# Patient Record
Sex: Female | Born: 1975 | Race: White | Hispanic: No | Marital: Single | Smoking: Former smoker
Health system: Southern US, Community
[De-identification: ages and names within clinical notes are randomized; demographics above are authoritative.]

## PROBLEM LIST (undated history)

## (undated) DIAGNOSIS — J45909 Unspecified asthma, uncomplicated: Secondary | ICD-10-CM

## (undated) DIAGNOSIS — M069 Rheumatoid arthritis, unspecified: Secondary | ICD-10-CM

## (undated) DIAGNOSIS — E785 Hyperlipidemia, unspecified: Secondary | ICD-10-CM

## (undated) DIAGNOSIS — I82409 Acute embolism and thrombosis of unspecified deep veins of unspecified lower extremity: Secondary | ICD-10-CM

## (undated) DIAGNOSIS — I509 Heart failure, unspecified: Secondary | ICD-10-CM

## (undated) DIAGNOSIS — E282 Polycystic ovarian syndrome: Secondary | ICD-10-CM

## (undated) DIAGNOSIS — E119 Type 2 diabetes mellitus without complications: Secondary | ICD-10-CM

## (undated) DIAGNOSIS — M1990 Unspecified osteoarthritis, unspecified site: Secondary | ICD-10-CM

## (undated) DIAGNOSIS — E0500 Thyrotoxicosis with diffuse goiter without thyrotoxic crisis or storm: Secondary | ICD-10-CM

## (undated) DIAGNOSIS — I10 Essential (primary) hypertension: Secondary | ICD-10-CM

## (undated) HISTORY — DX: Rheumatoid arthritis, unspecified: M06.9

## (undated) HISTORY — PX: LUMBAR FUSION: SHX111

## (undated) HISTORY — DX: Hyperlipidemia, unspecified: E78.5

## (undated) HISTORY — DX: Unspecified asthma, uncomplicated: J45.909

## (undated) HISTORY — DX: Essential (primary) hypertension: I10

## (undated) HISTORY — PX: TUBAL LIGATION: SHX77

---

## 1997-03-26 ENCOUNTER — Ambulatory Visit (HOSPITAL_COMMUNITY): Admission: RE | Admit: 1997-03-26 | Discharge: 1997-03-26 | Payer: Self-pay | Admitting: Obstetrics and Gynecology

## 1997-07-13 ENCOUNTER — Inpatient Hospital Stay (HOSPITAL_COMMUNITY): Admission: AD | Admit: 1997-07-13 | Discharge: 1997-07-13 | Payer: Self-pay | Admitting: Obstetrics and Gynecology

## 1997-07-20 ENCOUNTER — Other Ambulatory Visit: Admission: RE | Admit: 1997-07-20 | Discharge: 1997-07-20 | Payer: Self-pay | Admitting: Obstetrics and Gynecology

## 1997-12-06 ENCOUNTER — Other Ambulatory Visit: Admission: RE | Admit: 1997-12-06 | Discharge: 1997-12-06 | Payer: Self-pay | Admitting: Obstetrics and Gynecology

## 1998-05-07 ENCOUNTER — Inpatient Hospital Stay (HOSPITAL_COMMUNITY): Admission: AD | Admit: 1998-05-07 | Discharge: 1998-05-07 | Payer: Self-pay | Admitting: *Deleted

## 1998-06-29 ENCOUNTER — Other Ambulatory Visit: Admission: RE | Admit: 1998-06-29 | Discharge: 1998-06-29 | Payer: Self-pay | Admitting: Obstetrics and Gynecology

## 1998-09-20 ENCOUNTER — Other Ambulatory Visit: Admission: RE | Admit: 1998-09-20 | Discharge: 1998-09-20 | Payer: Self-pay | Admitting: Obstetrics and Gynecology

## 1998-12-03 ENCOUNTER — Emergency Department (HOSPITAL_COMMUNITY): Admission: EM | Admit: 1998-12-03 | Discharge: 1998-12-03 | Payer: Self-pay | Admitting: Emergency Medicine

## 1998-12-03 ENCOUNTER — Encounter: Payer: Self-pay | Admitting: *Deleted

## 1999-03-08 ENCOUNTER — Other Ambulatory Visit: Admission: RE | Admit: 1999-03-08 | Discharge: 1999-03-08 | Payer: Self-pay | Admitting: Obstetrics and Gynecology

## 1999-03-09 ENCOUNTER — Other Ambulatory Visit: Admission: RE | Admit: 1999-03-09 | Discharge: 1999-03-09 | Payer: Self-pay | Admitting: Obstetrics and Gynecology

## 1999-03-09 ENCOUNTER — Encounter (INDEPENDENT_AMBULATORY_CARE_PROVIDER_SITE_OTHER): Payer: Self-pay

## 1999-05-04 ENCOUNTER — Emergency Department (HOSPITAL_COMMUNITY): Admission: EM | Admit: 1999-05-04 | Discharge: 1999-05-04 | Payer: Self-pay | Admitting: Emergency Medicine

## 1999-05-17 ENCOUNTER — Other Ambulatory Visit: Admission: RE | Admit: 1999-05-17 | Discharge: 1999-05-17 | Payer: Self-pay | Admitting: Obstetrics and Gynecology

## 1999-07-27 ENCOUNTER — Emergency Department (HOSPITAL_COMMUNITY): Admission: EM | Admit: 1999-07-27 | Discharge: 1999-07-27 | Payer: Self-pay | Admitting: *Deleted

## 2000-02-03 ENCOUNTER — Encounter: Payer: Self-pay | Admitting: Emergency Medicine

## 2000-02-03 ENCOUNTER — Emergency Department (HOSPITAL_COMMUNITY): Admission: EM | Admit: 2000-02-03 | Discharge: 2000-02-03 | Payer: Self-pay | Admitting: Emergency Medicine

## 2000-02-05 ENCOUNTER — Other Ambulatory Visit: Admission: RE | Admit: 2000-02-05 | Discharge: 2000-02-05 | Payer: Self-pay | Admitting: Obstetrics and Gynecology

## 2000-05-17 ENCOUNTER — Other Ambulatory Visit: Admission: RE | Admit: 2000-05-17 | Discharge: 2000-05-17 | Payer: Self-pay | Admitting: Obstetrics and Gynecology

## 2000-06-26 ENCOUNTER — Encounter: Payer: Self-pay | Admitting: Physical Medicine and Rehabilitation

## 2000-06-26 ENCOUNTER — Encounter
Admission: RE | Admit: 2000-06-26 | Discharge: 2000-06-26 | Payer: Self-pay | Admitting: Physical Medicine and Rehabilitation

## 2000-10-09 ENCOUNTER — Ambulatory Visit (HOSPITAL_BASED_OUTPATIENT_CLINIC_OR_DEPARTMENT_OTHER): Admission: RE | Admit: 2000-10-09 | Discharge: 2000-10-09 | Payer: Self-pay | Admitting: *Deleted

## 2000-10-09 ENCOUNTER — Encounter (INDEPENDENT_AMBULATORY_CARE_PROVIDER_SITE_OTHER): Payer: Self-pay | Admitting: *Deleted

## 2000-10-24 ENCOUNTER — Encounter: Payer: Self-pay | Admitting: Specialist

## 2000-10-28 ENCOUNTER — Inpatient Hospital Stay (HOSPITAL_COMMUNITY): Admission: RE | Admit: 2000-10-28 | Discharge: 2000-10-31 | Payer: Self-pay | Admitting: Specialist

## 2000-10-28 ENCOUNTER — Encounter: Payer: Self-pay | Admitting: Specialist

## 2000-10-28 ENCOUNTER — Encounter (INDEPENDENT_AMBULATORY_CARE_PROVIDER_SITE_OTHER): Payer: Self-pay | Admitting: Specialist

## 2001-05-23 ENCOUNTER — Other Ambulatory Visit: Admission: RE | Admit: 2001-05-23 | Discharge: 2001-05-23 | Payer: Self-pay | Admitting: Obstetrics and Gynecology

## 2002-07-23 ENCOUNTER — Other Ambulatory Visit: Admission: RE | Admit: 2002-07-23 | Discharge: 2002-07-23 | Payer: Self-pay | Admitting: Obstetrics and Gynecology

## 2002-12-08 ENCOUNTER — Ambulatory Visit (HOSPITAL_COMMUNITY): Admission: RE | Admit: 2002-12-08 | Discharge: 2002-12-08 | Payer: Self-pay | Admitting: Family Medicine

## 2003-04-21 ENCOUNTER — Emergency Department (HOSPITAL_COMMUNITY): Admission: EM | Admit: 2003-04-21 | Discharge: 2003-04-21 | Payer: Self-pay | Admitting: Emergency Medicine

## 2003-04-29 ENCOUNTER — Other Ambulatory Visit: Admission: RE | Admit: 2003-04-29 | Discharge: 2003-04-29 | Payer: Self-pay | Admitting: Gynecology

## 2003-07-07 ENCOUNTER — Ambulatory Visit (HOSPITAL_COMMUNITY): Admission: RE | Admit: 2003-07-07 | Discharge: 2003-07-07 | Payer: Self-pay | Admitting: Gynecology

## 2003-09-10 ENCOUNTER — Inpatient Hospital Stay (HOSPITAL_COMMUNITY): Admission: AD | Admit: 2003-09-10 | Discharge: 2003-09-11 | Payer: Self-pay | Admitting: Gynecology

## 2003-10-08 ENCOUNTER — Encounter: Admission: RE | Admit: 2003-10-08 | Discharge: 2003-10-08 | Payer: Self-pay | Admitting: Gynecology

## 2003-11-15 ENCOUNTER — Encounter (INDEPENDENT_AMBULATORY_CARE_PROVIDER_SITE_OTHER): Payer: Self-pay | Admitting: Specialist

## 2003-11-15 ENCOUNTER — Inpatient Hospital Stay (HOSPITAL_COMMUNITY): Admission: RE | Admit: 2003-11-15 | Discharge: 2003-11-18 | Payer: Self-pay | Admitting: Gynecology

## 2003-12-29 ENCOUNTER — Other Ambulatory Visit: Admission: RE | Admit: 2003-12-29 | Discharge: 2003-12-29 | Payer: Self-pay | Admitting: Gynecology

## 2005-06-26 ENCOUNTER — Other Ambulatory Visit: Admission: RE | Admit: 2005-06-26 | Discharge: 2005-06-26 | Payer: Self-pay | Admitting: Gynecology

## 2005-12-03 ENCOUNTER — Ambulatory Visit (HOSPITAL_COMMUNITY): Admission: RE | Admit: 2005-12-03 | Discharge: 2005-12-03 | Payer: Self-pay | Admitting: Obstetrics and Gynecology

## 2005-12-06 ENCOUNTER — Ambulatory Visit (HOSPITAL_COMMUNITY): Admission: RE | Admit: 2005-12-06 | Discharge: 2005-12-06 | Payer: Self-pay | Admitting: Obstetrics and Gynecology

## 2005-12-10 ENCOUNTER — Ambulatory Visit (HOSPITAL_COMMUNITY): Admission: RE | Admit: 2005-12-10 | Discharge: 2005-12-10 | Payer: Self-pay | Admitting: Obstetrics and Gynecology

## 2005-12-17 ENCOUNTER — Ambulatory Visit (HOSPITAL_COMMUNITY): Admission: RE | Admit: 2005-12-17 | Discharge: 2005-12-17 | Payer: Self-pay | Admitting: Obstetrics and Gynecology

## 2005-12-20 ENCOUNTER — Ambulatory Visit (HOSPITAL_COMMUNITY): Admission: RE | Admit: 2005-12-20 | Discharge: 2005-12-20 | Payer: Self-pay | Admitting: Obstetrics and Gynecology

## 2005-12-24 ENCOUNTER — Ambulatory Visit (HOSPITAL_COMMUNITY): Admission: RE | Admit: 2005-12-24 | Discharge: 2005-12-24 | Payer: Self-pay | Admitting: Obstetrics and Gynecology

## 2005-12-27 ENCOUNTER — Ambulatory Visit (HOSPITAL_COMMUNITY): Admission: RE | Admit: 2005-12-27 | Discharge: 2005-12-27 | Payer: Self-pay | Admitting: Obstetrics and Gynecology

## 2005-12-31 ENCOUNTER — Ambulatory Visit (HOSPITAL_COMMUNITY): Admission: RE | Admit: 2005-12-31 | Discharge: 2005-12-31 | Payer: Self-pay | Admitting: Obstetrics and Gynecology

## 2006-01-03 ENCOUNTER — Ambulatory Visit (HOSPITAL_COMMUNITY): Admission: RE | Admit: 2006-01-03 | Discharge: 2006-01-03 | Payer: Self-pay | Admitting: Obstetrics and Gynecology

## 2006-01-09 ENCOUNTER — Encounter (INDEPENDENT_AMBULATORY_CARE_PROVIDER_SITE_OTHER): Payer: Self-pay | Admitting: *Deleted

## 2006-01-09 ENCOUNTER — Inpatient Hospital Stay (HOSPITAL_COMMUNITY): Admission: RE | Admit: 2006-01-09 | Discharge: 2006-01-11 | Payer: Self-pay | Admitting: Obstetrics and Gynecology

## 2006-01-14 ENCOUNTER — Inpatient Hospital Stay (HOSPITAL_COMMUNITY): Admission: AD | Admit: 2006-01-14 | Discharge: 2006-01-14 | Payer: Self-pay | Admitting: Obstetrics and Gynecology

## 2007-11-10 ENCOUNTER — Emergency Department (HOSPITAL_COMMUNITY): Admission: EM | Admit: 2007-11-10 | Discharge: 2007-11-10 | Payer: Self-pay | Admitting: Emergency Medicine

## 2010-01-09 ENCOUNTER — Emergency Department (HOSPITAL_COMMUNITY)
Admission: EM | Admit: 2010-01-09 | Discharge: 2010-01-09 | Payer: Self-pay | Source: Home / Self Care | Admitting: Emergency Medicine

## 2010-02-12 ENCOUNTER — Encounter: Payer: Self-pay | Admitting: Obstetrics and Gynecology

## 2010-02-12 ENCOUNTER — Encounter: Payer: Self-pay | Admitting: Gynecology

## 2010-02-12 ENCOUNTER — Encounter: Payer: Self-pay | Admitting: Physical Medicine and Rehabilitation

## 2010-04-03 LAB — URINALYSIS, ROUTINE W REFLEX MICROSCOPIC
Hgb urine dipstick: NEGATIVE
Ketones, ur: NEGATIVE mg/dL
Nitrite: NEGATIVE
Protein, ur: NEGATIVE mg/dL
Specific Gravity, Urine: 1.02 (ref 1.005–1.030)
Urobilinogen, UA: 1 mg/dL (ref 0.0–1.0)

## 2010-04-03 LAB — BASIC METABOLIC PANEL
CO2: 25 mEq/L (ref 19–32)
Calcium: 9.1 mg/dL (ref 8.4–10.5)
Creatinine, Ser: 0.64 mg/dL (ref 0.4–1.2)
GFR calc Af Amer: >60 mL/min (ref >60)
GFR calc non Af Amer: >60 mL/min (ref >60)
Glucose, Bld: 75 mg/dL (ref 70–99)
Sodium: 140 mEq/L (ref 135–145)

## 2010-04-03 LAB — DIFFERENTIAL
Basophils Absolute: 0 10*3/uL (ref 0.0–0.1)
Basophils Relative: 0 % (ref 0–1)
Eosinophils Absolute: 0.2 10*3/uL (ref 0.0–0.7)
Eosinophils Relative: 3 % (ref 0–5)
Lymphocytes Relative: 31 % (ref 12–46)
Monocytes Relative: 9 % (ref 3–12)
Neutro Abs: 4.3 10*3/uL (ref 1.7–7.7)
Neutrophils Relative %: 57 % (ref 43–77)

## 2010-04-03 LAB — PREGNANCY, URINE: Preg Test, Ur: NEGATIVE

## 2010-04-03 LAB — CBC
Hemoglobin: 11.8 g/dL — ABNORMAL LOW (ref 12.0–15.0)
MCHC: 32 g/dL (ref 30.0–36.0)
MCV: 84.6 fL (ref 78.0–100.0)
Platelets: 314 10*3/uL (ref 150–400)
RDW: 14.3 % (ref 11.5–15.5)

## 2010-04-29 ENCOUNTER — Emergency Department (HOSPITAL_COMMUNITY): Payer: PRIVATE HEALTH INSURANCE

## 2010-04-29 ENCOUNTER — Emergency Department (HOSPITAL_COMMUNITY)
Admission: EM | Admit: 2010-04-29 | Discharge: 2010-04-30 | Disposition: A | Discharge status: Home / Self Care | Payer: PRIVATE HEALTH INSURANCE | Attending: Emergency Medicine | Admitting: Emergency Medicine

## 2010-04-29 DIAGNOSIS — R0602 Shortness of breath: Secondary | ICD-10-CM | POA: Insufficient documentation

## 2010-04-29 DIAGNOSIS — R059 Cough, unspecified: Secondary | ICD-10-CM | POA: Insufficient documentation

## 2010-04-29 DIAGNOSIS — Z86718 Personal history of other venous thrombosis and embolism: Secondary | ICD-10-CM | POA: Insufficient documentation

## 2010-04-29 DIAGNOSIS — R509 Fever, unspecified: Secondary | ICD-10-CM | POA: Insufficient documentation

## 2010-04-29 DIAGNOSIS — J3489 Other specified disorders of nose and nasal sinuses: Secondary | ICD-10-CM | POA: Insufficient documentation

## 2010-04-29 DIAGNOSIS — J40 Bronchitis, not specified as acute or chronic: Secondary | ICD-10-CM | POA: Insufficient documentation

## 2010-04-29 DIAGNOSIS — R062 Wheezing: Secondary | ICD-10-CM | POA: Insufficient documentation

## 2010-04-29 DIAGNOSIS — R0601 Orthopnea: Secondary | ICD-10-CM | POA: Insufficient documentation

## 2010-04-29 DIAGNOSIS — R05 Cough: Secondary | ICD-10-CM | POA: Insufficient documentation

## 2010-04-29 LAB — POCT I-STAT, CHEM 8
Calcium, Ion: 0.91 mmol/L — ABNORMAL LOW (ref 1.12–1.32)
HCT: 36 % (ref 36.0–46.0)
Hemoglobin: 12.2 g/dL (ref 12.0–15.0)
Potassium: 4.3 mEq/L (ref 3.5–5.1)
Sodium: 136 mEq/L (ref 135–145)
TCO2: 18 mmol/L (ref 0–100)

## 2010-06-09 NOTE — H&P (Signed)
Melissa Massey, Melissa Massey             ACCOUNT NO.:  [id number]   MEDICAL RECORD NO.:  [id number]          PATIENT TYPE:  OUT   LOCATION:  MFM                           [facility]  [facility]   PHYSICIAN:  Charles A. Delcambre, MDDATE OF BIRTH:  1975/09/12   DATE OF ADMISSION:  12/24/2005  DATE OF DISCHARGE:                              HISTORY & PHYSICAL   Former last name, Melissa Massey, for this medical record number [id number].   This patient is to be admitted on January 09, 2006, to undergo a repeat  cesarean section and bilateral tubal ligation.  She is a 35 year old  para 1-0-0-1.  EDC of January 16, 2006.  She will be at 39 weeks.  She  gives informed consent, accepts risk of infection, bleeding, bowel and  bladder damage, blood product risk including hepatitis and HIV exposure,  ureteral damage, failure rate approximately 1 in 400, for the tubal  ligation alternative forms of contraception have been discussed.   PAST MEDICAL HISTORY:  1. Positive for lupus anticoagulant.  2. Anemia.   SURGICAL HISTORY:  1. Laparoscopy.  2. Cryotherapy.  3. Low-transverse cesarean section.  4. Spinal fusion in the lumbar region L4-L6.   MEDICATIONS:  Iron once a day, baby aspirin once a day.   ALLERGIES:  NO KNOWN DRUG ALLERGIES.   SOCIAL HISTORY:  No tobacco, ethanol, or drug use.  The patient is  married and in a monogamous relationship with her husband.   FAMILY HISTORY:  Heart disease paternal grandmother, paternal  grandfathe, grandfather thyroid dysfunction.  Otherwise no major  illnesses.   REVIEW OF SYSTEMS:  Denies fever, chills, nausea, vomiting, rupture of  membranes, or bleeding.  She notes active fetal movement.  No  contractions at this time.   PHYSICAL EXAMINATION:  GENERAL:  Alert and oriented x3.  No distress.  VITAL SIGNS:  Blood pressure 128/70, respirations 18, pulse 90, weight  274 pounds.  HEENT:  Grossly within normal limits.  CORONARY:  Regular rate and rhythm.  A 2/6  systolic ejection murmur left  sternal border.  LUNGS:  Clear.  ABDOMEN:  Obese.  Fundal height 39-cm at 37 weeks.  Fetal heart rate  145.  PELVIC:  Deferred.  EXTREMITIES:  Nontender.   ASSESSMENT:  The patient to be admitted at 39 weeks to undergo a repeat  cesarean section and tubal ligation for sterilization request and with a  history of cesarean section.   PLAN:  1. NPO past midnight evening prior to surgery.  2. Preoperative CBC and type and screen.  3. Informed consent as noted above.  4. We will proceed as outlined.      Charles A. Sydnee Cabal, MD  Electronically Signed     CAD/MEDQ  D:  12/25/2005  T:  12/25/2005  Job:  [id number]

## 2010-06-09 NOTE — Op Note (Signed)
Melissa Massey, Melissa Massey             ACCOUNT NO.:  [id number]   MEDICAL RECORD NO.:  [id number]          PATIENT TYPE:  OUT   LOCATION:  MFM                           [facility]   PHYSICIAN:  Charles A. Delcambre, MDDATE OF BIRTH:  Jan 28, 1975   DATE OF PROCEDURE:  01/09/2006  DATE OF DISCHARGE:                               OPERATIVE REPORT   PREOPERATIVE DIAGNOSIS:  1. Previous stent cesarean section.  2. Desired repeat cesarean section.  3. Undesired fertility.  4. Desires sterilization.   PROCEDURE:  1. Repeat low transverse section.  2. Modified [place] bilateral tubal ligation.   SURGEON:  Charles A. Sydnee Cabal, M.D.   ASSISTANT:  Gerald Leitz, M.D.   COMPLICATIONS:  None.   BLOOD LOSS:  800 mL.   ANESTHESIA:  Spinal.   SPECIMEN:  1. Portion of right left fallopian tubes to pathology.  2. Placenta to pathology secondary to positive lupus anticoagulant      history.   FINDINGS:  Vigorous female, Apgars 9 and 9.  Normal-appearing uterus and  placenta, uterus and the tubes and ovaries and placenta grossly.  Instrument, sponge, needle count correct x2.   DESCRIPTION OF PROCEDURE:  The patient was taken to the operating room  and placed supine position. After spinal was induced and anesthesia was  adequate, sterile prep and drape was undertaken. A sterile incision was  made with not carried down to fascia.  Fascia was incised with a knife  and Mayo scissors. Peritoneum was entered at this time without damage to  surrounding structures and opening the fascia.  Fascia was taken  superiorly carefully without damage to surrounding structures. There was  noted to be omental adhesions to the anterior abdominal wall and these  were taken down sharply and then tied with 0 Vicryl ties without damage  to surrounding structures.  Hemostasis was good.  This allowed better  exposure down to the lower uterine segment and the fascia was taken  inferiorly as well. Bladder was separated  with the rectus muscles  diastased and there was no damage to the bladder noted.  Bladder blade  was placed.  The vesicouterine peritoneum was incised with the  Metzenbaum scissors.  Sharp dissection with Metzenbaum scissors was used  to take the bladder down to the bladder flap. Bladder blade was  replaced.  Lower uterine segment transverse incision was made to  amniotomy without damage to infant.  Traction was used to extend the  incision. Clear amniotic fluid was noted. Head was noted to be obliquely  over to the right and pressure was used to push head down.  Vacuum  extractor was used to assist delivery easily with fundal pressure by the  operator's assistant. The baby was delivered without further difficulty  and cord was clamped and infant was shown to parents and handed off to  neonatologist, Dr. Eric Form, who was in attendance.  Placenta was manually  expressed and sent off for cord blood to be drawn and to go to  pathology.  The lower uterine segment was closed with a single layer  with #1 chromic and there was very  thin lower uterine segment.  This was  closed further with transverse interrupted figure-of-eight sutures  without encroaching on the bladder to close this very thin area inferior  to the incision. Hemostasis was adequate and the defect was closed  adequately. No evidence of damage to the bladder was noted again and  sutures did not encroach upon the bladder.  Fallopian tubes were  isolated and clearly seen on the left and right mid segment. The isthmic  and ampullary portion was dilated doubly ligated with 0 plain gut and  tubal segment was excised.  Hemostasis was adequate on both sides and  these segments were sent separately to pathology.  The pedicles were  verified to be of good hemostasis.  Irrigation was carried out and the  uterine incision was with good hemostasis. 0 Vicryl was used to close  the rectus muscles to the midline from the diastases in two simple   stitches.  Subfascial hemostasis was good. Fascia was closed with #1  Vicryl running nonlocking suture.  Subcutaneous hemostasis was  excellent.  Irrigation was carried out. Sterile skin clips were used to  close the skin.  Sterile dressing was applied. The patient tolerated  procedure well and was taken to recovery with physician in attendance.      Charles A. Sydnee Cabal, MD  Electronically Signed     CAD/MEDQ  D:  01/09/2006  T:  01/09/2006  Job:  [id number]

## 2010-06-09 NOTE — Op Note (Signed)
NAMEKRYSTALLE, Melissa Massey          ACCOUNT NO.:  [id number]   MEDICAL RECORD NO.:  [id number]          PATIENT TYPE:  INP   LOCATION:  9125                          [facility]ILITY:  [facility]   PHYSICIAN:  Ivor Costa. Farrel Gobble, M.D. DATE OF BIRTH:  01/05/1976   DATE OF PROCEDURE:  11/15/2003  DATE OF DISCHARGE:                                 OPERATIVE REPORT   PREOPERATIVE DIAGNOSES:  39 week intrauterine pregnancy, positive lupus  anticoagulant, history of spinal fusion at the lumbar level.   POSTOPERATIVE DIAGNOSES:  39 week intrauterine pregnancy, positive lupus  anticoagulant, history of spinal fusion at the lumbar level.   PROCEDURE:  Elective primary cesarean section, low flap transverse.   SURGEON:  Ivor Costa. Farrel Gobble, M.D.   ASSISTANT:  Gaetano Hawthorne. Lily Peer, M.D.   ANESTHESIA:  Epidural, spinal.   IV FLUIDS:  3500 mL of lactated Ringer's solution.   ESTIMATED BLOOD LOSS:  450 mL.   FINDINGS:  Viable female in the vertex presentation, clear amniotic fluid.  Apgar's 5, 8. Birth weight 7 pounds 9 ounces, normal uterus, tubes and  ovaries.   COMPLICATIONS:  None.   PATHOLOGY:  Placenta.   DESCRIPTION OF PROCEDURE:  The patient was taken to the operating room,  epidural and then spinal anesthesia was placed and once adequate anesthesia  was ensured prepped and draped in the usual sterile fashion. A Pfannenstiel  skin incision was made with the scalpel and carried through the underlying  layering of fascia with electrocautery. The fascia was scored in the midline  and inferior aspect of the fascial incision was grasped with Kocher's and  underlying rectus muscles were dissected off by blunt and sharp dissection.  In a similar fashion, the superior aspect of the incision was grasped with  Kocher's and underlying rectus muscles were dissected off. The rectus  muscles were separated in the midline, the peritoneum was identified and  entered bluntly. The peritoneal incision was then extended  superiorly and  inferiorly with good visualization of the underlying bowel and bladder.  The  bladder blade was inserted, the vesicouterine perineum was identified,  tinted up and entered sharply with the Metzenbaum's. The incision was  extended laterally, the bladder flap was created digitally, the bladder  blade was then reinserted and the lower uterine segment was incised in a  transverse fashion with the scalpel. He was noted to be markedly thin. The  incision was extended bluntly and amniotomy was performed for copious  amounts of clear amniotic fluid. The baby was delivered with the aid of baby  Elliot's, cord was clamped and cut and handed off to the waiting  pediatrician, the loose nuchal cord was reduced during delivery.  Cord  bloods were obtained, the uterus was massaged, the placenta was allowed to  separate naturally. The uterus was then cleared of all clots and debris. The  uterine incision was repaired with a running locked layer of #0 chromic and  noted to be hemostatic afterwards.  The pelvis was irrigated with copious  amounts of warm saline. Reinspection of the incision, peritoneum, muscles  and fascia assured [place] of hemostasis. The fascia was  then closed with #0  Vicryl in a running fashion. The subcu was irrigated, reapproximated with 3-  0 plain, the skin was closed with 4-0 Vicryl on the [place] followed by Sinda Du-  Strips. The patient tolerated the procedure well. Sponge and sharp needle  counts were correct x2 and she was transferred to the PACU in stable  condition.     Trac   THL/MEDQ  D:  11/15/2003  T:  11/15/2003  Job:  [id number]

## 2010-06-09 NOTE — Discharge Summary (Signed)
[facility]  Patient:    Melissa Massey, Melissa Massey Visit Number: [id number] MRN: [id number]          Service Type: SUR Location: 4W 0462 01 Attending Physician:  Pierce Crane Dictated by:   Ralene Bathe, P.A.-C Admit Date:  10/28/2000 Discharge Date: 10/31/2000                             Discharge Summary  ADMISSION DIAGNOSES: 1. 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 2. Recent smoking cessation. 3. Polycystic ovary disease.  DISCHARGE DIAGNOSES: 1. 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 2. Recent smoking cessation. 3. Polycystic ovary disease. 4. Status post posterior lumbar interbody fusion with microdiskectomy at L4-5.  CONSULTATIONS:  None.  OPERATIONS:  Microdiskectomy at L4-5, posterior lumbar interbody fusion utilizing Brantagen cages, posterolateral lumbar fusion utilizing Depuy acromed pedicle screws and rods with local augmented allomatrix bone graft.  SURGEON:  Javier Docker, M.D.  ASSISTANT:  Sharolyn Douglas, M.D.  ANESTHESIA:  General.  HISTORY OF PRESENT ILLNESS:  This is a 35 year old with disk herniation at L4-5, has had a long history of severe mechanical back pain which has been disabling.  She has the above noted diagnoses per her MRI.  She has failed conservative measures, including epidural steroids, as well as therapy and NSAIDS.  At this time it is felt that fusion of the L4-5 disk space is appropriate due to her long-standing mechanical back pain refractory to conservative management.  Risks and benefits were discussed with the patient. She is in agreement and wishes to proceed.  HOSPITAL COURSE:  The patient is admitted, underwent the above named procedure, and tolerated this well.  All appropriate IV antibiotics and analgesics were utilized.  GI precautions were instituted postoperatively due to the lumbar fusion.  A tearback brace was ordered, and this  was applied when out of bed.  The patient was able to be weaned off the Foley and IV analgesics by postoperative day #2.  She tolerated being up with therapy ambulating. Ativan was instituted for smoking cessation.  All in all, the patient remained medically and orthopedically stable throughout her hospital course.  She reached all of her discharge therapy goals.  On date 10/31/00, she was afebrile, vital signs were stable.  She had a bowel movement.  She was neurovascularly intact bilateral lower extremities.  With relief of her leg pain.  Her back incision was dry.  No drainage.  At this time she was stable for discharge to home.  LABORATORY DATA:  A culture taken intraoperatively with no organisms.  CBC postoperatively was stable with hemoglobin of 11.7, white count 11.5, hemoglobin preoperatively was 15.3.  Urinalysis on admission showed trace ketones, otherwise normal.  Blood type was O+.  Surgical pathology shows disk fragment.  Radiology postoperatively shows a L4-5 fusion.  Chest x-ray preoperatively shows no active disease on 10/24/00.  Cardiology not done due to the patients age.  CONDITION ON DISCHARGE:  Stable and improved.  DISCHARGE PLANS: 1. The patient is being discharged to home.  She will wear the brace when up.    She may sit to ______ brace. 2. She may shower tomorrow in the brace.  DISCHARGE MEDICATIONS: 1. Percocet 5/325 mg #50 one or two q.4-6h. p.r.n. pain. 2. Robaxin 500 mg #30 one q.8h. p.r.n. spasm. 3. Ativan 0.5 mg #61 t.i.d. p.r.n. anxiety. 4. Resume home medications.  Avoid NSAIDS, avoid smoking.  Use suppository or stool softener at home p.r.n.  WOUND CARE:  Dry dressing change daily.  DIET:  Resume home diet. Dictated by:   Ralene Bathe, P.A.-C Attending Physician:  Pierce Crane DD:  10/31/00 TD:  10/31/00 Job: [id number]

## 2010-06-09 NOTE — Op Note (Signed)
[facility]  Patient:    Melissa Massey, Melissa Massey Visit Number: [id number] MRN: [id number]          Service Type: SUR Location: 4W 0462 01 Attending Physician:  Pierce Crane Dictated by:   Javier Docker, M.D. Proc. Date: 10/28/00 Admit Date:  10/28/2000                             Operative Report  PREOPERATIVE DIAGNOSIS:  Herniated nucleus pulposus of L4-5 and degenerative disk disease, L4-5.  POSTOPERATIVE DIAGNOSIS:  Herniated nucleus pulposus of L4-5 and degenerative disk disease, L4-5.  PROCEDURE PERFORMED:  Microdiskectomy of L4-5, posterior lumbar interbody fusion utilizing Brantigan cages, posterolateral lumbar fusion utilizing Depuy AcroMed pedicle screws and rods with local augmented with AlloMatrix ______ bone graft.  ANESTHESIA:  General.  SURGEON:  Javier Docker, M.D.  ASSISTANT:  _______.  BRIEF HISTORY AND INDICATIONS:  A 35 year old with disk herniation at 4-5. Had a long history of severe mechanical back pain, disabling.  Modic changes on her MRI.  Operative intervention was indicated for diskectomy and at the procedure, it was felt that fusion of the 4-5 disk would be appropriate due to longstanding mechanical back pain refractory to conservative treatment.  Risks and benefits were discussed including bleeding, infection, damage to vascular structures, CSF leakage, epidural fibrosis, need for adjacent segment pathology in the future, pseudoarthrosis, etc.  DESCRIPTION OF PROCEDURE:  With the patient in the supine position and after satisfactory induction of general anesthesia and 1 g of Kefzol, she was placed prone on the Wilson frame.  All bony prominences were well-padded.  The lumbar region was prepped and draped in the usual sterile fashion.  An incision was made at the spinous process of 3 to S1.  The subcutaneous tissue was dissected and electrocautery was utilized to achieve hemostasis.  The dorsal  lumbar fascia was then divided in line with the skin incision.  A Kocher was placed on the spinous process of 4 and 5, confirmed with x-ray.  Next, the paraspinous muscle was elevated from the spinous process in the lamina of 4, 5, 3, and S1.  A McCullough retractor was then placed.  The interspinous ligament was removed at 4-5.  The lamina and spinous process at 4 and 5 were skeletonized, and central decompression at 4-5 was performed, morcellizing the cancellous bone of the spinous process as well as that of the facets.  This was saved later for bone grafting.  Following the central decompression, the operating microscope was draped and brought into the surgical field.  The nerve root of 5 was gently mobilized medially and noted to be under significant tension.  Disk herniation was noted and had migrated cephalad, and there was a large fragment that had travelled cephalad beneath the 4 root up to the pedicle behind the body of 4.  This retrieved multiple fragments utilizing a combination of the nerve hook and the micropituitary.  Following retrieval of multiple fragments, the root of 4 and 5 were found to be free without evidence of compression.  Foraminotomies of 5 were performed bilaterally.  Next, with the neural elements well-retracted, an annulotomy was performed bilaterally in a rectangular fashion.  Thorough diskectomy was performed. With a spacer placed on one side at a 9.  The contralateral side was then spaced, scraped to a 9 mm utilizing the 10 scraper.  Pituitary utilized to remove the cartilage over the endplate.  A thorough  diskectomy was performed. This was done under x-ray as well.  Next, the endplates were found to be fully removed of cartilage.  The disk space was copiously irrigated.  This was also cultured due to the history of Modic changes.  Culture of Gram stain was negative.  A 9 x 9 x 21 Brantigan cage was then packed with cancellous bone as well as  augmented with ________ AlloMatrix bone graft.  This was packed into the center and the cages packed into the disk space under x-ray in satisfactory position.  The spacers were removed from the contralateral side and the cage was placed here in a similar fashion with excellent purchase.  The set again sized optimally to a 9. Bipolar electrocautery was utilized to achieve hemostasis.  Following this, the transverse processes of 4 and 5 were exposed.  The 4-5 facet was decapsulated and burred.  The pars bilaterally was burred, lamina of 4 and 5.  Next, an awl was utilized to find the pedicles of 4 and 5 at the junction of the midportion of the transverse processes and the superior facet.  This was done under x-ray.  The awl was entered, the pedicle finder utilized at appropriate conversions of 4 and 5, determined by radiographic appearance of the pedicles.  This was advanced.  A ball-tipped probe was then utilized.  The bone felt in each and all quadrants.  Measured at 35 mm of depth at 4, and 40 mm of depth at 5.  Pedicle screws were inserted after the appropriate tapping and five pedicle screws were inserted.  Excellent purchase obtained on AP and lateral.  Excellent conversions down through the pedicle as well as on the lateral.  Following this, the single rods were then placed, affixed with the set screw, and torqued to the appropriate torque.  Prior to torquing, the rods compressed on both sides.  Next, the wound was copiously irrigated.  The bone graft which was the bone putty mixed with cancellous bone was impacted into the gutters along the pars bilaterally.  Next, following this, the canal was reexamined.  The foramina of 4 and 5 was inspected.  No evidence of residual material.  There was no active bleeding. A hockey stick probe was placed, and 4 and 5 were found to be widely patent. The wound was copiously irrigated again.  Interbody cages were checked as well and they were  found to be in satisfactory position.  No evidence of active bleeding or CSF leakage.  Thrombin soaked Gelfoam was placed in the laminotomy defect.   Next, the retractors were removed, paraspinous muscles inspected, and no evidence of any active bleeding, and the area copiously irrigated.  The paraspinous musculature was reapproximated with 0 Vicryl simple sutures.  The Hemovac was placed and brought out through a distal stab wound in the skin. The dorsal lumbar fascia was reapproximated with multiple #1 Vicryl interrupted figure-of-eight stitches to a watertight closure.  The subcutaneous tissue was reapproximated with 0 and 2-0 Vicryl simple sutures. The skin was reapproximated with staples.  The wound was dressed sterilely.  The patient was placed supine on the hospital bed, extubated without difficulty, and transported to the recovery room in satisfactory condition. The patient tolerated the procedure well with no complications.  Estimated blood loss was 400 cc. Dictated by:   Javier Docker, M.D. Attending Physician:  Pierce Crane DD:  10/28/00 TD:  10/29/00 Job: [id number]

## 2010-06-09 NOTE — H&P (Signed)
NAMESHAYDA, KALKA          ACCOUNT NO.:  [id number]   MEDICAL RECORD NO.:  [id number]          PATIENT TYPE:  INP   LOCATION:  NA                            [facility]ACILITY[facility]  [facility]   PHYSICIAN:  Ivor Costa. Farrel Gobble, M.D. DATE OF BIRTH:  08/27/1975   DATE OF ADMISSION:  DATE OF DISCHARGE:                                HISTORY & PHYSICAL   CHIEF COMPLAINT:  A 39-week pregnancy for elective cesarean section.   HISTORY OF PRESENT ILLNESS:  The patient is a 35 year old G1 with a last  menstrual period of February 02, 2003, estimated date of confinement based on  a six-week ultrasound is November 22, 2003.  Estimated gestational age is 60  weeks who presents for an elective primary cesarean section secondary to a  history of a lumbar fusion from L4-S1.  After consultation with the  patient's orthopedic surgeon, he had concerns with regard to damaging the  fusion or further rupturing disks either above or below the fusion with  labor more importantly with pushing.  The patient therefore requested a  primary cesarean section.  Her pregnancy was also complicated by a false  positive RPR for which the patient was found to have a positive lupus  anticoagulant and therefore was treated with baby aspirin.  Antepartum  testing was also begun in the third trimester in the form of NST's and  AFI's, all of which have been assuring.  The patient reports good fetal  movement, no vaginal bleeding and no contractions.  She is O+, antibody  negative, RPR reactive as a false positive, Rubella immune, hepatitis B  surface antigen nonreactive, HIV nonreactive, GBS negative.  Refer to the  [facility].   PHYSICAL EXAMINATION:  GENERAL:  On physical examination, she is a well-  appearing, gravid, in no acute distress.  Her weight is 250, which is a 13  pound weight gain.  VITAL SIGNS:  Her blood pressure is 112/70.  HEART:  Her heart was a regular rate.  LUNGS:  Clear to auscultation.  ABDOMEN:  Gravid, soft,  nontender with heart tones auscultated.  VAGINA:  Vaginal exam done the week prior was long, closed and posterior.  EXTREMITIES:  Negative.  NST's were reactive.   The patient will present on the afternoon of November 15, 2003 for cesarean  section as mentioned above.     Trac   THL/MEDQ  D:  11/15/2003  T:  11/15/2003  Job:  [id number]

## 2010-06-09 NOTE — H&P (Signed)
[place]. [facility]  Patient:    Melissa Massey, Melissa Massey Visit Number: [id number] MRN: [id number]          Service Type: Attending:  Javier Docker, M.D. Dictated by:   Dorie Rank, P.A.-C. Adm. Date:  10/28/00   CC:         Stacie Acres. White, M.D. at [facility]   History and Physical  DATE OF BIRTH:  [birth year]  CHIEF COMPLAINT:  Low back pain.  HISTORY OF PRESENT ILLNESS:  Melissa Massey is a pleasant 35 year old female who has had an ongoing history of low back pain.  It has failed conservative treatment, including nonsteroidals, physical therapy, and epidural steroid injections.  She had an MRI on October 18, 2000 which revealed a right paracentral disk herniation at L4-L5 with a large extruded fragment migrated cephalad up behind the L4 vertebral body with significant mass effect on the thecal sac.  It was to the point to where it was interfering with her activities of daily living.  It was felt she would benefit from undergoing a microdiskectomy at L4-5 with posterolateral lumbar fusion.  The risks and benefits as well as the procedure were discussed with her in detail and she elected to proceed.  PAST MEDICAL HISTORY:  Significant for polycystic ovary disease.  She has had a recent lipoma removal to her right leg and upper back.  History of degenerative disk disease.  FAMILY MEDICAL DOCTOR:  Dr. Laurann Montana.  MEDICATIONS:  Percocet, Robaxin, birth control pills, Ativan p.r.n. anxiety while discontinuation of smoking.  ALLERGIES:  TROVAN.  FAMILY HISTORY:  Mother and father are both living and healthy.  She has a sister who was deceased at age 47 months with a history of a neuroblastoma. She has a brother who is autistic.  SURGICAL HISTORY:  Laparoscopy in March 2000.  September 2002 lipoma removal to the right leg and right upper back.  SOCIAL HISTORY:  She is divorced.  She is a smoker, approximately one pack per day;  however, she is currently trying to stop.  Social alcohol consumption. She plans to stay at home with her mother after her hospital stay.  She has not donated any autologous blood for this procedure.  She is employed at [employer].  REVIEW OF SYMPTOMS:  GENERAL:  No fevers, chills, night sweats, or bleeding tendencies.  PULMONARY:  No shortness of breath, productive cough, or hemoptysis.  ENDOCRINE:  No history of hyper or hypothyroidism.  No history of diabetes mellitus.  CARDIOVASCULAR:  No chest pain, angina, or orthopnea.  GU: No dysuria, hematuria, or discharge. GI:  Constipation from current pain medications.  No melena, hematemesis, diarrhea, or nausea or vomiting. NEUROLOGIC:  No seizures, headaches, or paralysis.  PHYSICAL EXAMINATION:  GENERAL:  Well-developed, well-nourished pleasant 35 year old white female, alert and oriented.  Accompanied by her mother today.  VITAL SIGNS:  Pulse 78, respirations 18, blood pressure 120/80.  HEENT:  Head is atraumatic, normocephalic.  Oropharynx is clear.  NECK:  Supple.  Negative for carotid bruits bilaterally.  No cervical lymphadenopathy appreciated on exam today.  CHEST:  She has some expiratory wheezes to the bilateral lower lobes that clears with coughing.  BREASTS:  Not pertinent to present illness.  HEART:  S1, S2.  Negative for murmur, rub, or gallop.  Heart has a regular rate and rhythm.  ABDOMEN:  Soft, nontender.  Positive bowel sounds.  GENITOURINARY:  Not pertinent to present illness.  EXTREMITIES:  She does  walk with an antalgic gait.  She has decreased range of motion with flexion-extension and lateral bending.  SKIN:  Acyanotic.  No rashes or lesions appreciated on exam.  LABORATORY DATA:  Preoperative labs and x-rays are pending at this point.  IMPRESSION:  Herniated nucleus pulposus L4-L5, degenerative disk disease.  PLAN:  The patient is scheduled for a microdiskectomy at L4-5  with posterolateral lumbar fusion with pedicle screws and interbody spacers and local versus ICBG. Dictated by:   Dorie Rank, P.A.-C. Attending:  Javier Docker, M.D. DD:  10/24/00 TD:  10/24/00 Job: [id number]

## 2010-06-09 NOTE — Op Note (Signed)
[place]. [facility]  Patient:    Melissa Massey, Melissa Massey Visit Number: [id number] MRN: [id number]          Service Type: DSU Location: [facility] Attending Physician:  Vikki Ports Dictated by:   Earna Coder, M.D. Proc. Date: 10/09/00 Admit Date:  10/09/2000                             Operative Report  PREOPERATIVE DIAGNOSIS:  Subcutaneous back mass x 3 and right thigh mass.  POSTOPERATIVE DIAGNOSIS:  Subcutaneous back mass x 3 and right thigh mass, lipomas.  OPERATION PERFORMED:  Excision of back lipoma x 3 and right thigh lipoma.  SURGEON:  Stephenie Acres, M.D.  ANESTHESIA:  MAC.  DESCRIPTION OF PROCEDURE:  The patient was taken to the operating room and placed in supine position.  After adequate MAC anesthesia was induced the right thigh was prepped and draped in normal sterile fashion.  Using 1% lidocaine with bicarb, the skin and subcutaneous tissues overlying the mass were anesthetized.  A small transverse incision was made over the mass, dissected down bluntly to what appeared to be a well-encapsulated lipoma which easily delivered itself through the wound.  It measured approximately 1 to 2 cm and was sent to pathology.  The wound was closed with subcuticular 4-0 Monocryl.  Steri-Strips were placed.  The patient was then placed in the prone position.  Back was prepped and draped in normal sterile fashion.  Again in the identical fashion, skin and subcutaneous tissue over three masses were anesthetized.  A transverse incisions were made dissected down.  All three lipomas were excised from separate incisions.  The wounds were closed with subcuticular 4-0 Monocryl. Steri-Strips were placed.  The patient tolerated the procedure well and went to PACU in good condition. Dictated by:   Earna Coder, M.D. Attending Physician:  Danna Hefty R. DD:  10/09/00 TD:  10/09/00 Job: [id number]

## 2010-06-09 NOTE — Discharge Summary (Signed)
NAMETAMINA, CYPHERS          ACCOUNT NO.:  [id number]   MEDICAL RECORD NO.:  [id number]          PATIENT TYPE:  INP   LOCATION:  9125                          FACILITY:  [facility]   PHYSICIAN:  Timothy P. Fontaine, M.D.DATE OF BIRTH:  1975/10/24   DATE OF ADMISSION:  11/15/2003  DATE OF DISCHARGE:  11/18/2003                                 DISCHARGE SUMMARY   DISCHARGE DIAGNOSES:  1.  Pregnancy at term.  2.  History of spinal fusion.   PROCEDURE:  Primary low transverse cervical cesarean section November 15, 2003.   HOSPITAL COURSE:  A 35 year old, G1, P60 female term gestation admitted for  primary cesarean section due to history of spinal fusion with orthopedic  recommendation to avoid labor.  The patient underwent an uncomplicated  primary low transverse cervical cesarean section November 15, 2003 producing  a normal female with Apgar's of 5 and 8, weight 7 pounds 9 ounces per Ivor Costa. Farrel Gobble, M.D.  The patient's postoperative course was uncomplicated and  she was discharged on postoperative day number three ambulating well  tolerating a regular diet with a hemoglobin of 11.8. The patient's blood  type is O positive, her rubella titer is positive.  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 The patient was given precaution instructions in followup to be  seen in the office in six weeks and was given a prescription for Tylox #15,  1-2 p.o. q.4-6 h.     Timo   TPF/MEDQ  D:  11/18/2003  T:  11/18/2003  Job:  [id number]

## 2010-06-09 NOTE — Discharge Summary (Signed)
NAMEWENDELL, FIEBIG             ACCOUNT NO.:  [id number]   MEDICAL RECORD NO.:  [id number]          PATIENT TYPE:  INP   LOCATION:  9146                          FACILITY:  [facility]   PHYSICIAN:  Charles A. Delcambre, MDDATE OF BIRTH:  [birth year]   DATE OF ADMISSION:  01/09/2006  DATE OF DISCHARGE:  01/11/2006                               DISCHARGE SUMMARY   DISCHARGE DIAGNOSES:  1. Intrauterine pregnancy, 39 weeks.  2. Previous cesarean section.  3. Undesired fertility.  4. Positive lupus anticoagulant.   PROCEDURE:  Repeat low transverse Cesarean section, modified Pomeroy  bilateral tubal ligation.   OPERATIVE FINDINGS:  Vigorous female, Apgars 9 and 9, 3370 g, 47 cm in  length. Normal tubes and ovaries. Placenta to pathology and tubal  segments to pathology; pathology pending.   LABORATORY DATA:  Postoperative hematocrit 27.6, hemoglobin 9.7.   HISTORY AND PHYSICAL:  As dictated on the chart.   HOSPITAL COURSE:  The patient was admitted and underwent surgery as  noted above. Postoperatively, she had routine postoperative course. She  voided without difficulty on postoperative day #1. Had some flatus  return and was given general diet. She tolerated general diet, on  postoperative day #1, she had good pain control with p.o. medications  and adamantly requested discharge home on postoperative day #2. She was  rubella equivalent, and rubella vaccine was ordered prior to discharge.  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 During the early pregnancy, she did have a TPPA  which was negative and had so in the past. TPPA was repeated on  admission and was negative. She was discharged home to follow up in the  office in three days to discontinue staples. She was prescription for  Motrin 600 mg 1 p.o. q.6h. p.r.n. #30 one refill and Hemocyte 1 p.o.  daily #30 one refill. She has a prescription for Vicodin at home that  she will  use as needed that she had before surgery for back pain with  chronic back issues and lumbar fusion. She is instructed to call for any  temperature greater than 100 degrees, increased pain or bleeding,  incisional redness or drainage. No driving for two weeks, shower okay  for two weeks, no lifting greater than 25 pounds for four weeks. All  questions were answered, and she will follow up as directed.      Charles A. Sydnee Cabal, MD  Electronically Signed     CAD/MEDQ  D:  01/11/2006  T:  01/11/2006  Job:  [id number]

## 2012-02-02 ENCOUNTER — Emergency Department (HOSPITAL_BASED_OUTPATIENT_CLINIC_OR_DEPARTMENT_OTHER)
Admission: EM | Admit: 2012-02-02 | Discharge: 2012-02-02 | Disposition: A | Discharge status: Home / Self Care | Payer: Self-pay | Attending: Emergency Medicine | Admitting: Emergency Medicine

## 2012-02-02 ENCOUNTER — Encounter (HOSPITAL_BASED_OUTPATIENT_CLINIC_OR_DEPARTMENT_OTHER): Payer: Self-pay

## 2012-02-02 ENCOUNTER — Emergency Department (HOSPITAL_BASED_OUTPATIENT_CLINIC_OR_DEPARTMENT_OTHER): Payer: Self-pay

## 2012-02-02 DIAGNOSIS — Z9851 Tubal ligation status: Secondary | ICD-10-CM | POA: Insufficient documentation

## 2012-02-02 DIAGNOSIS — Z9889 Other specified postprocedural states: Secondary | ICD-10-CM | POA: Insufficient documentation

## 2012-02-02 DIAGNOSIS — R109 Unspecified abdominal pain: Secondary | ICD-10-CM | POA: Insufficient documentation

## 2012-02-02 DIAGNOSIS — F17200 Nicotine dependence, unspecified, uncomplicated: Secondary | ICD-10-CM | POA: Insufficient documentation

## 2012-02-02 DIAGNOSIS — N390 Urinary tract infection, site not specified: Secondary | ICD-10-CM | POA: Insufficient documentation

## 2012-02-02 DIAGNOSIS — Z79899 Other long term (current) drug therapy: Secondary | ICD-10-CM | POA: Insufficient documentation

## 2012-02-02 DIAGNOSIS — Z86718 Personal history of other venous thrombosis and embolism: Secondary | ICD-10-CM | POA: Insufficient documentation

## 2012-02-02 DIAGNOSIS — Z3202 Encounter for pregnancy test, result negative: Secondary | ICD-10-CM | POA: Insufficient documentation

## 2012-02-02 HISTORY — DX: Acute embolism and thrombosis of unspecified deep veins of unspecified lower extremity: I82.409

## 2012-02-02 LAB — URINE MICROSCOPIC-ADD ON

## 2012-02-02 LAB — URINALYSIS, ROUTINE W REFLEX MICROSCOPIC
Bilirubin Urine: NEGATIVE
Glucose, UA: NEGATIVE mg/dL
Ketones, ur: NEGATIVE mg/dL
Nitrite: POSITIVE — AB
Protein, ur: 30 mg/dL — AB
Specific Gravity, Urine: 1.018 (ref 1.005–1.030)
Urobilinogen, UA: 0.2 mg/dL (ref 0.0–1.0)
pH: 5 (ref 5.0–8.0)

## 2012-02-02 LAB — PREGNANCY, URINE: Preg Test, Ur: NEGATIVE

## 2012-02-02 MED ORDER — CIPROFLOXACIN HCL 500 MG PO TABS
500.0000 mg | ORAL_TABLET | Freq: Once | ORAL | Status: AC
  Administered 2012-02-02: 500 mg via ORAL
  Filled 2012-02-02: qty 1

## 2012-02-02 MED ORDER — OXYCODONE-ACETAMINOPHEN 5-325 MG PO TABS: 1.0000 | ORAL_TABLET | Freq: Four times a day (QID) | ORAL | Status: DC | PRN

## 2012-02-02 MED ORDER — CIPROFLOXACIN HCL 500 MG PO TABS: 500.0000 mg | ORAL_TABLET | Freq: Two times a day (BID) | ORAL | Status: DC

## 2012-02-02 MED ORDER — OXYCODONE-ACETAMINOPHEN 5-325 MG PO TABS
2.0000 | ORAL_TABLET | Freq: Once | ORAL | Status: AC
  Administered 2012-02-02: 2 via ORAL
  Filled 2012-02-02 (×2): qty 2

## 2012-02-02 NOTE — Discharge Instructions (Signed)
Urinary Tract Infection Urinary tract infections (UTIs) can develop anywhere along your urinary tract. Your urinary tract is your body's drainage system for removing wastes and extra water. Your urinary tract includes two kidneys, two ureters, a bladder, and a urethra. Your kidneys are a pair of bean-shaped organs. Each kidney is about the size of your fist. They are located below your ribs, one on each side of your spine. CAUSES Infections are caused by microbes, which are microscopic organisms, including fungi, viruses, and bacteria. These organisms are so small that they can only be seen through a microscope. Bacteria are the microbes that most commonly cause UTIs. SYMPTOMS  Symptoms of UTIs may vary by age and gender of the patient and by the location of the infection. Symptoms in young women typically include a frequent and intense urge to urinate and a painful, burning feeling in the bladder or urethra during urination. Older women and men are more likely to be tired, shaky, and weak and have muscle aches and abdominal pain. A fever may mean the infection is in your kidneys. Other symptoms of a kidney infection include pain in your back or sides below the ribs, nausea, and vomiting. DIAGNOSIS To diagnose a UTI, your caregiver will ask you about your symptoms. Your caregiver also will ask to provide a urine sample. The urine sample will be tested for bacteria and white blood cells. White blood cells are made by your body to help fight infection. TREATMENT  Typically, UTIs can be treated with medication. Because most UTIs are caused by a bacterial infection, they usually can be treated with the use of antibiotics. The choice of antibiotic and length of treatment depend on your symptoms and the type of bacteria causing your infection. HOME CARE INSTRUCTIONS  If you were prescribed antibiotics, take them exactly as your caregiver instructs you. Finish the medication even if you feel better after you  have only taken some of the medication.  Drink enough water and fluids to keep your urine clear or pale yellow.  Avoid caffeine, tea, and carbonated beverages. They tend to irritate your bladder.  Empty your bladder often. Avoid holding urine for long periods of time.  Empty your bladder before and after sexual intercourse.  After a bowel movement, women should cleanse from front to back. Use each tissue only once. SEEK MEDICAL CARE IF:   You have back pain.  You develop a fever.  Your symptoms do not begin to resolve within 3 days. SEEK IMMEDIATE MEDICAL CARE IF:   You have severe back pain or lower abdominal pain.  You develop chills.  You have nausea or vomiting.  You have continued burning or discomfort with urination. MAKE SURE YOU:   Understand these instructions.  Will watch your condition.  Will get help right away if you are not doing well or get worse. Document Released: 10/18/2004 Document Revised: 07/10/2011 Document Reviewed: 02/16/2011 ExitCare Patient Information 2013 [employer].  

## 2012-02-02 NOTE — ED Provider Notes (Signed)
History     CSN: [id number]  Arrival date & time 02/02/12  4098   First MD Initiated Contact with Patient 02/02/12 [phone]      Chief Complaint  Patient presents with  . Dysuria  . Flank Pain    right side    (Consider location/radiation/quality/duration/timing/severity/associated sxs/prior treatment) HPI Pt is otherwise healthy female who reports 3 days of dysuria, burning with urination, similar to previous UTI but not improved with home remedies including AZO and cranberry juice. She reports she began to have modecate to severe aching pain in R flank this morning, but no vomiting or fever. No vaginal bleeding or discharge Past Medical History  Diagnosis Date  . DVT (deep venous thrombosis)     Past Surgical History  Procedure Date  . Lumbar fusion   . Cesarean section     x2  . Tubal ligation     History reviewed. No pertinent family history.  History  Substance Use Topics  . Smoking status: Current Every Day Smoker -- 1.0 packs/day    Types: Cigarettes  . Smokeless tobacco: Never Used  . Alcohol Use: No    OB History    Grav Para Term Preterm Abortions TAB SAB Ect Mult Living                  Review of Systems All other systems reviewed and are negative except as noted in HPI.   Allergies  Review of patient's allergies indicates no known allergies.  Home Medications   Current Outpatient Rx  Name  Route  Sig  Dispense  Refill  . IBUPROFEN 800 MG PO TABS   Oral   Take 800 mg by mouth every 8 (eight) hours as needed.         . AZO TABS PO   Oral   Take 2 tablets by mouth 3 (three) times daily.           BP 174/95  Pulse 96  Temp 98.6 F (37 C) (Oral)  Resp 20  SpO2 100%  LMP 01/19/2012  Physical Exam  Nursing note and vitals reviewed. Constitutional: She is oriented to person, place, and time. She appears well-developed and well-nourished.  HENT:  Head: Normocephalic and atraumatic.  Eyes: EOM are normal. Pupils are equal, round, and  reactive to light.  Neck: Normal range of motion. Neck supple.  Cardiovascular: Normal rate, normal heart sounds and intact distal pulses.   Pulmonary/Chest: Effort normal and breath sounds normal.  Abdominal: Bowel sounds are normal. She exhibits no distension. There is no tenderness.  Genitourinary:       Mild R CVA tenderness  Musculoskeletal: Normal range of motion. She exhibits no edema and no tenderness.  Neurological: She is alert and oriented to person, place, and time. She has normal strength. No cranial nerve deficit or sensory deficit.  Skin: Skin is warm and dry. No rash noted.  Psychiatric: She has a normal mood and affect.    ED Course  Procedures (including critical care time)  Labs Reviewed  URINALYSIS, ROUTINE W REFLEX MICROSCOPIC - Abnormal; Notable for the following:    APPearance CLOUDY (*)     Hgb urine dipstick LARGE (*)     Protein, ur 30 (*)     Nitrite POSITIVE (*)     Leukocytes, UA LARGE (*)     All other components within normal limits  URINE MICROSCOPIC-ADD ON - Abnormal; Notable for the following:    Squamous Epithelial / LPF FEW (*)  Bacteria, UA MANY (*)     All other components within normal limits  PREGNANCY, URINE  URINE CULTURE   Ct Abdomen Pelvis Wo Contrast  02/02/2012  *RADIOLOGY REPORT*  Clinical Data: Right-sided flank pain.  Evaluate for kidney stone.  CT ABDOMEN AND PELVIS WITHOUT CONTRAST  Technique:  Multidetector CT imaging of the abdomen and pelvis was performed following the standard protocol without intravenous contrast.  Comparison: CT of the abdomen and pelvis 01/09/2010.  Findings:  Lung Bases: Unremarkable.  Abdomen/Pelvis:  There are no abnormal calcifications within the collecting system of either kidney, along the course of either ureter, or within the lumen of the urinary bladder.  No hydroureteronephrosis or perinephric stranding to suggest urinary tract obstruction at this time.  The unenhanced appearance of the visualized  liver, gallbladder, pancreas and bilateral adrenal glands is unremarkable.  Spleen is enlarged measuring 15.1 cm AP.  There are a few scattered colonic diverticula, predominately of the descending colon and sigmoid colon, with lesser involvement of the transverse colon, without surrounding inflammatory changes to suggest an acute diverticulitis at this time.  Normal appendix.  No ascites or pneumoperitoneum and no pathologic distension of small bowel.  No definite pathologic lymphadenopathy identified within the abdomen or pelvis.  The uterus and ovaries are unremarkable in appearance on this noncontrast CT examination.  Urinary bladder is nearly completely decompressed.  Musculoskeletal: There are no aggressive appearing lytic or blastic lesions noted in the visualized portions of the skeleton.  Status post PLIF that is L4-L5.  IMPRESSION: 1.  No urinary tract calculi or findings to suggest urinary tract obstruction. 2.  No acute findings in the abdomen or pelvis to account for patient's symptoms. 3.  Normal appendix. 4.  Colonic diverticulosis without findings to suggest acute diverticulitis, as above. 5.  Splenomegaly. 6.  Additional incidental findings, as above.   Original Report Authenticated By: Trudie Reed, M.D.      No diagnosis found.    MDM  Pt with TNTC WBC and RBC, will send for CT to rule out renal stone complicated by UTI. Pain medications ordered.   10:04 AM CT neg for stone, pt states she has had Cipro in the past with good results for UTI. Urine sent for culture. Advised to return for worsening.     Charles B. Bernette Mayers, MD 02/02/12 1004

## 2012-02-02 NOTE — ED Notes (Signed)
Pt states she has had dysuria, burning (constantly), for the past 3 days, and that she had onset of R flank pain this morning.  Pt states that she has been treating this at home with AZO and no relief was obtained.

## 2012-02-04 LAB — URINE CULTURE: Colony Count: >=100000

## 2012-02-05 ENCOUNTER — Telehealth (HOSPITAL_COMMUNITY): Payer: Self-pay | Admitting: Emergency Medicine

## 2012-02-05 NOTE — ED Notes (Signed)
Results received from [facility]. (+) URNC -> >/= 100,000 colonies, E Coli. Rx given in ED for Cipro  -> sensitive to the same.  Chart appended per protocol.  

## 2012-12-22 ENCOUNTER — Other Ambulatory Visit: Payer: Self-pay

## 2012-12-22 ENCOUNTER — Emergency Department (HOSPITAL_COMMUNITY): Payer: PRIVATE HEALTH INSURANCE

## 2012-12-22 ENCOUNTER — Encounter (HOSPITAL_COMMUNITY): Payer: Self-pay | Admitting: Emergency Medicine

## 2012-12-22 DIAGNOSIS — Z79899 Other long term (current) drug therapy: Secondary | ICD-10-CM | POA: Insufficient documentation

## 2012-12-22 DIAGNOSIS — H9209 Otalgia, unspecified ear: Secondary | ICD-10-CM | POA: Insufficient documentation

## 2012-12-22 DIAGNOSIS — J029 Acute pharyngitis, unspecified: Secondary | ICD-10-CM | POA: Insufficient documentation

## 2012-12-22 DIAGNOSIS — E0500 Thyrotoxicosis with diffuse goiter without thyrotoxic crisis or storm: Secondary | ICD-10-CM | POA: Insufficient documentation

## 2012-12-22 DIAGNOSIS — J209 Acute bronchitis, unspecified: Secondary | ICD-10-CM | POA: Insufficient documentation

## 2012-12-22 DIAGNOSIS — Z86718 Personal history of other venous thrombosis and embolism: Secondary | ICD-10-CM | POA: Insufficient documentation

## 2012-12-22 DIAGNOSIS — R059 Cough, unspecified: Secondary | ICD-10-CM | POA: Insufficient documentation

## 2012-12-22 DIAGNOSIS — Z87891 Personal history of nicotine dependence: Secondary | ICD-10-CM | POA: Insufficient documentation

## 2012-12-22 DIAGNOSIS — E282 Polycystic ovarian syndrome: Secondary | ICD-10-CM | POA: Insufficient documentation

## 2012-12-22 DIAGNOSIS — R609 Edema, unspecified: Secondary | ICD-10-CM | POA: Insufficient documentation

## 2012-12-22 DIAGNOSIS — I509 Heart failure, unspecified: Secondary | ICD-10-CM | POA: Insufficient documentation

## 2012-12-22 DIAGNOSIS — R05 Cough: Secondary | ICD-10-CM | POA: Insufficient documentation

## 2012-12-22 LAB — COMPREHENSIVE METABOLIC PANEL
ALT: 16 U/L (ref 0–35)
AST: 14 U/L (ref 0–37)
Albumin: 3.4 g/dL — ABNORMAL LOW (ref 3.5–5.2)
Alkaline Phosphatase: 92 U/L (ref 39–117)
CO2: 25 mEq/L (ref 19–32)
Calcium: 9.1 mg/dL (ref 8.4–10.5)
Creatinine, Ser: 0.8 mg/dL (ref 0.50–1.10)
GFR calc non Af Amer: >90 mL/min (ref >90)
Sodium: 139 mEq/L (ref 135–145)
Total Bilirubin: 0.1 mg/dL — ABNORMAL LOW (ref 0.3–1.2)
Total Protein: 6.6 g/dL (ref 6.0–8.3)

## 2012-12-22 LAB — CBC WITH DIFFERENTIAL/PLATELET
Basophils Absolute: 0 10*3/uL (ref 0.0–0.1)
Eosinophils Relative: 3 % (ref 0–5)
HCT: 34.4 % — ABNORMAL LOW (ref 36.0–46.0)
Hemoglobin: 11.7 g/dL — ABNORMAL LOW (ref 12.0–15.0)
Lymphocytes Relative: 36 % (ref 12–46)
MCH: 28.8 pg (ref 26.0–34.0)
MCV: 84.7 fL (ref 78.0–100.0)
Monocytes Absolute: 0.6 10*3/uL (ref 0.1–1.0)
Monocytes Relative: 8 % (ref 3–12)
Neutro Abs: 3.7 10*3/uL (ref 1.7–7.7)
Platelets: 260 10*3/uL (ref 150–400)
RBC: 4.06 MIL/uL (ref 3.87–5.11)
RDW: 13.7 % (ref 11.5–15.5)
WBC: 7 10*3/uL (ref 4.0–10.5)

## 2012-12-22 MED ORDER — ALBUTEROL SULFATE (5 MG/ML) 0.5% IN NEBU
5.0000 mg | INHALATION_SOLUTION | Freq: Once | RESPIRATORY_TRACT | Status: AC
  Administered 2012-12-22: 5 mg via RESPIRATORY_TRACT
  Filled 2012-12-22: qty 1

## 2012-12-22 NOTE — ED Notes (Signed)
Pt. reports SOB with dry cough for several days , denies fever or chills. No chest pain .

## 2012-12-23 ENCOUNTER — Emergency Department (HOSPITAL_COMMUNITY)
Admission: EM | Admit: 2012-12-23 | Discharge: 2012-12-23 | Disposition: A | Discharge status: Home / Self Care | Payer: PRIVATE HEALTH INSURANCE | Attending: Emergency Medicine | Admitting: Emergency Medicine

## 2012-12-23 ENCOUNTER — Encounter (HOSPITAL_COMMUNITY): Payer: Self-pay | Admitting: Emergency Medicine

## 2012-12-23 DIAGNOSIS — J209 Acute bronchitis, unspecified: Secondary | ICD-10-CM

## 2012-12-23 HISTORY — DX: Heart failure, unspecified: I50.9

## 2012-12-23 HISTORY — DX: Thyrotoxicosis with diffuse goiter without thyrotoxic crisis or storm: E05.00

## 2012-12-23 HISTORY — DX: Polycystic ovarian syndrome: E28.2

## 2012-12-23 MED ORDER — PREDNISONE 50 MG PO TABS: 50.0000 mg | ORAL_TABLET | Freq: Every day | ORAL | Status: DC

## 2012-12-23 MED ORDER — METHYLPREDNISOLONE SODIUM SUCC 125 MG IJ SOLR
125.0000 mg | Freq: Once | INTRAMUSCULAR | Status: AC
  Administered 2012-12-23: 125 mg via INTRAVENOUS
  Filled 2012-12-23: qty 2

## 2012-12-23 MED ORDER — ALBUTEROL (5 MG/ML) CONTINUOUS INHALATION SOLN
10.0000 mg/h | INHALATION_SOLUTION | Freq: Once | RESPIRATORY_TRACT | Status: AC
  Administered 2012-12-23: 10 mg/h via RESPIRATORY_TRACT
  Filled 2012-12-23: qty 20

## 2012-12-23 NOTE — Discharge Instructions (Signed)
Bronchitis °Bronchitis is the body's way of reacting to injury and/or infection (inflammation) of the bronchi. Bronchi are the air tubes that extend from the windpipe into the lungs. If the inflammation becomes severe, it may cause shortness of breath. °CAUSES  °Inflammation may be caused by: °· A virus. °· Germs (bacteria). °· Dust. °· Allergens. °· Pollutants and many other irritants. °The cells lining the bronchial tree are covered with tiny hairs (cilia). These constantly beat upward, away from the lungs, toward the mouth. This keeps the lungs free of pollutants. When these cells become too irritated and are unable to do their job, mucus begins to develop. This causes the characteristic cough of bronchitis. The cough clears the lungs when the cilia are unable to do their job. Without either of these protective mechanisms, the mucus would settle in the lungs. Then you would develop pneumonia. °Smoking is a common cause of bronchitis and can contribute to pneumonia. Stopping this habit is the single most important thing you can do to help yourself. °TREATMENT  °· Your caregiver may prescribe an antibiotic if the cough is caused by bacteria. Also, medicines that open up your airways make it easier to breathe. Your caregiver may also recommend or prescribe an expectorant. It will loosen the mucus to be coughed up. Only take over-the-counter or prescription medicines for pain, discomfort, or fever as directed by your caregiver. °· Removing whatever causes the problem (smoking, for example) is critical to preventing the problem from getting worse. °· Cough suppressants may be prescribed for relief of cough symptoms. °· Inhaled medicines may be prescribed to help with symptoms now and to help prevent problems from returning. °· For those with recurrent (chronic) bronchitis, there may be a need for steroid medicines. °SEEK IMMEDIATE MEDICAL CARE IF:  °· During treatment, you develop more pus-like mucus (purulent  sputum). °· You have a fever. °· You become progressively more ill. °· You have increased difficulty breathing, wheezing, or shortness of breath. °It is necessary to seek immediate medical care if you are elderly or sick from any other disease. °MAKE SURE YOU:  °· Understand these instructions. °· Will watch your condition. °· Will get help right away if you are not doing well or get worse. °Document Released: 01/08/2005 Document Revised: 09/10/2012 Document Reviewed: 09/02/2012 °Exit[employer]® Patient Information ©2014 [employer]. ° °

## 2012-12-23 NOTE — ED Provider Notes (Signed)
CSN: [id number]     Arrival date & time 12/22/12  2115 History   First MD Initiated Contact with Patient 12/23/12 0105     Chief Complaint  Patient presents with  . Shortness of Breath   (Consider location/radiation/quality/duration/timing/severity/associated sxs/prior Treatment) HPI Right ear discomfort, sore throat and nonproductive cough. She's had gradually worsening shortness of breath especially with exertion. She's had mild lower sugary swelling without calf pain or tenderness. Denies fevers or chills. She denies chest pain. No recent immobilization. Past Medical History  Diagnosis Date  . DVT (deep venous thrombosis)   . Graves disease   . PCOS (polycystic ovarian syndrome)   . CHF (congestive heart failure)    Past Surgical History  Procedure Laterality Date  . Lumbar fusion    . Cesarean section      x2  . Tubal ligation     History reviewed. No pertinent family history. History  Substance Use Topics  . Smoking status: Former Smoker -- 1.00 packs/day    Types: Cigarettes  . Smokeless tobacco: Never Used  . Alcohol Use: No   OB History   Grav Para Term Preterm Abortions TAB SAB Ect Mult Living                 Review of Systems  Constitutional: Negative for fever and chills.  HENT: Positive for ear pain and sore throat. Negative for sinus pressure.   Respiratory: Positive for cough and shortness of breath. Negative for chest tightness and wheezing.   Cardiovascular: Positive for leg swelling. Negative for chest pain and palpitations.  Gastrointestinal: Negative for nausea, vomiting, abdominal pain, diarrhea and constipation.  Genitourinary: Negative for dysuria and flank pain.  Musculoskeletal: Negative for back pain, myalgias, neck pain and neck stiffness.  Skin: Negative for rash and wound.  Neurological: Negative for dizziness, weakness, light-headedness, numbness and headaches.  All other systems reviewed and are negative.    Allergies  Review of  patient's allergies indicates no known allergies.  Home Medications   Current Outpatient Rx  Name  Route  Sig  Dispense  Refill  . albuterol (PROVENTIL HFA;VENTOLIN HFA) 108 (90 BASE) MCG/ACT inhaler   Inhalation   Inhale into the lungs every 6 (six) hours as needed for wheezing or shortness of breath.         . Esomeprazole Magnesium (NEXIUM PO)   Oral   Take 44.6 mg by mouth daily.         Marland Kitchen ibuprofen (ADVIL,MOTRIN) 200 MG tablet   Oral   Take 800 mg by mouth 2 (two) times daily as needed (for pain).         . metFORMIN (GLUCOPHAGE) 1000 MG tablet   Oral   Take 1,000 mg by mouth 2 (two) times daily with a meal.         . Multiple Vitamin (MULTIVITAMIN WITH MINERALS) TABS tablet   Oral   Take 1 tablet by mouth daily.         Marland Kitchen olmesartan-hydrochlorothiazide (BENICAR HCT) 40-12.5 MG per tablet   Oral   Take 1 tablet by mouth daily.          BP 139/76  Pulse 86  Temp(Src) 97.2 F (36.2 C) (Oral)  Resp 17  SpO2 97%  LMP 12/07/2012 Physical Exam  Nursing note and vitals reviewed. Constitutional: She is oriented to person, place, and time. She appears well-developed and well-nourished. No distress.  HENT:  Head: Normocephalic and atraumatic.  Mouth/Throat: Oropharynx is clear and moist.  No oropharyngeal exudate.  Right TM is bulging compared to left. Mildly erythematous posterior oropharynx without tonsillar exudate  Eyes: EOM are normal. Pupils are equal, round, and reactive to light.  Neck: Normal range of motion. Neck supple.  No meningismus  Cardiovascular: Normal rate and regular rhythm.   Pulmonary/Chest: Effort normal. No respiratory distress. She has no wheezes. She has no rales.  Mildly prolonged expiratory phase. Diminished breath sounds at bases.  Abdominal: Soft. Bowel sounds are normal. She exhibits no distension and no mass. There is no tenderness. There is no rebound and no guarding.  Musculoskeletal: Normal range of motion. She exhibits no  edema and no tenderness.  No calf swelling or tenderness. No appreciated pitting edema  Lymphadenopathy:    She has no cervical adenopathy.  Neurological: She is alert and oriented to person, place, and time.  Skin: Skin is warm and dry. No rash noted. No erythema.  Psychiatric: She has a normal mood and affect. Her behavior is normal.    ED Course  Procedures (including critical care time) Labs Review Labs Reviewed  CBC WITH DIFFERENTIAL - Abnormal; Notable for the following:    Hemoglobin 11.7 (*)    HCT 34.4 (*)    All other components within normal limits  COMPREHENSIVE METABOLIC PANEL - Abnormal; Notable for the following:    Glucose, Bld 105 (*)    Albumin 3.4 (*)    Total Bilirubin 0.1 (*)    All other components within normal limits   Imaging Review Dg Chest 2 View  12/22/2012   CLINICAL DATA:  Shortness of breath  EXAM: CHEST  2 VIEW  COMPARISON:  April 29, 2010  FINDINGS: The heart size and vascular pattern are normal. There is mild diffuse interstitial change. No consolidation or effusion. When compared to the prior study, the interstitial process is not significantly altered.  IMPRESSION: Chronic interstitial lung disease.  No acute findings.   Electronically Signed   By: Esperanza Heir M.D.   On: 12/22/2012 22:52    EKG Interpretation   None       MDM  Patient states she is feeling much better after the nebulized treatments. Her d-dimer is negative and her BNP is only very mildly elevated. I think her symptoms are likely related to bronchitis with bronchospasm. Return precautions have been given and patient voices understanding.   Loren Racer, MD 12/23/12 [phone]

## 2012-12-23 NOTE — ED Notes (Signed)
Gave pt. Husband Coke

## 2013-04-11 ENCOUNTER — Encounter (HOSPITAL_BASED_OUTPATIENT_CLINIC_OR_DEPARTMENT_OTHER): Payer: Self-pay | Admitting: Emergency Medicine

## 2013-04-11 ENCOUNTER — Emergency Department (HOSPITAL_BASED_OUTPATIENT_CLINIC_OR_DEPARTMENT_OTHER)
Admission: EM | Admit: 2013-04-11 | Discharge: 2013-04-12 | Disposition: A | Discharge status: Home / Self Care | Payer: PRIVATE HEALTH INSURANCE | Attending: Emergency Medicine | Admitting: Emergency Medicine

## 2013-04-11 DIAGNOSIS — Z87891 Personal history of nicotine dependence: Secondary | ICD-10-CM | POA: Insufficient documentation

## 2013-04-11 DIAGNOSIS — M791 Myalgia, unspecified site: Secondary | ICD-10-CM

## 2013-04-11 DIAGNOSIS — IMO0001 Reserved for inherently not codable concepts without codable children: Secondary | ICD-10-CM | POA: Insufficient documentation

## 2013-04-11 DIAGNOSIS — Z86718 Personal history of other venous thrombosis and embolism: Secondary | ICD-10-CM | POA: Insufficient documentation

## 2013-04-11 DIAGNOSIS — Z79899 Other long term (current) drug therapy: Secondary | ICD-10-CM | POA: Insufficient documentation

## 2013-04-11 DIAGNOSIS — I509 Heart failure, unspecified: Secondary | ICD-10-CM | POA: Insufficient documentation

## 2013-04-11 DIAGNOSIS — Z8639 Personal history of other endocrine, nutritional and metabolic disease: Secondary | ICD-10-CM | POA: Insufficient documentation

## 2013-04-11 DIAGNOSIS — Z862 Personal history of diseases of the blood and blood-forming organs and certain disorders involving the immune mechanism: Secondary | ICD-10-CM | POA: Insufficient documentation

## 2013-04-11 DIAGNOSIS — IMO0002 Reserved for concepts with insufficient information to code with codable children: Secondary | ICD-10-CM | POA: Insufficient documentation

## 2013-04-11 DIAGNOSIS — E282 Polycystic ovarian syndrome: Secondary | ICD-10-CM | POA: Insufficient documentation

## 2013-04-11 LAB — COMPREHENSIVE METABOLIC PANEL
ALK PHOS: 88 U/L (ref 39–117)
ALT: 28 U/L (ref 0–35)
AST: 35 U/L (ref 0–37)
Albumin: 4 g/dL (ref 3.5–5.2)
BUN: 21 mg/dL (ref 6–23)
CO2: 25 mEq/L (ref 19–32)
Calcium: 9.2 mg/dL (ref 8.4–10.5)
Chloride: 101 mEq/L (ref 96–112)
Creatinine, Ser: 1.1 mg/dL (ref 0.50–1.10)
GFR, EST AFRICAN AMERICAN: 72 mL/min — AB (ref >90)
GFR, EST NON AFRICAN AMERICAN: 62 mL/min — AB (ref >90)
Glucose, Bld: 98 mg/dL (ref 70–99)
Potassium: 3.8 mEq/L (ref 3.7–5.3)
SODIUM: 142 meq/L (ref 137–147)
Total Protein: 7.3 g/dL (ref 6.0–8.3)

## 2013-04-11 LAB — CBC WITH DIFFERENTIAL/PLATELET
BASOS PCT: 0 % (ref 0–1)
Basophils Absolute: 0 10*3/uL (ref 0.0–0.1)
Eosinophils Absolute: 0.1 10*3/uL (ref 0.0–0.7)
Eosinophils Relative: 1 % (ref 0–5)
HCT: 38.6 % (ref 36.0–46.0)
Hemoglobin: 12.9 g/dL (ref 12.0–15.0)
Lymphocytes Relative: 39 % (ref 12–46)
Lymphs Abs: 3.8 10*3/uL (ref 0.7–4.0)
MCH: 30.2 pg (ref 26.0–34.0)
MCHC: 33.4 g/dL (ref 30.0–36.0)
MCV: 90.4 fL (ref 78.0–100.0)
Monocytes Absolute: 0.7 10*3/uL (ref 0.1–1.0)
Monocytes Relative: 7 % (ref 3–12)
NEUTROS PCT: 53 % (ref 43–77)
Neutro Abs: 5.1 10*3/uL (ref 1.7–7.7)
Platelets: 365 10*3/uL (ref 150–400)
RBC: 4.27 MIL/uL (ref 3.87–5.11)
RDW: 14.6 % (ref 11.5–15.5)
WBC: 9.7 10*3/uL (ref 4.0–10.5)

## 2013-04-11 NOTE — Discharge Instructions (Signed)
Please recheck with your doctor early this week to have thyroid rechecked.

## 2013-04-11 NOTE — ED Notes (Signed)
Pt reports numbness bilat arms Hx thyroid radiation pt sent to ED by Dr Truddie Crumble endocrinologist Marcy Panning to be examined

## 2013-04-11 NOTE — ED Provider Notes (Signed)
CSN: [id number]     Arrival date & time 04/11/13  2107 History  This chart was scribed for Hilario Quarry, MD by Blanchard Kelch, ED Scribe. The patient was seen in room MH04/MH04. Patient's care was started at 10:45 PM.     Chief Complaint  Patient presents with  . Numbness     Patient is a 38 y.o. female presenting with general illness. The history is provided by the patient. No language interpreter was used.  Illness Location:  Arms bilaterally, feet Quality:  Numbness Onset quality:  Gradual Duration:  4 days Timing:  Constant Progression:  Unchanged Chronicity:  New Context:  Started on Synthroid two weeks ago   HPI Comments: AALEYAH WITHEROW is a 38 y.o. female with a history of Graves disease who presents to the Emergency Department complaining of constant bilateral numbness in her bilateral arms and feet that began four days ago. He reports associated cramping in her extremities, hands and abdomen that began after the numbness started as well as hand weakness. She states that it feels as if she is losing her grip strength and has been dropping objects. 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 She denies relief with it. She was started on Synthroid two weeks ago by her Endocrinologist, Dr. Althea Grimmer, due to [facility] level of 28. She states that she was taking a tablet every other day but just switched to a tablet everyday two days ago. She states that she is taking Benicar and HCTZ and has been taking them compliantly. She denies any recent drastic weight change. She is right hand dominant.    Past Medical History  Diagnosis Date  . DVT (deep venous thrombosis)   . Graves disease   . PCOS (polycystic ovarian syndrome)   . CHF (congestive heart failure)    Past Surgical History  Procedure Laterality Date  . Lumbar fusion    . Cesarean section      x2  . Tubal ligation      History reviewed. No pertinent family history. History  Substance Use Topics  . Smoking status: Former Smoker -- 1.00 packs/day    Types: Cigarettes  . Smokeless tobacco: Never Used  . Alcohol Use: No   OB History   Grav Para Term Preterm Abortions TAB SAB Ect Mult Living                 Review of Systems  Musculoskeletal:       Positive for cramping, generalized  Neurological: Positive for weakness and numbness.  All other systems reviewed and are negative.      Allergies  Review of patient's allergies indicates no known allergies.  Home Medications   Current Outpatient Rx  Name  Route  Sig  Dispense  Refill  . levothyroxine (SYNTHROID, LEVOTHROID) 75 MCG tablet   Oral   Take 88 mcg by mouth daily before breakfast.         . albuterol (PROVENTIL HFA;VENTOLIN HFA) 108 (90 BASE) MCG/ACT inhaler   Inhalation   Inhale into the lungs every 6 (six) hours as needed for wheezing or shortness of breath.         . Esomeprazole Magnesium (NEXIUM PO)   Oral   Take 44.6 mg by mouth daily.         Marland Kitchen ibuprofen (ADVIL,MOTRIN) 200 MG tablet   Oral   Take  800 mg by mouth 2 (two) times daily as needed (for pain).         . metFORMIN (GLUCOPHAGE) 1000 MG tablet   Oral   Take 1,000 mg by mouth 2 (two) times daily with a meal.         . Multiple Vitamin (MULTIVITAMIN WITH MINERALS) TABS tablet   Oral   Take 1 tablet by mouth daily.         Marland Kitchen olmesartan-hydrochlorothiazide (BENICAR HCT) 40-12.5 MG per tablet   Oral   Take 1 tablet by mouth daily.         . predniSONE (DELTASONE) 50 MG tablet   Oral   Take 1 tablet (50 mg total) by mouth daily.   5 tablet   0    Triage Vitals: BP 135/92  Pulse 66  Temp(Src) 97.9 F (36.6 C) (Oral)  Resp 18  SpO2 99%  Physical Exam  Nursing note and vitals reviewed. Constitutional: She is oriented to person, place, and time. She appears well-developed and well-nourished.  HENT:  Head: Normocephalic and atraumatic.   Eyes: Conjunctivae and EOM are normal. Pupils are equal, round, and reactive to light.  Neck: Normal range of motion. Neck supple.  Cardiovascular: Normal rate, regular rhythm and normal heart sounds.   Pulmonary/Chest: Effort normal and breath sounds normal. No respiratory distress. She has no wheezes. She has no rales. She exhibits no tenderness.  Abdominal: Soft. Bowel sounds are normal. There is no tenderness. There is no rebound and no guarding.  Musculoskeletal: Normal range of motion. She exhibits no edema.  Lymphadenopathy:    She has no cervical adenopathy.  Neurological: She is alert and oriented to person, place, and time.  Skin: Skin is warm and dry. No rash noted.  Psychiatric: She has a normal mood and affect.    ED Course  Procedures (including critical care time)  DIAGNOSTIC STUDIES: Oxygen Saturation is 99% on room air, normal by my interpretation.    COORDINATION OF CARE: 10:45 PM -Will order CMP and CBC. Patient verbalizes understanding and agrees with treatment plan.    Labs Review Labs Reviewed  COMPREHENSIVE METABOLIC PANEL - Abnormal; Notable for the following:    Total Bilirubin <0.2 (*)    GFR calc non Af Amer 62 (*)    GFR calc Af Amer 72 (*)    All other components within normal limits  CBC WITH DIFFERENTIAL   Imaging Review No results found.   EKG Interpretation None      MDM   Final diagnoses:  Myalgia    I personally performed the services described in this documentation, which was scribed in my presence. The recorded information has been reviewed and considered.   Hilario Quarry, MD 04/12/13 [phone]

## 2013-04-11 NOTE — ED Notes (Signed)
Attempted to draw patient's blood from her right arm and Left AC without success.  Patient verbalizes being a difficulty lab draw.  Established IV on first attempt to right hand, labs obtained.

## 2013-07-31 ENCOUNTER — Emergency Department (HOSPITAL_COMMUNITY): Payer: PRIVATE HEALTH INSURANCE

## 2013-07-31 ENCOUNTER — Encounter (HOSPITAL_COMMUNITY): Payer: Self-pay | Admitting: Emergency Medicine

## 2013-07-31 ENCOUNTER — Emergency Department (HOSPITAL_COMMUNITY)
Admission: EM | Admit: 2013-07-31 | Discharge: 2013-07-31 | Disposition: A | Discharge status: Home / Self Care | Payer: Self-pay | Attending: Emergency Medicine | Admitting: Emergency Medicine

## 2013-07-31 ENCOUNTER — Emergency Department (HOSPITAL_COMMUNITY): Payer: Self-pay

## 2013-07-31 DIAGNOSIS — Z9889 Other specified postprocedural states: Secondary | ICD-10-CM | POA: Insufficient documentation

## 2013-07-31 DIAGNOSIS — Z86718 Personal history of other venous thrombosis and embolism: Secondary | ICD-10-CM | POA: Insufficient documentation

## 2013-07-31 DIAGNOSIS — Z3202 Encounter for pregnancy test, result negative: Secondary | ICD-10-CM | POA: Insufficient documentation

## 2013-07-31 DIAGNOSIS — E0500 Thyrotoxicosis with diffuse goiter without thyrotoxic crisis or storm: Secondary | ICD-10-CM | POA: Insufficient documentation

## 2013-07-31 DIAGNOSIS — K219 Gastro-esophageal reflux disease without esophagitis: Secondary | ICD-10-CM | POA: Insufficient documentation

## 2013-07-31 DIAGNOSIS — Z79899 Other long term (current) drug therapy: Secondary | ICD-10-CM | POA: Insufficient documentation

## 2013-07-31 DIAGNOSIS — Z9851 Tubal ligation status: Secondary | ICD-10-CM | POA: Insufficient documentation

## 2013-07-31 DIAGNOSIS — R112 Nausea with vomiting, unspecified: Secondary | ICD-10-CM | POA: Insufficient documentation

## 2013-07-31 DIAGNOSIS — R1011 Right upper quadrant pain: Secondary | ICD-10-CM | POA: Insufficient documentation

## 2013-07-31 DIAGNOSIS — R1013 Epigastric pain: Secondary | ICD-10-CM | POA: Insufficient documentation

## 2013-07-31 DIAGNOSIS — I509 Heart failure, unspecified: Secondary | ICD-10-CM | POA: Insufficient documentation

## 2013-07-31 DIAGNOSIS — Z87891 Personal history of nicotine dependence: Secondary | ICD-10-CM | POA: Insufficient documentation

## 2013-07-31 LAB — URINALYSIS, ROUTINE W REFLEX MICROSCOPIC
Bilirubin Urine: NEGATIVE
Glucose, UA: NEGATIVE mg/dL
Ketones, ur: NEGATIVE mg/dL
Leukocytes, UA: NEGATIVE
Nitrite: NEGATIVE
Protein, ur: NEGATIVE mg/dL
SPECIFIC GRAVITY, URINE: 1.015 (ref 1.005–1.030)
UROBILINOGEN UA: 0.2 mg/dL (ref 0.0–1.0)
pH: 5 (ref 5.0–8.0)

## 2013-07-31 LAB — CBC WITH DIFFERENTIAL/PLATELET
Basophils Absolute: 0 10*3/uL (ref 0.0–0.1)
Basophils Relative: 0 % (ref 0–1)
EOS ABS: 0.2 10*3/uL (ref 0.0–0.7)
Eosinophils Relative: 3 % (ref 0–5)
HCT: 37.9 % (ref 36.0–46.0)
Hemoglobin: 12.8 g/dL (ref 12.0–15.0)
Lymphocytes Relative: 36 % (ref 12–46)
Lymphs Abs: 2.5 10*3/uL (ref 0.7–4.0)
MCH: 30.2 pg (ref 26.0–34.0)
MCHC: 33.8 g/dL (ref 30.0–36.0)
MCV: 89.4 fL (ref 78.0–100.0)
Monocytes Absolute: 0.5 10*3/uL (ref 0.1–1.0)
Monocytes Relative: 7 % (ref 3–12)
Neutro Abs: 3.8 10*3/uL (ref 1.7–7.7)
Neutrophils Relative %: 54 % (ref 43–77)
Platelets: 352 10*3/uL (ref 150–400)
RBC: 4.24 MIL/uL (ref 3.87–5.11)
RDW: 13.7 % (ref 11.5–15.5)
WBC: 7 10*3/uL (ref 4.0–10.5)

## 2013-07-31 LAB — COMPREHENSIVE METABOLIC PANEL
ALBUMIN: 4.2 g/dL (ref 3.5–5.2)
ALT: 24 U/L (ref 0–35)
AST: 19 U/L (ref 0–37)
Alkaline Phosphatase: 82 U/L (ref 39–117)
Anion gap: 19 — ABNORMAL HIGH (ref 5–15)
BUN: 11 mg/dL (ref 6–23)
CO2: 23 mEq/L (ref 19–32)
Calcium: 9.3 mg/dL (ref 8.4–10.5)
Chloride: 99 mEq/L (ref 96–112)
Creatinine, Ser: 0.94 mg/dL (ref 0.50–1.10)
GFR calc Af Amer: 87 mL/min — ABNORMAL LOW (ref >90)
GFR calc non Af Amer: 75 mL/min — ABNORMAL LOW (ref >90)
Glucose, Bld: 83 mg/dL (ref 70–99)
Potassium: 3.9 mEq/L (ref 3.7–5.3)
Sodium: 141 mEq/L (ref 137–147)
Total Bilirubin: <0.2 mg/dL — ABNORMAL LOW (ref 0.3–1.2)
Total Protein: 7.6 g/dL (ref 6.0–8.3)

## 2013-07-31 LAB — TROPONIN I: Troponin I: <0.3 ng/mL (ref <0.30)

## 2013-07-31 LAB — BASIC METABOLIC PANEL
Anion gap: 17 — ABNORMAL HIGH (ref 5–15)
BUN: 11 mg/dL (ref 6–23)
CO2: 25 meq/L (ref 19–32)
Calcium: 9.3 mg/dL (ref 8.4–10.5)
Chloride: 96 mEq/L (ref 96–112)
Creatinine, Ser: 0.95 mg/dL (ref 0.50–1.10)
GFR calc Af Amer: 86 mL/min — ABNORMAL LOW (ref >90)
GFR calc non Af Amer: 74 mL/min — ABNORMAL LOW (ref >90)
Glucose, Bld: 80 mg/dL (ref 70–99)
Potassium: 3.6 mEq/L — ABNORMAL LOW (ref 3.7–5.3)
Sodium: 138 mEq/L (ref 137–147)

## 2013-07-31 LAB — LIPASE, BLOOD: Lipase: 34 U/L (ref 11–59)

## 2013-07-31 LAB — URINE MICROSCOPIC-ADD ON

## 2013-07-31 LAB — PREGNANCY, URINE: Preg Test, Ur: NEGATIVE

## 2013-07-31 MED ORDER — SODIUM CHLORIDE 0.9 % IV BOLUS (SEPSIS)
1000.0000 mL | Freq: Once | INTRAVENOUS | Status: AC
  Administered 2013-07-31: 1000 mL via INTRAVENOUS

## 2013-07-31 MED ORDER — ONDANSETRON HCL 4 MG PO TABS: 4.0000 mg | ORAL_TABLET | Freq: Four times a day (QID) | ORAL | Status: DC | PRN

## 2013-07-31 MED ORDER — SUCRALFATE 1 GM/10ML PO SUSP: 1.0000 g | Freq: Three times a day (TID) | ORAL | Status: DC

## 2013-07-31 MED ORDER — FAMOTIDINE IN NACL 20-0.9 MG/50ML-% IV SOLN
20.0000 mg | Freq: Once | INTRAVENOUS | Status: AC
  Administered 2013-07-31: 20 mg via INTRAVENOUS
  Filled 2013-07-31: qty 50

## 2013-07-31 MED ORDER — SUCRALFATE 1 G PO TABS
1.0000 g | ORAL_TABLET | Freq: Once | ORAL | Status: AC
  Administered 2013-07-31: 1 g via ORAL
  Filled 2013-07-31: qty 1

## 2013-07-31 MED ORDER — ONDANSETRON HCL 4 MG/2ML IJ SOLN
4.0000 mg | Freq: Once | INTRAMUSCULAR | Status: AC
  Administered 2013-07-31: 4 mg via INTRAVENOUS
  Filled 2013-07-31: qty 2

## 2013-07-31 NOTE — ED Notes (Signed)
Pt returned from US

## 2013-07-31 NOTE — ED Provider Notes (Signed)
CSN: [id number]     Arrival date & time 07/31/13  1311 History   First MD Initiated Contact with Patient 07/31/13 1649     Chief Complaint  Patient presents with  . Abdominal Pain     (Consider location/radiation/quality/duration/timing/severity/associated sxs/prior Treatment) HPI  Melissa Massey is a 38 y.o. female  with history of GERD presenting with 1 month history of intermittent abdominal pain. Pain is in RUQ and epigastrium. Pain is post-prandial and now occurs after every meal for past 8 days. Patient reports episodes of pain radiating into the axilla and middle of back between scapulas. Pain is described as severe and sharp. No alleviating factors other than refraining from meals. Reports constant nausea for the past week that patient has self-treated with Zofran and Phenergan with good relief. Patient reports vomiting twice daily for past 8 days. She has been taking pepto-bismol daily with minimal relief. Pt reports "tan-colored, greasy" stools that alternate between solid and loose for past month. Subjective fever reported last night that resolved with motrin. No fever currently. Patient reports that her GERD is more severe recently with "burning in the chest" and excessive saliva production. She is taking Nexium for GERD. No headaches, chills, constipation or urinary issues.  Past Medical History  Diagnosis Date  . DVT (deep venous thrombosis)   . Graves disease   . PCOS (polycystic ovarian syndrome)   . CHF (congestive heart failure)    Past Surgical History  Procedure Laterality Date  . Lumbar fusion    . Cesarean section      x2  . Tubal ligation     History reviewed. No pertinent family history. History  Substance Use Topics  . Smoking status: Former Smoker -- 1.00 packs/day    Types: Cigarettes  . Smokeless tobacco: Never Used  . Alcohol Use: No   OB History   Grav Para Term Preterm Abortions TAB SAB Ect Mult Living                 Review of Systems  10  systems reviewed and found to be negative, except as noted in the HPI.   Allergies  Robitussin (alcohol free)  Home Medications   Prior to Admission medications   Medication Sig Start Date End Date Taking? Authorizing Provider  Esomeprazole Magnesium (NEXIUM PO) Take 44.6 mg by mouth daily.   Yes Historical Provider, MD  ibuprofen (ADVIL,MOTRIN) 200 MG tablet Take 800 mg by mouth 2 (two) times daily as needed (for pain).   Yes Historical Provider, MD  levothyroxine (SYNTHROID, LEVOTHROID) 88 MCG tablet Take 176-264 mcg by mouth daily before breakfast. Pat Kocher, Sat, Sun take 176 Mon, Wed, Fri, 264mg    Yes Historical Provider, MD  metFORMIN (GLUCOPHAGE) 1000 MG tablet Take 1,000 mg by mouth 2 (two) times daily with a meal.   Yes Historical Provider, MD  olmesartan-hydrochlorothiazide (BENICAR HCT) 40-25 MG per tablet Take 1 tablet by mouth daily.   Yes Historical Provider, MD  topiramate (TOPAMAX) 100 MG tablet Take 100 mg by mouth 2 (two) times daily.   Yes Historical Provider, MD  albuterol (PROVENTIL HFA;VENTOLIN HFA) 108 (90 BASE) MCG/ACT inhaler Inhale 2 puffs into the lungs every 6 (six) hours as needed for wheezing or shortness of breath.     Historical Provider, MD  ondansetron (ZOFRAN) 4 MG tablet Take 1 tablet (4 mg total) by mouth every 6 (six) hours as needed for nausea or vomiting. 07/31/13   10/01/13 Cheyan Frees, PA-C  sucralfate (CARAFATE) 1 GM/10ML  suspension Take 10 mLs (1 g total) by mouth 4 (four) times daily -  with meals and at bedtime. 07/31/13   Needham Biggins, PA-C   BP 119/68  Pulse 67  Temp(Src) 97 F (36.1 C) (Oral)  Resp 14  Ht 5\' 6"  (1.676 m)  Wt 280 lb (127.007 kg)  BMI 45.21 kg/m2  SpO2 98%  LMP 07/22/2013 Physical Exam  Nursing note and vitals reviewed. Constitutional: She is oriented to person, place, and time. She appears well-developed and well-nourished. No distress.  Obese  HENT:  Head: Normocephalic and atraumatic.  Mouth/Throat: Oropharynx is  clear and moist.  Eyes: Conjunctivae and EOM are normal. Pupils are equal, round, and reactive to light.  Cardiovascular: Normal rate, regular rhythm and intact distal pulses.   Pulmonary/Chest: Effort normal and breath sounds normal. No stridor. No respiratory distress. She has no wheezes. She has no rales. She exhibits no tenderness.  Abdominal: Soft. Bowel sounds are normal. She exhibits no distension and no mass. There is tenderness. There is no rebound and no guarding.  Tender palpation the right upper quadrant with no guarding or rebound.  Musculoskeletal: Normal range of motion. She exhibits no edema.  Neurological: She is alert and oriented to person, place, and time.  Psychiatric: She has a normal mood and affect.    ED Course  Procedures (including critical care time) Labs Review Labs Reviewed  COMPREHENSIVE METABOLIC PANEL - Abnormal; Notable for the following:    Total Bilirubin <0.2 (*)    GFR calc non Af Amer 75 (*)    GFR calc Af Amer 87 (*)    Anion gap 19 (*)    All other components within normal limits  URINALYSIS, ROUTINE W REFLEX MICROSCOPIC - Abnormal; Notable for the following:    Hgb urine dipstick TRACE (*)    All other components within normal limits  URINE MICROSCOPIC-ADD ON - Abnormal; Notable for the following:    Squamous Epithelial / LPF FEW (*)    All other components within normal limits  BASIC METABOLIC PANEL - Abnormal; Notable for the following:    Potassium 3.6 (*)    GFR calc non Af Amer 74 (*)    GFR calc Af Amer 86 (*)    Anion gap 17 (*)    All other components within normal limits  CBC WITH DIFFERENTIAL  LIPASE, BLOOD  PREGNANCY, URINE  TROPONIN I    Imaging Review Dg Chest 2 View  07/31/2013   CLINICAL DATA:  Abdominal pain.  Congestive heart failure.  EXAM: CHEST  2 VIEW  COMPARISON:  PA and lateral chest 12/22/2012.  FINDINGS: Lungs are clear. Heart size is normal. No pneumothorax or pleural effusion.  IMPRESSION: No acute disease.    Electronically Signed   By: 14/01/2012 M.D.   On: 07/31/2013 20:13   10/01/2013 Abdomen Complete  07/31/2013   CLINICAL DATA:  Abdominal pain for 1 month.  EXAM: ULTRASOUND ABDOMEN COMPLETE  COMPARISON:  CT abdomen and pelvis 02/02/2012.  FINDINGS: Gallbladder:  No gallstones or wall thickening visualized. No sonographic Murphy sign noted.  Common bile duct:  Diameter: 0.6 cm.  Liver:  No focal lesion or intrahepatic biliary ductal dilatation. The liver measures approximately 18.4 cm craniocaudal and demonstrates increased echogenicity.  IVC:  No abnormality visualized.  Pancreas:  Visualized portion unremarkable.  Spleen:  Size and appearance within normal limits.  Right Kidney:  Length: 13.9 cm. Echogenicity within normal limits. No mass or hydronephrosis visualized.  Left Kidney:  Length: 13.6 cm. Echogenicity within normal limits. No mass or hydronephrosis visualized.  Abdominal aorta:  No aneurysm visualized.  Other findings:  None.  IMPRESSION: Negative for gallstones.  No acute finding.  Hepatomegaly and fatty infiltration of the liver.   Electronically Signed   By: Drusilla Kanner M.D.   On: 07/31/2013 17:57     EKG Interpretation   Date/Time:  Friday July 31 2013 18:15:55 EDT Ventricular Rate:  69 PR Interval:  155 QRS Duration: 117 QT Interval:  438 QTC Calculation: 469 R Axis:   42 Text Interpretation:  Sinus rhythm Nonspecific intraventricular conduction  delay Low voltage, precordial leads Baseline wander in lead(s) V2 No  significant change was found Confirmed by Manus Gunning  MD, STEPHEN [phone]) on  07/31/2013 6:20:52 PM      MDM   Final diagnoses:  Postprandial abdominal pain in right upper quadrant  Nausea and vomiting in adult    Filed Vitals:   07/31/13 1645 07/31/13 1700 07/31/13 1816 07/31/13 1930  BP:  108/73 105/66 119/68  Pulse: 75 82  67  Temp:      TempSrc:      Resp: 15 17 14 14   Height:      Weight:      SpO2: 100% 100% 96% 98%    Medications  sodium  chloride 0.9 % bolus 1,000 mL (0 mLs Intravenous Stopped 07/31/13 2125)  ondansetron (ZOFRAN) injection 4 mg (4 mg Intravenous Given 07/31/13 1922)  famotidine (PEPCID) IVPB 20 mg (0 mg Intravenous Stopped 07/31/13 2125)  sucralfate (CARAFATE) tablet 1 g (1 g Oral Given 07/31/13 2123)    Melissa Massey is a 38 y.o. female presenting with postprandial abdominal pain worsening over the course of 2 months. Patient afebrile, abdominal exam is nonsurgical. No abnormality on labs. Ultrasound shows no gallstones. Patient is nontoxic, nonseptic appearing, in no apparent distress.  Patient's pain and other symptoms adequately managed in emergency department.  Fluid bolus given.  Labs, imaging and vitals reviewed.  Patient does not meet the SIRS or Sepsis criteria.  On repeat exam patient does not have a surgical abdomin and there are nor peritoneal signs.  No indication of appendicitis, bowel obstruction, bowel perforation, cholecystitis, diverticulitis.  Patient discharged home with symptomatic treatment and given strict instructions for follow-up with their primary care physician.  I have also discussed reasons to return immediately to the ER.  Patient expresses understanding and agrees with plan.   Patient has passed fluid challenge. Discussed case with the attending will recheck been asked to see if an  Improves after hydration.  Anion gap improve from 19-17. Patient tolerating by mouth amenable to discharge. Discussed followup with GI for H. pylori testing.  Evaluation does not show pathology that would require ongoing emergent intervention or inpatient treatment. Pt is hemodynamically stable and mentating appropriately. Discussed findings and plan with patient/guardian, who agrees with care plan. All questions answered. Return precautions discussed and outpatient follow up given.   New Prescriptions   ONDANSETRON (ZOFRAN) 4 MG TABLET    Take 1 tablet (4 mg total) by mouth every 6 (six) hours as needed  for nausea or vomiting.   SUCRALFATE (CARAFATE) 1 GM/10ML SUSPENSION    Take 10 mLs (1 g total) by mouth 4 (four) times daily -  with meals and at bedtime.         Wynetta Emery, PA-C 07/31/13 2145

## 2013-07-31 NOTE — Discharge Instructions (Signed)
Please follow with your primary care doctor in the next 2 days for a check-up. They must obtain records for further management.  ° °Do not hesitate to return to the Emergency Department for any new, worsening or concerning symptoms.  ° °

## 2013-07-31 NOTE — ED Notes (Signed)
Patient has on and off abdominal pain for a few months. Last Thursday she went to lunch, pain started with N/V/D.

## 2013-08-01 NOTE — ED Provider Notes (Signed)
Medical screening examination/treatment/procedure(s) were performed by non-physician practitioner and as supervising physician I was immediately available for consultation/collaboration.   EKG Interpretation   Date/Time:  Friday July 31 2013 18:15:55 EDT Ventricular Rate:  69 PR Interval:  155 QRS Duration: 117 QT Interval:  438 QTC Calculation: 469 R Axis:   42 Text Interpretation:  Sinus rhythm Nonspecific intraventricular conduction  delay Low voltage, precordial leads Baseline wander in lead(s) V2 No  significant change was found Confirmed by Manus Gunning  MD, Trinitie Mcgirr [phone]) on  07/31/2013 6:20:52 PM        Glynn Octave, MD 08/01/13 0028

## 2014-01-12 DIAGNOSIS — E890 Postprocedural hypothyroidism: Secondary | ICD-10-CM | POA: Insufficient documentation

## 2014-01-12 DIAGNOSIS — E282 Polycystic ovarian syndrome: Secondary | ICD-10-CM | POA: Insufficient documentation

## 2014-01-12 DIAGNOSIS — K219 Gastro-esophageal reflux disease without esophagitis: Secondary | ICD-10-CM | POA: Insufficient documentation

## 2014-01-12 DIAGNOSIS — Z9889 Other specified postprocedural states: Secondary | ICD-10-CM | POA: Insufficient documentation

## 2014-01-12 DIAGNOSIS — I152 Hypertension secondary to endocrine disorders: Secondary | ICD-10-CM | POA: Insufficient documentation

## 2014-01-12 DIAGNOSIS — I10 Essential (primary) hypertension: Secondary | ICD-10-CM | POA: Insufficient documentation

## 2014-03-05 DIAGNOSIS — G4733 Obstructive sleep apnea (adult) (pediatric): Secondary | ICD-10-CM | POA: Insufficient documentation

## 2014-03-05 DIAGNOSIS — G4710 Hypersomnia, unspecified: Secondary | ICD-10-CM | POA: Insufficient documentation

## 2014-03-25 DIAGNOSIS — G4733 Obstructive sleep apnea (adult) (pediatric): Secondary | ICD-10-CM | POA: Insufficient documentation

## 2014-04-09 DIAGNOSIS — K760 Fatty (change of) liver, not elsewhere classified: Secondary | ICD-10-CM | POA: Insufficient documentation

## 2014-04-09 DIAGNOSIS — R162 Hepatomegaly with splenomegaly, not elsewhere classified: Secondary | ICD-10-CM | POA: Insufficient documentation

## 2015-08-15 ENCOUNTER — Other Ambulatory Visit (HOSPITAL_COMMUNITY): Payer: Self-pay | Admitting: Otolaryngology

## 2015-08-15 DIAGNOSIS — J329 Chronic sinusitis, unspecified: Secondary | ICD-10-CM

## 2015-08-31 ENCOUNTER — Ambulatory Visit (HOSPITAL_COMMUNITY): Payer: PRIVATE HEALTH INSURANCE

## 2016-03-29 DIAGNOSIS — E0500 Thyrotoxicosis with diffuse goiter without thyrotoxic crisis or storm: Secondary | ICD-10-CM | POA: Insufficient documentation

## 2016-06-01 DIAGNOSIS — R600 Localized edema: Secondary | ICD-10-CM | POA: Insufficient documentation

## 2016-06-01 DIAGNOSIS — E1165 Type 2 diabetes mellitus with hyperglycemia: Secondary | ICD-10-CM | POA: Insufficient documentation

## 2017-08-21 DIAGNOSIS — F419 Anxiety disorder, unspecified: Secondary | ICD-10-CM

## 2017-08-21 HISTORY — DX: Anxiety disorder, unspecified: F41.9

## 2017-11-13 DIAGNOSIS — E559 Vitamin D deficiency, unspecified: Secondary | ICD-10-CM | POA: Insufficient documentation

## 2018-11-27 ENCOUNTER — Other Ambulatory Visit: Payer: Self-pay

## 2018-11-27 DIAGNOSIS — Z20822 Contact with and (suspected) exposure to covid-19: Secondary | ICD-10-CM

## 2018-11-29 LAB — NOVEL CORONAVIRUS, NAA: SARS-CoV-2, NAA: NOT DETECTED

## 2019-07-14 ENCOUNTER — Emergency Department (INDEPENDENT_AMBULATORY_CARE_PROVIDER_SITE_OTHER): Payer: 59

## 2019-07-14 ENCOUNTER — Encounter: Payer: Self-pay | Admitting: Emergency Medicine

## 2019-07-14 ENCOUNTER — Other Ambulatory Visit: Payer: Self-pay

## 2019-07-14 ENCOUNTER — Emergency Department
Admission: EM | Admit: 2019-07-14 | Discharge: 2019-07-14 | Disposition: A | Discharge status: Home / Self Care | Payer: PRIVATE HEALTH INSURANCE | Source: Home / Self Care | Attending: Family Medicine | Admitting: Family Medicine

## 2019-07-14 DIAGNOSIS — S60221A Contusion of right hand, initial encounter: Secondary | ICD-10-CM

## 2019-07-14 DIAGNOSIS — W230XXA Caught, crushed, jammed, or pinched between moving objects, initial encounter: Secondary | ICD-10-CM | POA: Diagnosis not present

## 2019-07-14 DIAGNOSIS — M79641 Pain in right hand: Secondary | ICD-10-CM

## 2019-07-14 LAB — URIC ACID: Uric Acid, Serum: 4.6 mg/dL (ref 2.5–7.0)

## 2019-07-14 MED ORDER — PREDNISONE 20 MG PO TABS
ORAL_TABLET | ORAL | 0 refills | Status: DC
Start: 2019-07-14 — End: 2020-03-26

## 2019-07-14 NOTE — ED Provider Notes (Signed)
[facility]    CSN: [id number] Arrival date & time: 07/14/19  4665      History   Chief Complaint Chief Complaint  Patient presents with  . Hand Pain    HPI BRYN Melissa Massey is a 44 y.o. female.   Patient states that she closed a pantry door on the dorsum of her right hand two days ago and had no significant pain initially, and no skin injury.  She developed soreness in her hand last night, with distinctly worse pain this morning, especially over the second MCP joint. Patient has a family history of gout (father).  The history is provided by the patient.  Hand Pain This is a new problem. The current episode started yesterday. The problem occurs constantly. The problem has been rapidly worsening. Exacerbated by: finger movement. Nothing relieves the symptoms. Treatments tried: Ibuprofen. The treatment provided no relief.    Past Medical History:  Diagnosis Date  . CHF (congestive heart failure) (HCC)   . DVT (deep venous thrombosis) (HCC)   . Graves disease   . PCOS (polycystic ovarian syndrome)     There are no problems to display for this patient.   Past Surgical History:  Procedure Laterality Date  . CESAREAN SECTION     x2  . LUMBAR FUSION    . TUBAL LIGATION      OB History   No obstetric history on file.      Home Medications    Prior to Admission medications   Medication Sig Start Date End Date Taking? Authorizing Provider  citalopram (CELEXA) 20 MG tablet Take 20 mg by mouth daily.   Yes [provider]  glipiZIDE (GLUCOTROL) 10 MG tablet Take 10 mg by mouth daily before breakfast.   Yes [provider]  hydrochlorothiazide (HYDRODIURIL) 25 MG tablet Take 25 mg by mouth daily.   Yes [provider]  levothyroxine (SYNTHROID) 100 MCG tablet Take 100 mcg by mouth daily before breakfast.   Yes [provider]  losartan (COZAAR) 100 MG tablet Take 100 mg by mouth daily.   Yes [provider]    Vitamin D, Ergocalciferol, (DRISDOL) 1.25 MG (50000 UNIT) CAPS capsule Take 50,000 Units by mouth every 7 (seven) days.   Yes [provider]  albuterol (PROVENTIL HFA;VENTOLIN HFA) 108 (90 BASE) MCG/ACT inhaler Inhale 2 puffs into the lungs every 6 (six) hours as needed for wheezing or shortness of breath.     [provider]  ibuprofen (ADVIL,MOTRIN) 200 MG tablet Take 800 mg by mouth 2 (two) times daily as needed (for pain).    [provider]  metFORMIN (GLUCOPHAGE) 1000 MG tablet Take 1,000 mg by mouth 2 (two) times daily with a meal.    [provider]  predniSONE (DELTASONE) 20 MG tablet Take one tab by mouth twice daily for 4 days, then one daily for 3 days. Take with food. 07/14/19   Lattie Haw, MD  topiramate (TOPAMAX) 100 MG tablet Take 100 mg by mouth 2 (two) times daily.    [provider]    Family History Family History  Problem Relation Age of Onset  . Stroke Mother   . Diabetes Mother   . Hypertension Father     Social History Social History   Tobacco Use  . Smoking status: Current Every Day Smoker    Packs/day: 1.00    Types: Cigarettes  . Smokeless tobacco: Never Used  Vaping Use  . Vaping Use: Every day  .  Substances: Nicotine  Substance Use Topics  . Alcohol use: No  . Drug use: No     Allergies   Robitussin (alcohol free) [guaifenesin]   Review of Systems Review of Systems  Constitutional: Negative for chills, diaphoresis, fatigue and fever.  Musculoskeletal: Positive for joint swelling.  Skin: Positive for color change. Negative for rash and wound.  All other systems reviewed and are negative.    Physical Exam Triage Vital Signs ED Triage Vitals  Enc Vitals Group     BP 07/14/19 0945 116/76     Pulse Rate 07/14/19 0945 75     Resp --      Temp 07/14/19 0945 98.7 F (37.1 C)     Temp Source 07/14/19 0945 Oral     SpO2 07/14/19 0945 98 %     Weight 07/14/19 0946 300 lb (136.1 kg)      Height 07/14/19 0946 5\' 6"  (1.676 m)     Head Circumference --      Peak Flow --      Pain Score 07/14/19 0945 6     Pain Loc --      Pain Edu? --      Excl. in [place]? --    No data found.  Updated Vital Signs BP 116/76 (BP Location: Right Arm)   Pulse 75   Temp 98.7 F (37.1 C) (Oral)   Ht 5\' 6"  (1.676 m)   Wt 136.1 kg   LMP 06/30/2019 (Approximate)   SpO2 98%   BMI 48.42 kg/m   Visual Acuity Right Eye Distance:   Left Eye Distance:   Bilateral Distance:    Right Eye Near:   Left Eye Near:    Bilateral Near:     Physical Exam Vitals and nursing note reviewed.  Constitutional:      General: She is not in acute distress. HENT:     Head: Normocephalic.  Eyes:     Pupils: Pupils are equal, round, and reactive to light.  Cardiovascular:     Rate and Rhythm: Normal rate.  Pulmonary:     Effort: Pulmonary effort is normal.  Musculoskeletal:     Right hand: Swelling, tenderness and bony tenderness present. No deformity or lacerations. Decreased range of motion. Decreased strength. Normal sensation. There is no disruption of two-point discrimination. Normal capillary refill. Normal pulse.       Hands:     Comments: Right hand has mild erythema and warmth dorsally.  There is distinct tenderness to palpation over the second MCP joint.  Skin:    General: Skin is warm and dry.  Neurological:     Mental Status: She is alert.      UC Treatments / Results  Labs (all labs ordered are listed, but only abnormal results are displayed) Labs Reviewed  URIC ACID    EKG   Radiology EXAM: RIGHT HAND - COMPLETE 3+ VIEW  COMPARISON:  None  FINDINGS: Osseous mineralization normal.  Non fused corticated/old ossicle at tip of ulnar styloid.  Joint spaces preserved.  No acute fracture, dislocation, or bone destruction.  Dorsal soft tissue swelling RIGHT hand.  IMPRESSION: No acute osseous abnormalities.   Electronically Signed   By: Lavonia Dana M.D.    On: 07/14/2019 11:19  Procedures Procedures (including critical care time)  Medications Ordered in UC Medications - No data to display  Initial Impression / Assessment and Plan / UC Course  I have reviewed the triage vital signs and the nursing notes.  Pertinent  labs & imaging results that were available during my care of the patient were reviewed by me and considered in my medical decision making (see chart for details).    Hand pain/swelling does not appear to be cellulitis.  No evidence fracture. Suspect initial episode of gout after hand contusion.  Uric acid pending. Begin prednisone burst/taper. Followup with Family Doctor if not improved in one week.    Final Clinical Impressions(s) / UC Diagnoses   Final diagnoses:  Right hand pain     Discharge Instructions     Increase fluid intake. May take Tylenol as needed for pain.    ED Prescriptions    Medication Sig Dispense Auth. Provider   predniSONE (DELTASONE) 20 MG tablet Take one tab by mouth twice daily for 4 days, then one daily for 3 days. Take with food. 11 tablet Lattie Haw, MD        Lattie Haw, MD 07/18/19 [phone]

## 2019-07-14 NOTE — ED Triage Notes (Signed)
Rt hand pain, pain started last night, closed it in the pantry door 2 days ago, but it did not hurt at the time, woke up this morning with severe pain.

## 2019-07-14 NOTE — Discharge Instructions (Addendum)
Increase fluid intake.  May take Tylenol as needed for pain. 

## 2020-02-03 DIAGNOSIS — J4542 Moderate persistent asthma with status asthmaticus: Secondary | ICD-10-CM | POA: Insufficient documentation

## 2020-03-26 ENCOUNTER — Other Ambulatory Visit: Payer: Self-pay

## 2020-03-26 ENCOUNTER — Emergency Department
Admission: EM | Admit: 2020-03-26 | Discharge: 2020-03-26 | Disposition: A | Discharge status: Home / Self Care | Payer: 59 | Source: Home / Self Care

## 2020-03-26 ENCOUNTER — Encounter: Payer: Self-pay | Admitting: Emergency Medicine

## 2020-03-26 DIAGNOSIS — J209 Acute bronchitis, unspecified: Secondary | ICD-10-CM

## 2020-03-26 DIAGNOSIS — J0140 Acute pansinusitis, unspecified: Secondary | ICD-10-CM | POA: Diagnosis not present

## 2020-03-26 MED ORDER — FLUCONAZOLE 150 MG PO TABS: ORAL_TABLET | ORAL | 0 refills | Status: DC

## 2020-03-26 MED ORDER — PREDNISONE 10 MG (21) PO TBPK: ORAL_TABLET | Freq: Every day | ORAL | 0 refills | Status: AC

## 2020-03-26 MED ORDER — AMOXICILLIN-POT CLAVULANATE 875-125 MG PO TABS: 1.0000 | ORAL_TABLET | Freq: Two times a day (BID) | ORAL | 0 refills | Status: AC

## 2020-03-26 NOTE — ED Triage Notes (Signed)
Patient states that she has another sinus infection.  Patient has recurrent infections, has polyps in her sinus, scheduled to see an ENT next month.  Patient has facial swelling, teeth hurt, congestion.  Patient has taken Ibuprofen at 4am.  Patient is vaccinated.

## 2020-03-26 NOTE — Discharge Instructions (Signed)
I have sent in Augmentin for you to take twice a day for 10 days.  I have sent in a prednisone taper for you to take for 6 days. 6 tablets on day one, 5 tablets on day two, 4 tablets on day three, 3 tablets on day four, 2 tablets on day five, and 1 tablet on day six.  I have sent in fluconazole in case of yeast. Take one tablet at the onset of symptoms. If symptoms are still present in 3 days, take the second tablet.   Follow up with this office or with primary care if symptoms are persisting.  Follow up in the ER for high fever, trouble swallowing, trouble breathing, other concerning symptoms.

## 2020-03-26 NOTE — ED Provider Notes (Signed)
[facility]   [id number] 03/26/20 Arrival Time: 0827  ZY:SAYT THROAT  SUBJECTIVE: History from: patient.  Melissa Massey is a 45 y.o. female who presents with abrupt onset of nasal congestion, headache, fatigue, sore throat, cough, purulent nasal discharge for the last week. Reports facial swelling and sinus tenderness over the last 2 days. Has hx nasal polyps and recurrent sinus infections. Has history of asthma, Graves disease, CHF, diabetes. Denies sick exposure to Covid, strep, flu or mono, or precipitating event. Has postitive history of Covid in 12/2019. Has had Covid vaccines. Has tried flonase, sinus rinses, OTC cough and cold without relief. There are no aggravating factors. Reports previous symptoms in the past.     Denies fever, chills, rhinorrhea, SOB, wheezing, chest pain, nausea, rash, changes in bowel or bladder habits.     ROS: As per HPI.  All other pertinent ROS negative.     Past Medical History:  Diagnosis Date  . CHF (congestive heart failure) (HCC)   . DVT (deep venous thrombosis) (HCC)   . Graves disease   . PCOS (polycystic ovarian syndrome)    Past Surgical History:  Procedure Laterality Date  . CESAREAN SECTION     x2  . LUMBAR FUSION    . TUBAL LIGATION     No Known Allergies No current facility-administered medications on file prior to encounter.   Current Outpatient Medications on File Prior to Encounter  Medication Sig Dispense Refill  . albuterol (PROVENTIL HFA;VENTOLIN HFA) 108 (90 BASE) MCG/ACT inhaler Inhale 2 puffs into the lungs every 6 (six) hours as needed for wheezing or shortness of breath.     . citalopram (CELEXA) 20 MG tablet Take 20 mg by mouth daily.    Marland Kitchen glipiZIDE (GLUCOTROL) 10 MG tablet Take 10 mg by mouth daily before breakfast.    . hydrochlorothiazide (HYDRODIURIL) 25 MG tablet Take 25 mg by mouth daily.    Marland Kitchen ibuprofen (ADVIL,MOTRIN) 200 MG tablet Take 800 mg by mouth 2 (two) times daily as needed (for pain).     Marland Kitchen levothyroxine (SYNTHROID) 100 MCG tablet Take 100 mcg by mouth daily before breakfast.    . losartan (COZAAR) 100 MG tablet Take 100 mg by mouth daily.    . metFORMIN (GLUCOPHAGE) 1000 MG tablet Take 1,000 mg by mouth 2 (two) times daily with a meal.    . Semaglutide,0.25 or 0.5MG /DOS, (OZEMPIC, 0.25 OR 0.5 MG/DOSE,) 2 MG/1.5ML SOPN Inject into the skin.    Marland Kitchen topiramate (TOPAMAX) 100 MG tablet Take 100 mg by mouth 2 (two) times daily.    . Vitamin D, Ergocalciferol, (DRISDOL) 1.25 MG (50000 UNIT) CAPS capsule Take 50,000 Units by mouth every 7 (seven) days.     Social History   Socioeconomic History  . Marital status: Single    Spouse name: Not on file  . Number of children: Not on file  . Years of education: Not on file  . Highest education level: Not on file  Occupational History  . Not on file  Tobacco Use  . Smoking status: Current Every Day Smoker    Packs/day: 1.00    Types: Cigarettes  . Smokeless tobacco: Never Used  Vaping Use  . Vaping Use: Every day  . Substances: Nicotine  Substance and Sexual Activity  . Alcohol use: No  . Drug use: No  . Sexual activity: Yes    Birth control/protection: Surgical  Other Topics Concern  . Not on file  Social History Narrative  . Not  on file   Social Determinants of Health   Financial Resource Strain: Not on file  Food Insecurity: Not on file  Transportation Needs: Not on file  Physical Activity: Not on file  Stress: Not on file  Social Connections: Not on file  Intimate Partner Violence: Not on file   Family History  Problem Relation Age of Onset  . Stroke Mother   . Diabetes Mother   . Hypertension Father     OBJECTIVE:  Vitals:   03/26/20 0842  BP: (!) 148/87  Pulse: 78  Temp: 99.1 F (37.3 C)  TempSrc: Oral  SpO2: 100%     General appearance: alert; appears fatigued, but nontoxic, speaking in full sentences and managing own secretions, bilateral facial swelling over maxillary sinuses HEENT: NCAT;  Ears: EACs clear, TMs pearly gray with visible cone of light, without erythema; Eyes: PERRL, EOMI grossly; Nose: no obvious rhinorrhea; Throat: oropharynx clear, tonsils 1+ and mildly erythematous without white tonsillar exudates, uvula midline; Sinuses: maxillary and frontal sinuses tender to palpation Neck: supple without LAD Lungs: mild diffuse wheezing throughout bilateral lung fields; cough absent Heart: regular rate and rhythm.  Radial pulses 2+ symmetrical bilaterally Skin: warm and dry Psychological: alert and cooperative; normal mood and affect  LABS: No results found for this or any previous visit (from the past 24 hour(s)).   ASSESSMENT & PLAN:  1. Acute non-recurrent pansinusitis   2. Acute bronchitis, unspecified organism     Meds ordered this encounter  Medications  . predniSONE (STERAPRED UNI-PAK 21 TAB) 10 MG (21) TBPK tablet    Sig: Take by mouth daily for 6 days. Take 6 tablets on day 1, 5 tablets on day 2, 4 tablets on day 3, 3 tablets on day 4, 2 tablets on day 5, 1 tablet on day 6    Dispense:  21 tablet    Refill:  0    Order Specific Question:   Supervising Provider    Answer:   Merrilee Jansky [id number]  . amoxicillin-clavulanate (AUGMENTIN) 875-125 MG tablet    Sig: Take 1 tablet by mouth 2 (two) times daily for 10 days.    Dispense:  20 tablet    Refill:  0    Order Specific Question:   Supervising Provider    Answer:   Merrilee Jansky [id number]  . fluconazole (DIFLUCAN) 150 MG tablet    Sig: Take one tablet at the onset of symptoms. If symptoms are still present 3 days later, take the second tablet.    Dispense:  2 tablet    Refill:  0    Order Specific Question:   Supervising Provider    Answer:   Merrilee Jansky [id number]    Acute Sinusitis Push fluids and get rest Prescribed amoxicillin 875mg  twice daily for 10 days.  Prescribed steroid taper Prescribed fluconazole in case of yeast.  Take as directed and to completion.  Drink warm or  cool liquids, use throat lozenges, or popsicles to help alleviate symptoms Take OTC ibuprofen or tylenol as needed for pain May use Zyrtec D and flonase to help alleviate symptoms Follow up with ENT as scheduled Return or go to ER if you have any new or worsening symptoms such as fever, chills, nausea, vomiting, worsening sore throat, cough, abdominal pain, chest pain, changes in bowel or bladder habits.   Reviewed expectations re: course of current medical issues. Questions answered. Outlined signs and symptoms indicating need for more acute intervention. Patient verbalized understanding. After Visit  Summary given.          Moshe Cipro, NP 03/26/20 [phone]

## 2020-04-19 ENCOUNTER — Emergency Department
Admission: EM | Admit: 2020-04-19 | Discharge: 2020-04-19 | Disposition: A | Discharge status: Home / Self Care | Payer: 59 | Source: Home / Self Care | Attending: Family Medicine | Admitting: Family Medicine

## 2020-04-19 ENCOUNTER — Emergency Department (INDEPENDENT_AMBULATORY_CARE_PROVIDER_SITE_OTHER): Payer: 59

## 2020-04-19 ENCOUNTER — Encounter: Payer: Self-pay | Admitting: Emergency Medicine

## 2020-04-19 ENCOUNTER — Other Ambulatory Visit: Payer: Self-pay

## 2020-04-19 DIAGNOSIS — J209 Acute bronchitis, unspecified: Secondary | ICD-10-CM

## 2020-04-19 DIAGNOSIS — R059 Cough, unspecified: Secondary | ICD-10-CM

## 2020-04-19 DIAGNOSIS — J0101 Acute recurrent maxillary sinusitis: Secondary | ICD-10-CM

## 2020-04-19 DIAGNOSIS — J3489 Other specified disorders of nose and nasal sinuses: Secondary | ICD-10-CM

## 2020-04-19 DIAGNOSIS — R0789 Other chest pain: Secondary | ICD-10-CM

## 2020-04-19 MED ORDER — METHYLPREDNISOLONE SODIUM SUCC 125 MG IJ SOLR
80.0000 mg | Freq: Once | INTRAMUSCULAR | Status: AC
  Administered 2020-04-19: 80 mg via INTRAMUSCULAR

## 2020-04-19 MED ORDER — PREDNISONE 20 MG PO TABS
ORAL_TABLET | ORAL | 0 refills | Status: DC
Start: 2020-04-19 — End: 2021-03-05

## 2020-04-19 MED ORDER — DOXYCYCLINE HYCLATE 100 MG PO CAPS
ORAL_CAPSULE | ORAL | 0 refills | Status: DC
Start: 2020-04-19 — End: 2021-03-22

## 2020-04-19 NOTE — ED Triage Notes (Signed)
Nasal congestion,recurrent sinus infections, polyps in sinuses, seeing ENT on 04/24/20, bi-lateral ear pain x 1.5 months Vaccinated

## 2020-04-19 NOTE — ED Provider Notes (Signed)
[facility]    CSN: [id number] Arrival date & time: 04/19/20  1157      History   Chief Complaint Chief Complaint  Patient presents with  . Nasal Congestion    HPI LATIQUA Massey is a 45 y.o. female.   Patient complains of persistent nasal congestion, bilateral ear pain, and facial pressure for 1.5 months.  She has a history of nasal polyps and recurring sinusitis, and will be seeing her ENT on 04/24/20.  She also complains of persistent cough and chest tightness for about a month, but denies pleuritic pain and fever.  She uses Breo and albuterol inhalers at home. She continues to smoke.  The history is provided by the patient.    Past Medical History:  Diagnosis Date  . CHF (congestive heart failure) (HCC)   . DVT (deep venous thrombosis) (HCC)   . Graves disease   . PCOS (polycystic ovarian syndrome)     There are no problems to display for this patient.   Past Surgical History:  Procedure Laterality Date  . CESAREAN SECTION     x2  . LUMBAR FUSION    . TUBAL LIGATION      OB History   No obstetric history on file.      Home Medications    Prior to Admission medications   Medication Sig Start Date End Date Taking? Authorizing Provider  doxycycline (VIBRAMYCIN) 100 MG capsule Take one cap PO Q12hr with food. 04/19/20  Yes Lattie Haw, MD  predniSONE (DELTASONE) 20 MG tablet Take one tab by mouth twice daily for 4 days, then one daily for 3 days. Take with food. 04/19/20  Yes Lattie Haw, MD  albuterol (PROVENTIL HFA;VENTOLIN HFA) 108 (90 BASE) MCG/ACT inhaler Inhale 2 puffs into the lungs every 6 (six) hours as needed for wheezing or shortness of breath.     [provider]  citalopram (CELEXA) 20 MG tablet Take 20 mg by mouth daily.    [provider]  fluconazole (DIFLUCAN) 150 MG tablet Take one tablet at the onset of symptoms. If symptoms are still present 3 days later, take the second tablet. 03/26/20   Moshe Cipro, NP  glipiZIDE (GLUCOTROL) 10 MG tablet Take 10 mg by mouth daily before breakfast.    [provider]  hydrochlorothiazide (HYDRODIURIL) 25 MG tablet Take 25 mg by mouth daily.    [provider]  ibuprofen (ADVIL,MOTRIN) 200 MG tablet Take 800 mg by mouth 2 (two) times daily as needed (for pain).    [provider]  levothyroxine (SYNTHROID) 100 MCG tablet Take 100 mcg by mouth daily before breakfast.    [provider]  losartan (COZAAR) 100 MG tablet Take 100 mg by mouth daily.    [provider]  metFORMIN (GLUCOPHAGE) 1000 MG tablet Take 1,000 mg by mouth 2 (two) times daily with a meal.    [provider]  Semaglutide,0.25 or 0.5MG /DOS, (OZEMPIC, 0.25 OR 0.5 MG/DOSE,) 2 MG/1.5ML SOPN Inject into the skin. 07/01/18   [provider]  topiramate (TOPAMAX) 100 MG tablet Take 100 mg by mouth 2 (two) times daily.    [provider]  Vitamin D, Ergocalciferol, (DRISDOL) 1.25 MG (50000 UNIT) CAPS capsule Take 50,000 Units by mouth every 7 (seven) days.    [provider]    Family History Family History  Problem Relation Age of Onset  . Stroke Mother   . Diabetes Mother   . Hypertension Father  Social History Social History   Tobacco Use  . Smoking status: Current Every Day Smoker    Packs/day: 1.00    Types: Cigarettes  . Smokeless tobacco: Never Used  Vaping Use  . Vaping Use: Every day  . Substances: Nicotine  Substance Use Topics  . Alcohol use: No  . Drug use: No     Allergies   Patient has no known allergies.   Review of Systems Review of Systems No sore throat + cough No pleuritic pain, but feels tight in chest ? wheezing + nasal congestion + post-nasal drainage + sinus pain/pressure No itchy/red eyes + earache No hemoptysis + SOB with activity No fever/chills No nausea No vomiting No abdominal pain No diarrhea No urinary symptoms No skin rash + fatigue No  myalgias No headache   Physical Exam Triage Vital Signs ED Triage Vitals  Enc Vitals Group     BP 04/19/20 1214 119/82     Pulse Rate 04/19/20 1214 71     Resp 04/19/20 1214 (!) 22     Temp 04/19/20 1214 98.3 F (36.8 C)     Temp Source 04/19/20 1214 Oral     SpO2 04/19/20 1214 98 %     Weight 04/19/20 1215 295 lb (133.8 kg)     Height 04/19/20 1215 5\' 6"  (1.676 m)     Head Circumference --      Peak Flow --      Pain Score 04/19/20 1214 0     Pain Loc --      Pain Edu? --      Excl. in [place]? --    No data found.  Updated Vital Signs BP 119/82 (BP Location: Right Arm)   Pulse 71   Temp 98.3 F (36.8 C) (Oral)   Resp (!) 22   Ht 5\' 6"  (1.676 m)   Wt 133.8 kg   LMP 04/19/2020   SpO2 98%   BMI 47.61 kg/m   Visual Acuity Right Eye Distance:   Left Eye Distance:   Bilateral Distance:    Right Eye Near:   Left Eye Near:    Bilateral Near:     Physical Exam Nursing notes and Vital Signs reviewed. Appearance:  Patient appears stated age, and in no acute distress Eyes:  Pupils are equal, round, and reactive to light and accomodation.  Extraocular movement is intact.  Conjunctivae are not inflamed  Ears:  Canals normal.  Tympanic membranes normal.  Nose:  Congested turbinates.  Maxillary sinus tenderness is present.  Pharynx:  Normal Neck:  Supple.  No adenopathy   Lungs:  Scattered bilateral rhonchi.  Breath sounds are equal.  Moving air well. Heart:  Regular rate and rhythm without murmurs, rubs, or gallops.  Abdomen:  Nontender without masses or hepatosplenomegaly.  Bowel sounds are present.  No CVA or flank tenderness.  Extremities:  No edema.  Skin:  No rash present.   UC Treatments / Results  Labs (all labs ordered are listed, but only abnormal results are displayed) Labs Reviewed - No data to display  EKG   Radiology DG Sinuses Complete  Result Date: 04/19/2020 CLINICAL DATA:  Sinus pressure. EXAM: PARANASAL SINUSES - COMPLETE 3 + VIEW COMPARISON:   None. FINDINGS: The paranasal sinus are aerated. There is no evidence of sinus opacification air-fluid levels or mucosal thickening. No significant bone abnormalities are seen. IMPRESSION: Negative. Electronically Signed   By: 04/21/2020 M.D.   On: 04/19/2020 14:15   DG Chest  2 View  Result Date: 04/19/2020 CLINICAL DATA:  Cough and chest tightness for 1 month. EXAM: CHEST - 2 VIEW COMPARISON:  PA and lateral chest 07/31/2013. FINDINGS: The chest is hyperexpanded with peribronchial thickening, unchanged. No consolidative process, pneumothorax or effusion. Heart size is normal. No acute or focal bony abnormality. IMPRESSION: Findings compatible with COPD.  No acute disease. Electronically Signed   By: Drusilla Kanner M.D.   On: 04/19/2020 14:15    Procedures Procedures (including critical care time)  Medications Ordered in UC Medications  methylPREDNISolone sodium succinate (SOLU-MEDROL) 125 mg/2 mL injection 80 mg (has no administration in time range)    Initial Impression / Assessment and Plan / UC Course  I have reviewed the triage vital signs and the nursing notes.  Pertinent labs & imaging results that were available during my care of the patient were reviewed by me and considered in my medical decision making (see chart for details).    Administered Solumedrol  IM, then begin prednisone burst/taper.  Begin doxycycline. Followup with ENT as scheduled.   Final Clinical Impressions(s) / UC Diagnoses   Final diagnoses:  Acute recurrent maxillary sinusitis  Acute bronchitis, unspecified organism     Discharge Instructions     Begin prednisone Wednesday 04/20/20.  Continue Flonase and nasal irrigation.  Continue Breo inhaler, albuterol inhaler and nebulizer treatments.      ED Prescriptions    Medication Sig Dispense Auth. Provider   doxycycline (VIBRAMYCIN) 100 MG capsule Take one cap PO Q12hr with food. 14 capsule Lattie Haw, MD   predniSONE (DELTASONE) 20 MG  tablet Take one tab by mouth twice daily for 4 days, then one daily for 3 days. Take with food. 11 tablet Lattie Haw, MD        Lattie Haw, MD 04/21/20 [phone]

## 2020-04-19 NOTE — Discharge Instructions (Addendum)
Begin prednisone Wednesday 04/20/20.  Continue Flonase and nasal irrigation.  Continue Breo inhaler, albuterol inhaler and nebulizer treatments.

## 2020-04-27 ENCOUNTER — Other Ambulatory Visit: Payer: Self-pay | Admitting: Otolaryngology

## 2020-04-27 DIAGNOSIS — J329 Chronic sinusitis, unspecified: Secondary | ICD-10-CM

## 2020-05-11 ENCOUNTER — Ambulatory Visit
Admission: RE | Admit: 2020-05-11 | Discharge: 2020-05-11 | Disposition: A | Discharge status: Home / Self Care | Payer: 59 | Source: Ambulatory Visit | Attending: Otolaryngology | Admitting: Otolaryngology

## 2020-05-11 DIAGNOSIS — J329 Chronic sinusitis, unspecified: Secondary | ICD-10-CM

## 2020-12-12 DIAGNOSIS — M0579 Rheumatoid arthritis with rheumatoid factor of multiple sites without organ or systems involvement: Secondary | ICD-10-CM | POA: Insufficient documentation

## 2020-12-30 DIAGNOSIS — F900 Attention-deficit hyperactivity disorder, predominantly inattentive type: Secondary | ICD-10-CM | POA: Insufficient documentation

## 2021-03-05 ENCOUNTER — Other Ambulatory Visit: Payer: Self-pay

## 2021-03-05 ENCOUNTER — Emergency Department
Admission: EM | Admit: 2021-03-05 | Discharge: 2021-03-05 | Disposition: A | Discharge status: Home / Self Care | Payer: 59 | Source: Home / Self Care

## 2021-03-05 ENCOUNTER — Encounter: Payer: Self-pay | Admitting: Family Medicine

## 2021-03-05 DIAGNOSIS — M069 Rheumatoid arthritis, unspecified: Secondary | ICD-10-CM | POA: Diagnosis not present

## 2021-03-05 HISTORY — DX: Unspecified osteoarthritis, unspecified site: M19.90

## 2021-03-05 MED ORDER — METHYLPREDNISOLONE SODIUM SUCC 125 MG IJ SOLR
125.0000 mg | Freq: Once | INTRAMUSCULAR | Status: AC
  Administered 2021-03-05: 125 mg via INTRAMUSCULAR

## 2021-03-05 MED ORDER — KETOROLAC TROMETHAMINE 60 MG/2ML IM SOLN
60.0000 mg | Freq: Once | INTRAMUSCULAR | Status: AC
  Administered 2021-03-05: 60 mg via INTRAMUSCULAR

## 2021-03-05 MED ORDER — PREDNISONE 20 MG PO TABS: ORAL_TABLET | ORAL | 0 refills | Status: DC

## 2021-03-05 NOTE — Discharge Instructions (Addendum)
Instructed patient to start oral Prednisone taper tomorrow Monday, 03/06/2021.  Advised patient to take medication as directed with food to completion.  Encouraged patient to increase daily water intake while taking this medication.

## 2021-03-05 NOTE — ED Provider Notes (Signed)
[facility]    CSN: [id number] Arrival date & time: 03/05/21  0915      History   Chief Complaint Chief Complaint  Patient presents with   arthritis flare    HPI ORBA LAFLAIR is a 46 y.o. female.   HPI 46 year old female presents with rheumatoid arthritis flare 2 days, primarily hands and wrists.  PMH significant for morbid obesity, CHF, DVT and current every day cigarette smoker.  Additionally, patient is followed by PCP now for rheumatoid arthritis.  Patient reports was initially followed by Rheumatology for rheumatoid arthritis; however, insurance changed and now she is waiting to reestablish with Rheumatology which will not be until May of this year.  Past Medical History:  Diagnosis Date   Arthritis    CHF (congestive heart failure) (HCC)    DVT (deep venous thrombosis) (HCC)    Graves disease    PCOS (polycystic ovarian syndrome)     There are no problems to display for this patient.   Past Surgical History:  Procedure Laterality Date   CESAREAN SECTION     x2   LUMBAR FUSION     TUBAL LIGATION      OB History   No obstetric history on file.      Home Medications    Prior to Admission medications   Medication Sig Start Date End Date Taking? Authorizing Provider  celecoxib (CELEBREX) 200 MG capsule Take 200 mg by mouth 2 (two) times daily.   Yes [provider]  Dulaglutide (TRULICITY [place]) Inject into the skin.   Yes [provider]  predniSONE (DELTASONE) 20 MG tablet Take 3 tabs PO daily x 3 days, then 2 tabs PO daily x 3 days, then 1 tab PO daily x 3 days 03/05/21  Yes Eliezer Lofts, FNP  SULFADIAZINE PO Take by mouth.   Yes [provider]  venlafaxine (EFFEXOR) 75 MG tablet Take 75 mg by mouth 2 (two) times daily.   Yes [provider]  albuterol (PROVENTIL HFA;VENTOLIN HFA) 108 (90 BASE) MCG/ACT inhaler Inhale 2 puffs into the lungs every 6 (six) hours as needed for wheezing or shortness of breath.      [provider]  citalopram (CELEXA) 20 MG tablet Take 20 mg by mouth daily. Patient not taking: Reported on 03/05/2021    [provider]  doxycycline (VIBRAMYCIN) 100 MG capsule Take one cap PO Q12hr with food. Patient not taking: Reported on 03/05/2021 04/19/20   Kandra Nicolas, MD  fluconazole (DIFLUCAN) 150 MG tablet Take one tablet at the onset of symptoms. If symptoms are still present 3 days later, take the second tablet. Patient not taking: Reported on 03/05/2021 03/26/20   Faustino Congress, NP  glipiZIDE (GLUCOTROL) 10 MG tablet Take 10 mg by mouth daily before breakfast.    [provider]  hydrochlorothiazide (HYDRODIURIL) 25 MG tablet Take 25 mg by mouth daily.    [provider]  ibuprofen (ADVIL,MOTRIN) 200 MG tablet Take 800 mg by mouth 2 (two) times daily as needed (for pain). Patient not taking: Reported on 03/05/2021    [provider]  levothyroxine (SYNTHROID) 100 MCG tablet Take 100 mcg by mouth daily before breakfast.    [provider]  losartan (COZAAR) 100 MG tablet Take 100 mg by mouth daily.    [provider]  metFORMIN (GLUCOPHAGE) 1000 MG tablet Take 1,000 mg by mouth 2 (two) times daily with a meal.    [provider]  Semaglutide,0.25 or  0.5MG /DOS, (OZEMPIC, 0.25 OR 0.5 MG/DOSE,) 2 MG/1.5ML SOPN Inject into the skin. Patient not taking: Reported on 03/05/2021 07/01/18   [provider]  topiramate (TOPAMAX) 100 MG tablet Take 100 mg by mouth 2 (two) times daily. Patient not taking: Reported on 03/05/2021    [provider]  Vitamin D, Ergocalciferol, (DRISDOL) 1.25 MG (50000 UNIT) CAPS capsule Take 50,000 Units by mouth every 7 (seven) days.    [provider]    Family History Family History  Problem Relation Age of Onset   Stroke Mother    Diabetes Mother    Hypertension Father     Social History Social History   Tobacco Use   Smoking status: Every Day     Packs/day: 1.00    Types: Cigarettes   Smokeless tobacco: Never  Vaping Use   Vaping Use: Every day   Substances: Nicotine  Substance Use Topics   Alcohol use: No   Drug use: No     Allergies   Patient has no known allergies.   Review of Systems Review of Systems  Musculoskeletal:  Positive for arthralgias.    Physical Exam Triage Vital Signs ED Triage Vitals  Enc Vitals Group     BP 03/05/21 0924 125/77     Pulse Rate 03/05/21 0924 74     Resp 03/05/21 0924 16     Temp 03/05/21 0924 98.5 F (36.9 C)     Temp Source 03/05/21 0924 Oral     SpO2 03/05/21 0924 98 %     Weight 03/05/21 0926 296 lb (134.3 kg)     Height --      Head Circumference --      Peak Flow --      Pain Score 03/05/21 0925 8     Pain Loc --      Pain Edu? --      Excl. in [place]? --    No data found.  Updated Vital Signs BP 125/77 (BP Location: Left Arm)    Pulse 74    Temp 98.5 F (36.9 C) (Oral)    Resp 16    Wt 296 lb (134.3 kg)    SpO2 98%    BMI 47.78 kg/m        Physical Exam Vitals and nursing note reviewed.  Constitutional:      General: She is not in acute distress.    Appearance: She is obese. She is not ill-appearing.  HENT:     Head: Normocephalic and atraumatic.     Mouth/Throat:     Mouth: Mucous membranes are moist.     Pharynx: Oropharynx is clear.  Eyes:     Extraocular Movements: Extraocular movements intact.     Conjunctiva/sclera: Conjunctivae normal.     Pupils: Pupils are equal, round, and reactive to light.  Cardiovascular:     Rate and Rhythm: Normal rate and regular rhythm.     Pulses: Normal pulses.     Heart sounds: Normal heart sounds.  Pulmonary:     Effort: Pulmonary effort is normal.     Breath sounds: Normal breath sounds.  Musculoskeletal:        General: Normal range of motion.     Cervical back: Normal range of motion and neck supple.  Skin:    General: Skin is warm and dry.  Neurological:     General: No focal deficit present.     Mental  Status: She is alert and oriented to person, place, and time.  UC Treatments / Results  Labs (all labs ordered are listed, but only abnormal results are displayed) Labs Reviewed - No data to display  EKG   Radiology No results found.  Procedures Procedures (including critical care time)  Medications Ordered in UC Medications  ketorolac (TORADOL) injection 60 mg (60 mg Intramuscular Given 03/05/21 0952)  methylPREDNISolone sodium succinate (SOLU-MEDROL) 125 mg/2 mL injection 125 mg (125 mg Intramuscular Given 03/05/21 0952)    Initial Impression / Assessment and Plan / UC Course  I have reviewed the triage vital signs and the nursing notes.  Pertinent labs & imaging results that were available during my care of the patient were reviewed by me and considered in my medical decision making (see chart for details).     MDM: 1.  Rheumatoid arthritis flare-IM Solu-Medrol 125 mg, IM Toradol 60 mg given once in clinic today, Rx'd Prednisone taper. Instructed patient to start oral Prednisone taper tomorrow Monday, 03/06/2021.  Advised patient to take medication as directed with food to completion.  Encouraged patient to increase daily water intake while taking this medication.  Patient discharged home, hemodynamically stable. Final Clinical Impressions(s) / UC Diagnoses   Final diagnoses:  Rheumatoid arthritis flare [place])     Discharge Instructions      Instructed patient to start oral Prednisone taper tomorrow Monday, 03/06/2021.  Advised patient to take medication as directed with food to completion.  Encouraged patient to increase daily water intake while taking this medication.     ED Prescriptions     Medication Sig Dispense Auth. Provider   predniSONE (DELTASONE) 20 MG tablet Take 3 tabs PO daily x 3 days, then 2 tabs PO daily x 3 days, then 1 tab PO daily x 3 days 18 tablet Eliezer Lofts, FNP      I have reviewed the PDMP during this encounter.   Eliezer Lofts,  [place] 03/05/21 [phone]

## 2021-03-05 NOTE — ED Triage Notes (Signed)
Pt presents with RA flair. Pain has been increasing over two days.

## 2021-03-22 ENCOUNTER — Emergency Department
Admission: EM | Admit: 2021-03-22 | Discharge: 2021-03-22 | Disposition: A | Discharge status: Home / Self Care | Payer: 59 | Source: Home / Self Care

## 2021-03-22 ENCOUNTER — Other Ambulatory Visit: Payer: Self-pay

## 2021-03-22 DIAGNOSIS — M069 Rheumatoid arthritis, unspecified: Secondary | ICD-10-CM | POA: Diagnosis not present

## 2021-03-22 MED ORDER — METHYLPREDNISOLONE 4 MG PO TBPK: ORAL_TABLET | ORAL | 0 refills | Status: DC

## 2021-03-22 MED ORDER — KETOROLAC TROMETHAMINE 60 MG/2ML IM SOLN
60.0000 mg | Freq: Once | INTRAMUSCULAR | Status: AC
  Administered 2021-03-22: 60 mg via INTRAMUSCULAR

## 2021-03-22 MED ORDER — METHYLPREDNISOLONE SODIUM SUCC 125 MG IJ SOLR
125.0000 mg | Freq: Once | INTRAMUSCULAR | Status: AC
  Administered 2021-03-22: 125 mg via INTRAMUSCULAR

## 2021-03-22 NOTE — Discharge Instructions (Addendum)
Instructed patient to start Medrol Dosepak tomorrow, Thursday, 03/23/2021.  Instructed patient to take medication as directed with food to completion.  Encouraged patient to increase daily water intake while taking this medication. ?

## 2021-03-22 NOTE — ED Triage Notes (Signed)
Pt  states that she was recently seen for arthritis pain. Pt states that she is still having pain. X2 weeks ? ?Pt states that she is in between insurance and has a new pcp appt in April 2023. ?

## 2021-03-22 NOTE — ED Provider Notes (Signed)
?[facility] ? ? ? ?CSN: [id number] ?Arrival date & time: 03/22/21  [id number] ? ? ?  ? ?History   ?Chief Complaint ?Chief Complaint  ?Patient presents with  ? Joint Pain  ?  Pt states that she is still having some joint pain. X2 weeks  ? ? ?HPI ?Melissa Massey is a 46 y.o. female.  ? ?HPI 46 year old female presents with joint pain (bilateral hands/fingers/left knee) for 2 weeks.  PMH significant for CHF, DVT, and arthritis.  Patient evaluated by me on 03/05/2021 for rheumatoid arthritis flare and given Solu-Medrol 125 mg and Toradol 60 mg both IM along with prednisone taper.  Patient returns with continued joint pain.  Patient reports follow-up with her new rheumatologist in April 2023.  Patient reports that Solu-Medrol/Toradol injection in clinic plus prednisone taper helped her immensely with the joint pain involving bilateral hands fingers and left knee on 03/05/2021. ? ?Past Medical History:  ?Diagnosis Date  ? Arthritis   ? CHF (congestive heart failure) ([place])   ? DVT (deep venous thrombosis) ([place])   ? Graves disease   ? PCOS (polycystic ovarian syndrome)   ? ? ?There are no problems to display for this patient. ? ? ?Past Surgical History:  ?Procedure Laterality Date  ? CESAREAN SECTION    ? x2  ? LUMBAR FUSION    ? TUBAL LIGATION    ? ? ?OB History   ?No obstetric history on file. ?  ? ? ? ?Home Medications   ? ?Prior to Admission medications   ?Medication Sig Start Date End Date Taking? Authorizing Provider  ?albuterol (PROVENTIL HFA;VENTOLIN HFA) 108 (90 BASE) MCG/ACT inhaler Inhale 2 puffs into the lungs every 6 (six) hours as needed for wheezing or shortness of breath.    Yes [provider]  ?celecoxib (CELEBREX) 200 MG capsule Take 200 mg by mouth 2 (two) times daily.   Yes [provider]  ?citalopram (CELEXA) 20 MG tablet Take 20 mg by mouth daily.   Yes [provider]  ?Dulaglutide (TRULICITY [place]) Inject into the skin.   Yes [provider]  ?glipiZIDE  (GLUCOTROL) 10 MG tablet Take 10 mg by mouth daily before breakfast.   Yes [provider]  ?hydrochlorothiazide (HYDRODIURIL) 25 MG tablet Take 25 mg by mouth daily.   Yes [provider]  ?ibuprofen (ADVIL,MOTRIN) 200 MG tablet Take 800 mg by mouth 2 (two) times daily as needed (for pain).   Yes [provider]  ?levothyroxine (SYNTHROID) 100 MCG tablet Take 100 mcg by mouth daily before breakfast.   Yes [provider]  ?losartan (COZAAR) 100 MG tablet Take 100 mg by mouth daily.   Yes [provider]  ?metFORMIN (GLUCOPHAGE) 1000 MG tablet Take 1,000 mg by mouth 2 (two) times daily with a meal.   Yes [provider]  ?methylPREDNISolone (MEDROL DOSEPAK) 4 MG TBPK tablet Take as directed. 03/22/21  Yes Eliezer Lofts, FNP  ?Semaglutide,0.25 or 0.5MG /DOS, (OZEMPIC, 0.25 OR 0.5 MG/DOSE,) 2 MG/1.5ML SOPN Inject into the skin. 07/01/18  Yes [provider]  ?SULFADIAZINE PO Take by mouth.   Yes [provider]  ?topiramate (TOPAMAX) 100 MG tablet Take 100 mg by mouth 2 (two) times daily.   Yes [provider]  ?venlafaxine (EFFEXOR) 75 MG tablet Take 75 mg by mouth 2 (two) times daily.   Yes [provider]  ?Vitamin D, Ergocalciferol, (DRISDOL) 1.25 MG (50000 UNIT) CAPS capsule Take 50,000 Units by mouth every 7 (seven)  days.   Yes [provider]  ? ? ?Family History ?Family History  ?Problem Relation Age of Onset  ? Stroke Mother   ? Diabetes Mother   ? Hypertension Father   ? ? ?Social History ?Social History  ? ?Tobacco Use  ? Smoking status: Every Day  ?  Packs/day: 1.00  ?  Types: Cigarettes  ? Smokeless tobacco: Never  ?Vaping Use  ? Vaping Use: Every day  ? Substances: Nicotine  ?Substance Use Topics  ? Alcohol use: No  ? Drug use: No  ? ? ? ?Allergies   ?Patient has no known allergies. ? ? ?Review of Systems ?Review of Systems  ?Musculoskeletal:  Positive for arthralgias.  ? ? ?Physical Exam ?Triage Vital  Signs ?ED Triage Vitals  ?Enc Vitals Group  ?   BP 03/22/21 0844 119/84  ?   Pulse Rate 03/22/21 0844 72  ?   Resp 03/22/21 0844 18  ?   Temp 03/22/21 0844 98.4 ?F (36.9 ?C)  ?   Temp Source 03/22/21 0844 Oral  ?   SpO2 03/22/21 0844 95 %  ?   Weight 03/22/21 0841 296 lb (134.3 kg)  ?   Height 03/22/21 0841 5\' 6"  (1.676 m)  ?   Head Circumference --   ?   Peak Flow --   ?   Pain Score 03/22/21 0841 7  ?   Pain Loc --   ?   Pain Edu? --   ?   Excl. in [place]? --   ? ?No data found. ? ?Updated Vital Signs ?BP 119/84 (BP Location: Right Arm)   Pulse 72   Temp 98.4 ?F (36.9 ?C) (Oral)   Resp 18   Ht 5\' 6"  (1.676 m)   Wt 296 lb (134.3 kg)   LMP 03/20/2021   SpO2 95%   BMI 47.78 kg/m?  ? ? ? ?Physical Exam ?Vitals and nursing note reviewed.  ?Constitutional:   ?   General: She is not in acute distress. ?   Appearance: She is obese. She is not ill-appearing.  ?HENT:  ?   Head: Normocephalic and atraumatic.  ?   Mouth/Throat:  ?   Mouth: Mucous membranes are moist.  ?   Pharynx: Oropharynx is clear.  ?Eyes:  ?   Extraocular Movements: Extraocular movements intact.  ?   Conjunctiva/sclera: Conjunctivae normal.  ?   Pupils: Pupils are equal, round, and reactive to light.  ?Cardiovascular:  ?   Rate and Rhythm: Normal rate and regular rhythm.  ?   Pulses: Normal pulses.  ?   Heart sounds: Normal heart sounds.  ?Pulmonary:  ?   Effort: Pulmonary effort is normal.  ?   Breath sounds: Normal breath sounds.  ?Musculoskeletal:     ?   General: Normal range of motion.  ?   Cervical back: Normal range of motion and neck supple.  ?Skin: ?   General: Skin is warm and dry.  ?Neurological:  ?   General: No focal deficit present.  ?   Mental Status: She is alert and oriented to person, place, and time. Mental status is at baseline.  ? ? ? ?UC Treatments / Results  ?Labs ?(all labs ordered are listed, but only abnormal results are displayed) ?Labs Reviewed - No data to display ? ?EKG ? ? ?Radiology ?No results  found. ? ?Procedures ?Procedures (including critical care time) ? ?Medications Ordered in UC ?Medications  ?methylPREDNISolone sodium succinate (SOLU-MEDROL) 125 mg/2 mL injection 125 mg (  has no administration in time range)  ?ketorolac (TORADOL) injection 60 mg (has no administration in time range)  ? ? ?Initial Impression / Assessment and Plan / UC Course  ?I have reviewed the triage vital signs and the nursing notes. ? ?Pertinent labs & imaging results that were available during my care of the patient were reviewed by me and considered in my medical decision making (see chart for details). ? ?  ? ?MDM: 1.  Rheumatoid arthritis flare-IM Solu-Medrol 125 mg, IM Toradol 60 mg given in clinic prior to discharge.  Rx'd Medrol Dosepak. Instructed patient to start Medrol Dosepak tomorrow, Thursday, 03/23/2021.  Instructed patient to take medication as directed with food to completion.  Encouraged patient to increase daily water intake while taking this medication.  Patient discharged home, hemodynamically stable. ?Final Clinical Impressions(s) / UC Diagnoses  ? ?Final diagnoses:  ?Rheumatoid arthritis flare (HCC)  ? ? ? ?Discharge Instructions   ? ?  ?Instructed patient to start Medrol Dosepak tomorrow, Thursday, 03/23/2021.  Instructed patient to take medication as directed with food to completion.  Encouraged patient to increase daily water intake while taking this medication. ? ? ? ? ?ED Prescriptions   ? ? Medication Sig Dispense Auth. Provider  ? methylPREDNISolone (MEDROL DOSEPAK) 4 MG TBPK tablet Take as directed. 1 each Eliezer Lofts, FNP  ? ?  ? ?PDMP not reviewed this encounter. ?  ?Eliezer Lofts, [place] ?03/22/21 [id number] ? ?

## 2021-04-09 ENCOUNTER — Other Ambulatory Visit: Payer: Self-pay

## 2021-04-09 ENCOUNTER — Encounter (HOSPITAL_COMMUNITY): Payer: Self-pay

## 2021-04-09 ENCOUNTER — Emergency Department (HOSPITAL_COMMUNITY)
Admission: EM | Admit: 2021-04-09 | Discharge: 2021-04-09 | Disposition: A | Discharge status: Home / Self Care | Payer: 59 | Attending: Emergency Medicine | Admitting: Emergency Medicine

## 2021-04-09 DIAGNOSIS — M069 Rheumatoid arthritis, unspecified: Secondary | ICD-10-CM | POA: Insufficient documentation

## 2021-04-09 DIAGNOSIS — Z7984 Long term (current) use of oral hypoglycemic drugs: Secondary | ICD-10-CM | POA: Diagnosis not present

## 2021-04-09 DIAGNOSIS — I509 Heart failure, unspecified: Secondary | ICD-10-CM | POA: Insufficient documentation

## 2021-04-09 DIAGNOSIS — M25531 Pain in right wrist: Secondary | ICD-10-CM | POA: Diagnosis not present

## 2021-04-09 DIAGNOSIS — E119 Type 2 diabetes mellitus without complications: Secondary | ICD-10-CM | POA: Diagnosis not present

## 2021-04-09 HISTORY — DX: Type 2 diabetes mellitus without complications: E11.9

## 2021-04-09 MED ORDER — METHYLPREDNISOLONE SODIUM SUCC 125 MG IJ SOLR
125.0000 mg | Freq: Once | INTRAMUSCULAR | Status: AC
  Administered 2021-04-09: 125 mg via INTRAMUSCULAR
  Filled 2021-04-09: qty 2

## 2021-04-09 MED ORDER — KETOROLAC TROMETHAMINE 60 MG/2ML IM SOLN
60.0000 mg | Freq: Once | INTRAMUSCULAR | Status: AC
  Administered 2021-04-09: 60 mg via INTRAMUSCULAR
  Filled 2021-04-09: qty 2

## 2021-04-09 MED ORDER — METHYLPREDNISOLONE 4 MG PO TBPK: ORAL_TABLET | ORAL | 0 refills | Status: DC

## 2021-04-09 NOTE — ED Provider Notes (Addendum)
?[facility] EMERGENCY DEPARTMENT ?Provider Note ? ? ?CSN: [id number] ?Arrival date & time: 04/09/21  0501 ? ?  ? ?History ? ?Chief Complaint  ?Patient presents with  ? rheumatoid arthritis flare  ? ? ?Melissa Massey is a 46 y.o. female. ? ?HPI ? ?46 year old female with a medical history significant for rheumatoid arthritis, Graves' disease, PCOS, CHF, DM 2, DVT not on anticoagulation who presents to the emergency department with concern for rheumatoid arthritis flare.  The patient states that she was diagnosed with rheumatoid arthritis last fall by her previous rheumatologist.  She did been on outpatient immunosuppression but her insurance changed and she had to switch rheumatologists in January and has since not been on any immunosuppression.  She states Celebrex for occasional flares.  In the last 3 months she has had 2 visits to urgent care for rheumatoid arthritis flares with associated polyarthralgias and swelling which resolved following IM Toradol and a steroid taper.  She presents today with polyarthralgias and joint swelling consistent with prior rheumatoid arthritis flares.  She denies any fevers or chills.  Her bilateral ankles, bilateral wrists, bilateral MCP joints. ? ?Home Medications ?Prior to Admission medications   ?Medication Sig Start Date End Date Taking? Authorizing Provider  ?albuterol (PROVENTIL HFA;VENTOLIN HFA) 108 (90 BASE) MCG/ACT inhaler Inhale 2 puffs into the lungs every 6 (six) hours as needed for wheezing or shortness of breath.    Yes [provider]  ?Earlie Server 200-25 MCG/ACT AEPB Inhale 1 puff into the lungs daily. 11/24/20  Yes [provider]  ?celecoxib (CELEBREX) 200 MG capsule Take 200 mg by mouth 2 (two) times daily.   Yes [provider]  ?CONCERTA 36 MG CR tablet Take 36 mg by mouth every morning. 12/27/20  Yes [provider]  ?cyclobenzaprine (FLEXERIL) 5 MG tablet Take 5 mg by mouth at bedtime as needed for muscle  spasms. 12/27/20  Yes [provider]  ?esomeprazole (NEXIUM) 40 MG capsule Take 40 mg by mouth 2 (two) times daily. 11/29/20  Yes [provider]  ?glipiZIDE (GLUCOTROL) 5 MG tablet Take 5 mg by mouth 2 (two) times daily before a meal.   Yes [provider]  ?hydrochlorothiazide (HYDRODIURIL) 25 MG tablet Take 25 mg by mouth daily.   Yes [provider]  ?levothyroxine (SYNTHROID) 125 MCG tablet Take 250 mcg by mouth daily. 02/23/21  Yes [provider]  ?liothyronine (CYTOMEL) 5 MCG tablet Take 5 mcg by mouth 2 (two) times daily. 02/23/21  Yes [provider]  ?losartan (COZAAR) 100 MG tablet Take 100 mg by mouth daily.   Yes [provider]  ?metFORMIN (GLUCOPHAGE-XR) 500 MG 24 hr tablet Take 1,000 mg by mouth in the morning and at bedtime.   Yes [provider]  ?methylPREDNISolone (MEDROL DOSEPAK) 4 MG TBPK tablet Take as directed on the package. 04/09/21  Yes Ernie Avena, MD  ?montelukast (SINGULAIR) 10 MG tablet Take 10 mg by mouth daily. 02/23/21  Yes [provider]  ?TRULICITY 1.5 MG/0.5ML SOPN Inject 1.5 mg into the skin every Sunday. 12/27/20  Yes [provider]  ?venlafaxine XR (EFFEXOR-XR) 150 MG 24 hr capsule Take 150 mg by mouth daily with breakfast.   Yes [provider]  ?Vitamin D, Ergocalciferol, (DRISDOL) 1.25 MG (50000 UNIT) CAPS capsule Take 50,000 Units by mouth every Sunday.   Yes [provider]  ?   ? ?Allergies    ?Patient has no known allergies.   ? ?Review  of Systems   ?Review of Systems  ?Musculoskeletal:  Positive for arthralgias and joint swelling.  ?All other systems reviewed and are negative. ? ?Physical Exam ?Updated Vital Signs ?BP 133/86   Pulse 87   Temp 98.3 ?F (36.8 ?C) (Oral)   Resp 18   Ht  (1.676 m)   Wt 131.5 kg   LMP 03/20/2021   SpO2 99%   BMI 46.81 kg/m?  ?Physical Exam ?Vitals and nursing note reviewed.  ?Constitutional:   ?   General: She is not in acute  distress. ?   Appearance: She is well-developed.  ?   Comments: Tearful  ?HENT:  ?   Head: Normocephalic and atraumatic.  ?Eyes:  ?   Conjunctiva/sclera: Conjunctivae normal.  ?Cardiovascular:  ?   Rate and Rhythm: Normal rate and regular rhythm.  ?   Heart sounds: No murmur heard. ?Pulmonary:  ?   Effort: Pulmonary effort is normal. No respiratory distress.  ?   Breath sounds: Normal breath sounds.  ?Abdominal:  ?   Palpations: Abdomen is soft.  ?   Tenderness: There is no abdominal tenderness.  ?Musculoskeletal:     ?   General: Swelling and tenderness present.  ?   Cervical back: Neck supple.  ?   Comments: Tenderness to palpation of the bilateral MCP joints, wrists bilaterally with some pain with range of motion, tenderness of the ankle bilaterally with associated mild swelling, 2+ DP pulses bilaterally, 2+ radial pulses bilaterally  ?Skin: ?   General: Skin is warm and dry.  ?   Capillary Refill: Capillary refill takes less than 2 seconds.  ?Neurological:  ?   Mental Status: She is alert.  ?Psychiatric:     ?   Mood and Affect: Mood normal. Affect is tearful.  ? ? ?ED Results / Procedures / Treatments   ?Labs ?(all labs ordered are listed, but only abnormal results are displayed) ?Labs Reviewed - No data to display ? ?EKG ?None ? ?Radiology ?No results found. ? ?Procedures ?Procedures  ? ? ?Medications Ordered in ED ?Medications  ?ketorolac (TORADOL) injection 60 mg (60 mg Intramuscular Given 04/09/21 0738)  ?methylPREDNISolone sodium succinate (SOLU-MEDROL) 125 mg/2 mL injection 125 mg (125 mg Intramuscular Given 04/09/21 0741)  ? ? ?ED Course/ Medical Decision Making/ A&P ?  ?                        ?Medical Decision Making ?Risk ?Prescription drug management. ? ? ?46 year old female with a medical history significant for rheumatoid arthritis, Graves' disease, PCOS, CHF, DM 2, DVT not on anticoagulation who presents to the emergency department with concern for rheumatoid arthritis flare.  The patient states  that she was diagnosed with rheumatoid arthritis last fall by her previous rheumatologist.  She did been on outpatient immunosuppression but her insurance changed and she had to switch rheumatologists in January and has since not been on any immunosuppression.  She states Celebrex for occasional flares.  In the last 3 months she has had 2 visits to urgent care for rheumatoid arthritis flares with associated polyarthralgias and swelling which resolved following IM Toradol and a steroid taper.  She presents today with polyarthralgias and joint swelling consistent with prior rheumatoid arthritis flares.  She denies any fevers or chills.  Her bilateral ankles, bilateral wrists, bilateral MCP joints. ? ?On arrival, the patient was vitally stable.  Sinus rhythm noted on cardiac telemetry.  Presenting with polyarthralgias with similar findings on physical exam  consistent with likely known diagnosis of rheumatoid arthritis and rheumatoid arthritis flares. Low concern for septic arthritis at this time. Patient was administered 125 mg of IM Solu-Medrol and 60 mg of IM Toradol.  She will be prescribed a Medrol Dosepak.  She was advised to follow-up with her PCP and outpatient with rheumatology.  A referral was placed for follow-up with rheumatology in clinic. ? ? ?Final Clinical Impression(s) / ED Diagnoses ?Final diagnoses:  ?Rheumatoid arthritis involving multiple sites, unspecified whether rheumatoid factor present (HCC)  ? ? ?Rx / DC Orders ?ED Discharge Orders   ? ?      Ordered  ?  methylPREDNISolone (MEDROL DOSEPAK) 4 MG TBPK tablet       ? 04/09/21 0750  ? ?  ?  ? ?  ? ? ? ? ?  ?Ernie AvenaLawsing, Trang Bouse, MD ?04/09/21 [phone] ? ?

## 2021-04-09 NOTE — ED Notes (Signed)
Patient discharge instructions reviewed with the patient. The patient verbalized understanding of instructions. Patient discharged. 

## 2021-04-09 NOTE — ED Triage Notes (Signed)
Complaining of pain in the bilateral wrist, knees and ankles, and left elbow. Started the beginning of February, off and on with runs of prednisone ?

## 2021-04-09 NOTE — Discharge Instructions (Signed)
You were evaluated in the Emergency Department and after careful evaluation, we did not find any emergent condition requiring admission or further testing in the hospital. ? ?Your exam today was consistent with a rheumatoid arthritis flare. We will treat with a Medrol Dose Pack. Follow-up with your PCP until you can be seen outpatient by a rheumatologist. You will likely benefit from initiation of immunosuppressive therapy outpatient. ? ?Please return to the Emergency Department if you experience any worsening of your condition.  Thank you for allowing [place] to be a part of your care. ? ?

## 2021-05-06 ENCOUNTER — Emergency Department (INDEPENDENT_AMBULATORY_CARE_PROVIDER_SITE_OTHER)
Admission: EM | Admit: 2021-05-06 | Discharge: 2021-05-06 | Disposition: A | Discharge status: Home / Self Care | Payer: 59 | Source: Home / Self Care | Attending: Family Medicine | Admitting: Family Medicine

## 2021-05-06 ENCOUNTER — Encounter: Payer: Self-pay | Admitting: Emergency Medicine

## 2021-05-06 DIAGNOSIS — M069 Rheumatoid arthritis, unspecified: Secondary | ICD-10-CM | POA: Diagnosis not present

## 2021-05-06 DIAGNOSIS — M2559 Pain in other specified joint: Secondary | ICD-10-CM | POA: Diagnosis not present

## 2021-05-06 MED ORDER — KETOROLAC TROMETHAMINE 60 MG/2ML IM SOLN
60.0000 mg | Freq: Once | INTRAMUSCULAR | Status: AC
  Administered 2021-05-06: 60 mg via INTRAMUSCULAR

## 2021-05-06 MED ORDER — METHYLPREDNISOLONE SODIUM SUCC 125 MG IJ SOLR
80.0000 mg | Freq: Once | INTRAMUSCULAR | Status: AC
  Administered 2021-05-06: 80 mg via INTRAMUSCULAR

## 2021-05-06 MED ORDER — PREDNISONE 10 MG PO TABS: ORAL_TABLET | ORAL | 0 refills | Status: DC

## 2021-05-06 NOTE — ED Provider Notes (Signed)
?[facility] ? ? ? ?CSN: [id number] ?Arrival date & time: 05/06/21  1451 ? ? ?  ? ?History   ?Chief Complaint ?Chief Complaint  ?Patient presents with  ? RA Flare  ? ? ?HPI ?Melissa Massey is a 46 y.o. female.  ? ?HPI ? ?Chart is reviewed.  Patient has known rheumatoid arthritis.  She is in between physicians because her insurance changed.  She has a new primary care appointment for next week.  She has a rheumatology appointment for May.  Unfortunately, she keeps having flares of her rheumatoid arthritis because she is not on her Enbrel until she can have a new rheumatologist who prescribes it and gets approval through her insurance.  She is here for another rheumatoid flare.  This is the fourth urgent and emergency visit this year. ? ?Past Medical History:  ?Diagnosis Date  ? Arthritis   ? CHF (congestive heart failure) (HCC)   ? Diabetes mellitus without complication (HCC)   ? DVT (deep venous thrombosis) (HCC)   ? Graves disease   ? PCOS (polycystic ovarian syndrome)   ? ? ?There are no problems to display for this patient. ? ? ?Past Surgical History:  ?Procedure Laterality Date  ? CESAREAN SECTION    ? x2  ? LUMBAR FUSION    ? TUBAL LIGATION    ? ? ?OB History   ?No obstetric history on file. ?  ? ? ? ?Home Medications   ? ?Prior to Admission medications   ?Medication Sig Start Date End Date Taking? Authorizing Provider  ?albuterol (PROVENTIL HFA;VENTOLIN HFA) 108 (90 BASE) MCG/ACT inhaler Inhale 2 puffs into the lungs every 6 (six) hours as needed for wheezing or shortness of breath.    Yes [provider]  ?Earlie ServerBREO ELLIPTA 200-25 MCG/ACT AEPB Inhale 1 puff into the lungs daily. 11/24/20  Yes [provider]  ?celecoxib (CELEBREX) 200 MG capsule Take 200 mg by mouth 2 (two) times daily.   Yes [provider]  ?CONCERTA 36 MG CR tablet Take 36 mg by mouth every morning. 12/27/20  Yes [provider]  ?cyclobenzaprine (FLEXERIL) 5 MG tablet Take 5 mg by mouth at bedtime  as needed for muscle spasms. 12/27/20  Yes [provider]  ?esomeprazole (NEXIUM) 40 MG capsule Take 40 mg by mouth 2 (two) times daily. 11/29/20  Yes [provider]  ?glipiZIDE (GLUCOTROL) 5 MG tablet Take 5 mg by mouth 2 (two) times daily before a meal.   Yes [provider]  ?hydrochlorothiazide (HYDRODIURIL) 25 MG tablet Take 25 mg by mouth daily.   Yes [provider]  ?levothyroxine (SYNTHROID) 125 MCG tablet Take 250 mcg by mouth daily. 02/23/21  Yes [provider]  ?liothyronine (CYTOMEL) 5 MCG tablet Take 5 mcg by mouth 2 (two) times daily. 02/23/21  Yes [provider]  ?losartan (COZAAR) 100 MG tablet Take 100 mg by mouth daily.   Yes [provider]  ?metFORMIN (GLUCOPHAGE-XR) 500 MG 24 hr tablet Take 1,000 mg by mouth in the morning and at bedtime.   Yes [provider]  ?montelukast (SINGULAIR) 10 MG tablet Take 10 mg by mouth daily. 02/23/21  Yes [provider]  ?predniSONE (DELTASONE) 10 MG tablet Take 4 pills a day for 3 days, then 3 pills a day for 3 days, then 2 pills a day for 1 week 05/06/21  Yes Eustace MooreNelson, Gershom Brobeck Sue, MD  ?TRULICITY 1.5 MG/0.5ML SOPN Inject 1.5 mg into the skin every Sunday. 12/27/20  Yes [provider]  ?venlafaxine XR (EFFEXOR-XR) 150 MG 24 hr capsule Take 150 mg by mouth daily with breakfast.   Yes [provider]  ?Vitamin D, Ergocalciferol, (DRISDOL) 1.25 MG (50000 UNIT) CAPS capsule Take 50,000 Units by mouth every Sunday.   Yes [provider]  ? ? ?Family History ?Family History  ?Problem Relation Age of Onset  ? Stroke Mother   ? Diabetes Mother   ? Hypertension Father   ? ? ?Social History ?Social History  ? ?Tobacco Use  ? Smoking status: Every Day  ?  Packs/day: 1.00  ?  Types: Cigarettes  ? Smokeless tobacco: Never  ?Vaping Use  ? Vaping Use: Every day  ? Substances: Nicotine  ?Substance Use Topics  ? Alcohol use: No  ? Drug use: No  ? ? ? ?Allergies   ?Patient has  no known allergies. ? ? ?Review of Systems ?Review of Systems ?See HPI ? ?Physical Exam ?Triage Vital Signs ?ED Triage Vitals  ?Enc Vitals Group  ?   BP 05/06/21 1532 117/78  ?   Pulse Rate 05/06/21 1532 80  ?   Resp 05/06/21 1532 18  ?   Temp 05/06/21 1532 98.6 ?F (37 ?C)  ?   Temp Source 05/06/21 1532 Oral  ?   SpO2 05/06/21 1532 97 %  ?   Weight 05/06/21 1533 296 lb (134.3 kg)  ?   Height 05/06/21 1533 5\' 6"  (1.676 m)  ?   Head Circumference --   ?   Peak Flow --   ?   Pain Score 05/06/21 1532 8  ?   Pain Loc --   ?   Pain Edu? --   ?   Excl. in GC? --   ? ?No data found. ? ?Updated Vital Signs ?BP 117/78 (BP Location: Right Arm)   Pulse 80   Temp 98.6 ?F (37 ?C) (Oral)   Resp 18   Ht 5\' 6"  (1.676 m)   Wt 134.3 kg   LMP 05/05/2021   SpO2 97%   BMI 47.78 kg/m?  ?   ? ?Physical Exam ?Constitutional:   ?   General: She is not in acute distress. ?   Appearance: She is well-developed. She is obese. She is ill-appearing.  ?HENT:  ?   Head: Normocephalic and atraumatic.  ?Eyes:  ?   Conjunctiva/sclera: Conjunctivae normal.  ?   Pupils: Pupils are equal, round, and reactive to light.  ?Cardiovascular:  ?   Rate and Rhythm: Normal rate.  ?Pulmonary:  ?   Effort: Pulmonary effort is normal. No respiratory distress.  ?Abdominal:  ?   General: There is no distension.  ?   Palpations: Abdomen is soft.  ?Musculoskeletal:     ?   General: Swelling and tenderness present. Normal range of motion.  ?   Cervical back: Normal range of motion.  ?   Comments: Obvious synovitis of the MP joints across both hands  ?Skin: ?   General: Skin is warm and dry.  ?Neurological:  ?   General: No focal deficit present.  ?   Mental Status: She is alert.  ?Psychiatric:     ?   Mood and Affect: Mood normal.     ?   Behavior: Behavior normal.  ? ? ? ?UC Treatments / Results  ?Labs ?(all labs ordered are listed, but only abnormal results are displayed) ?Labs Reviewed - No data to display ? ?EKG ? ? ?Radiology ?No results  found. ? ?  Procedures ?Procedures (including critical care time) ? ?Medications Ordered in UC ?Medications  ?methylPREDNISolone sodium succinate (SOLU-MEDROL) 125 mg/2 mL injection 80 mg (80 mg Intramuscular Given 05/06/21 1600)  ?ketorolac (TORADOL) injection 60 mg (60 mg Intramuscular Given 05/06/21 1600)  ? ? ?Initial Impression / Assessment and Plan / UC Course  ?I have reviewed the triage vital signs and the nursing notes. ? ?Pertinent labs & imaging results that were available during my care of the patient were reviewed by me and considered in my medical decision making (see chart for details). ? ?Instead of a Dosepak I propose a slower taper ?Follow-up with primary care next week for discussion of long-term steroids ?Final Clinical Impressions(s) / UC Diagnoses  ? ?Final diagnoses:  ?Pain in other joint  ?Flare of rheumatoid arthritis (HCC)  ? ? ? ?Discharge Instructions   ? ?  ?Take prednisone as directed.  Start prednisone tomorrow ?Keep your appointment with primary care ? ? ?ED Prescriptions   ? ? Medication Sig Dispense Auth. Provider  ? predniSONE (DELTASONE) 10 MG tablet Take 4 pills a day for 3 days, then 3 pills a day for 3 days, then 2 pills a day for 1 week 35 tablet Eustace Moore, MD  ? ?  ? ?PDMP not reviewed this encounter. ?  ?Eustace Moore, MD ?05/06/21 1630 ? ?

## 2021-05-06 NOTE — Discharge Instructions (Signed)
Take prednisone as directed.  Start prednisone tomorrow ?Keep your appointment with primary care ?

## 2021-05-06 NOTE — ED Triage Notes (Signed)
Patient states that she is having a RA flare which has gradually been coming on, became worse last night.  Patient is having swelling in hands, knees and ankles.  Patient feels that this is stress induced as she lost her mom on 04/18/2021.  Scheduled to see Rheumatology 06/19/2021.  ?

## 2021-05-15 ENCOUNTER — Ambulatory Visit: Payer: Self-pay | Admitting: Family Medicine

## 2021-05-16 ENCOUNTER — Ambulatory Visit (HOSPITAL_BASED_OUTPATIENT_CLINIC_OR_DEPARTMENT_OTHER): Payer: 59 | Admitting: Nurse Practitioner

## 2021-05-30 ENCOUNTER — Other Ambulatory Visit (HOSPITAL_BASED_OUTPATIENT_CLINIC_OR_DEPARTMENT_OTHER): Payer: Self-pay | Admitting: Nurse Practitioner

## 2021-05-30 ENCOUNTER — Encounter (HOSPITAL_BASED_OUTPATIENT_CLINIC_OR_DEPARTMENT_OTHER): Payer: Self-pay | Admitting: Nurse Practitioner

## 2021-05-30 ENCOUNTER — Ambulatory Visit (INDEPENDENT_AMBULATORY_CARE_PROVIDER_SITE_OTHER): Payer: 59 | Admitting: Nurse Practitioner

## 2021-05-30 VITALS — BP 128/82 | HR 86 | Ht 66.0 in | Wt 299.9 lb

## 2021-05-30 DIAGNOSIS — J4542 Moderate persistent asthma with status asthmaticus: Secondary | ICD-10-CM

## 2021-05-30 DIAGNOSIS — E282 Polycystic ovarian syndrome: Secondary | ICD-10-CM

## 2021-05-30 DIAGNOSIS — E1165 Type 2 diabetes mellitus with hyperglycemia: Secondary | ICD-10-CM

## 2021-05-30 DIAGNOSIS — E559 Vitamin D deficiency, unspecified: Secondary | ICD-10-CM | POA: Diagnosis not present

## 2021-05-30 DIAGNOSIS — E0500 Thyrotoxicosis with diffuse goiter without thyrotoxic crisis or storm: Secondary | ICD-10-CM | POA: Diagnosis not present

## 2021-05-30 DIAGNOSIS — Z0000 Encounter for general adult medical examination without abnormal findings: Secondary | ICD-10-CM

## 2021-05-30 DIAGNOSIS — M0579 Rheumatoid arthritis with rheumatoid factor of multiple sites without organ or systems involvement: Secondary | ICD-10-CM

## 2021-05-30 DIAGNOSIS — F39 Unspecified mood [affective] disorder: Secondary | ICD-10-CM | POA: Diagnosis not present

## 2021-05-30 DIAGNOSIS — F419 Anxiety disorder, unspecified: Secondary | ICD-10-CM

## 2021-05-30 DIAGNOSIS — I10 Essential (primary) hypertension: Secondary | ICD-10-CM | POA: Diagnosis not present

## 2021-05-30 DIAGNOSIS — K760 Fatty (change of) liver, not elsewhere classified: Secondary | ICD-10-CM

## 2021-05-30 DIAGNOSIS — G4733 Obstructive sleep apnea (adult) (pediatric): Secondary | ICD-10-CM

## 2021-05-30 DIAGNOSIS — R162 Hepatomegaly with splenomegaly, not elsewhere classified: Secondary | ICD-10-CM

## 2021-05-30 DIAGNOSIS — E890 Postprocedural hypothyroidism: Secondary | ICD-10-CM | POA: Diagnosis not present

## 2021-05-30 DIAGNOSIS — F339 Major depressive disorder, recurrent, unspecified: Secondary | ICD-10-CM | POA: Insufficient documentation

## 2021-05-30 DIAGNOSIS — I509 Heart failure, unspecified: Secondary | ICD-10-CM | POA: Insufficient documentation

## 2021-05-30 DIAGNOSIS — E538 Deficiency of other specified B group vitamins: Secondary | ICD-10-CM | POA: Diagnosis not present

## 2021-05-30 DIAGNOSIS — F900 Attention-deficit hyperactivity disorder, predominantly inattentive type: Secondary | ICD-10-CM

## 2021-05-30 DIAGNOSIS — G4710 Hypersomnia, unspecified: Secondary | ICD-10-CM

## 2021-05-30 DIAGNOSIS — K219 Gastro-esophageal reflux disease without esophagitis: Secondary | ICD-10-CM

## 2021-05-30 DIAGNOSIS — R69 Illness, unspecified: Secondary | ICD-10-CM | POA: Diagnosis not present

## 2021-05-30 LAB — POCT GLYCOSYLATED HEMOGLOBIN (HGB A1C): Hemoglobin A1C: 7.7 % — AB (ref 4.0–5.6)

## 2021-05-30 MED ORDER — AMPHETAMINE-DEXTROAMPHET ER 20 MG PO CP24: 40.0000 mg | ORAL_CAPSULE | Freq: Every day | ORAL | 0 refills | Status: DC

## 2021-05-30 MED ORDER — BUSPIRONE HCL 7.5 MG PO TABS: 7.5000 mg | ORAL_TABLET | Freq: Three times a day (TID) | ORAL | 2 refills | Status: DC

## 2021-05-30 MED ORDER — ALBUTEROL SULFATE HFA 108 (90 BASE) MCG/ACT IN AERS: 2.0000 | INHALATION_SPRAY | Freq: Four times a day (QID) | RESPIRATORY_TRACT | 3 refills | Status: DC | PRN

## 2021-05-30 MED ORDER — TIRZEPATIDE 2.5 MG/0.5ML ~~LOC~~ SOAJ: 2.5000 mg | SUBCUTANEOUS | 0 refills | Status: DC

## 2021-05-30 MED ORDER — AMPHETAMINE-DEXTROAMPHET ER 20 MG PO CP24: 40.0000 mg | ORAL_CAPSULE | ORAL | 0 refills | Status: DC

## 2021-05-30 MED ORDER — PREDNISONE 10 MG PO TABS: ORAL_TABLET | ORAL | 0 refills | Status: AC

## 2021-05-30 MED ORDER — ALBUTEROL SULFATE (2.5 MG/3ML) 0.083% IN NEBU: 2.5000 mg | INHALATION_SOLUTION | RESPIRATORY_TRACT | 6 refills | Status: DC | PRN

## 2021-05-30 NOTE — Patient Instructions (Addendum)
Thank you for choosing [facility] at [facility] for your Primary Care needs. I am excited for the opportunity to partner with you to meet your health care goals. It was a pleasure meeting you today!  Recommendations from today's visit: I have sent in the prednisone for you. You can go ahead and taper down to 10mg  for 2 weeks and then 5mg  for two weeks. I would ask Dr. Dierdre Forth to see if you can come off at that point.  We will check your thyroid today.  Your A1c was 7.7% today- this is great with the recent prednisone. You are doing an amazing job! We will try the mounjaro - I have sent this in and we will see if we can get insurance coverage for it. Keep taking the glipizide and metformin.  I have sent the referral to GI I have sent in the Adderall for you. I can send three months at a time to be picked up once a month. Let me know if this does not seem to work for you.  We will add buspar to the effexor and see if this helps with the anxiety and depression symptoms that are not resolved. You can take this scheduled twice a day and the extra dose you can take as needed during the day.  We will check your labs today and make sure everything looks ok.  If vitamin b12 is low, we will plan to send that in as an injection.   Information on diet, exercise, and health maintenance recommendations are listed below. This is information to help you be sure you are on track for optimal health and monitoring.   Please look over this and let us know if you have any questions or if you have completed any of the health maintenance outside of [facility] so that we can be sure your records are up to date.  ___________________________________________________________ About Me: I am an Adult-Geriatric Nurse Practitioner with a background in caring for patients for more than 20 years with a strong intensive care background. I provide primary care and sports medicine services to patients age 75 and  older within this office. My education had a strong focus on caring for the older adult population, which I am passionate about. I am also the director of the APP Fellowship with [facility].   My desire is to provide you with the best service through preventive medicine and supportive care. I consider you a part of the medical team and value your input. I work diligently to ensure that you are heard and your needs are met in a safe and effective manner. I want you to feel comfortable with me as your provider and want you to know that your health concerns are important to me.  For your information, our office hours are: Monday, Tuesday, and Thursday 8:00 AM - 5:00 PM Wednesday and Friday 8:00 AM - 12:00 PM.   In my time away from the office I am teaching new APP's within the system and am unavailable, but my partner, Dr. Ihor Dow is in the office for emergent needs.   If you have questions or concerns, please call our office at [phone] or send [place] a MyChart message and we will respond as quickly as possible.  ____________________________________________________________ MyChart:  For all urgent or time sensitive needs we ask that you please call the office to avoid delays. Our number is [phone]. MyChart is not constantly monitored and due to the large volume of  messages a day, replies may take up to 72 business hours.  MyChart Policy: MyChart allows for you to see your visit notes, after visit summary, provider recommendations, lab and tests results, make an appointment, request refills, and contact your provider or the office for non-urgent questions or concerns. Providers are seeing patients during normal business hours and do not have built in time to review MyChart messages.  We ask that you allow a minimum of 3 business days for responses to [employer]. For this reason, please do not send urgent requests through MyChart. Please call the office at [phone]. New and ongoing  conditions may require a visit. We have virtual and in person visit available for your convenience.  Complex MyChart concerns may require a visit. Your provider may request you schedule a virtual or in person visit to ensure we are providing the best care possible. MyChart messages sent after 11:00 AM on Friday will not be received by the provider until Monday morning.    Lab and Test Results: You will receive your lab and test results on MyChart as soon as they are completed and results have been sent by the lab or testing facility. Due to this service, you will receive your results BEFORE your provider.  I review lab and tests results each morning prior to seeing patients. Some results require collaboration with other providers to ensure you are receiving the most appropriate care. For this reason, we ask that you please allow a minimum of 3-5 business days from the time the ALL results have been received for your provider to receive and review lab and test results and contact you about these.  Most lab and test result comments from the provider will be sent through MyChart. Your provider may recommend changes to the plan of care, follow-up visits, repeat testing, ask questions, or request an office visit to discuss these results. You may reply directly to this message or call the office at [phone] to provide information for the provider or set up an appointment. In some instances, you will be called with test results and recommendations. Please let us know if this is preferred and we will make note of this in your chart to provide this for you.    If you have not heard a response to your lab or test results in 5 business days from all results returning to MyChart, please call the office to let us know. We ask that you please avoid calling prior to this time unless there is an emergent concern. Due to high call volumes, this can delay the resulting process.  After Hours: For all non-emergency  after hours needs, please call the office at [phone] and select the option to reach the on-call provider service. On-call services are shared between multiple [place] offices and therefore it will not be possible to speak directly with your provider. On-call providers may provide medical advice and recommendations, but are unable to provide refills for maintenance medications.  For all emergency or urgent medical needs after normal business hours, we recommend that you seek care at the closest Urgent Care or Emergency Department to ensure appropriate treatment in a timely manner.  [facility] [place] has a 24 hour emergency room located on the ground floor for your convenience.   Urgent Concerns During the Business Day Providers are seeing patients from 8AM to 5PM with a busy schedule and are most often not able to respond to non-urgent calls until the end of the day  or the next business day. If you should have URGENT concerns during the day, please call and speak to the nurse or schedule a same day appointment so that we can address your concern without delay.   Thank you, again, for choosing me as your health care partner. I appreciate your trust and look forward to learning more about you.   [name], DNP, AGNP-c ___________________________________________________________  Health Maintenance Recommendations Screening Testing Mammogram Every 1 -2 years based on history and risk factors Starting at age 11 Pap Smear Ages 21-39 every 3 years Ages 51-65 every 5 years with HPV testing More frequent testing may be required based on results and history Colon Cancer Screening Every 1-10 years based on test performed, risk factors, and history Starting at age 30 Bone Density Screening Every 2-10 years based on history Starting at age 50 for women Recommendations for men differ based on medication usage, history, and risk factors AAA Screening One time  ultrasound Men 32-37 years old who have every smoked Lung Cancer Screening Low Dose Lung CT every 12 months Age 58-80 years with a 30 pack-year smoking history who still smoke or who have quit within the last 15 years  Screening Labs Routine  Labs: Complete Blood Count (CBC), Complete Metabolic Panel (CMP), Cholesterol (Lipid Panel) Every 6-12 months based on history and medications May be recommended more frequently based on current conditions or previous results Hemoglobin A1c Lab Every 3-12 months based on history and previous results Starting at age 39 or earlier with diagnosis of diabetes, high cholesterol, BMI >26, and/or risk factors Frequent monitoring for patients with diabetes to ensure blood sugar control Thyroid Panel (TSH w/ T3 & T4) Every 6 months based on history, symptoms, and risk factors May be repeated more often if on medication HIV One time testing for all patients 41 and older May be repeated more frequently for patients with increased risk factors or exposure Hepatitis C One time testing for all patients 48 and older May be repeated more frequently for patients with increased risk factors or exposure Gonorrhea, Chlamydia Every 12 months for all sexually active persons 13-24 years Additional monitoring may be recommended for those who are considered high risk or who have symptoms PSA Men 38-2 years old with risk factors Additional screening may be recommended from age 22-69 based on risk factors, symptoms, and history  Vaccine Recommendations Tetanus Booster All adults every 10 years Flu Vaccine All patients 6 months and older every year COVID Vaccine All patients 12 years and older Initial dosing with booster May recommend additional booster based on age and health history HPV Vaccine 2 doses all patients age 58-26 Dosing may be considered for patients over 26 Shingles Vaccine (Shingrix) 2 doses all adults 55 years and older Pneumonia (Pneumovax  23) All adults 65 years and older May recommend earlier dosing based on health history Pneumonia (Prevnar 55) All adults 65 years and older Dosed 1 year after Pneumovax 23  Additional Screening, Testing, and Vaccinations may be recommended on an individualized basis based on family history, health history, risk factors, and/or exposure.  __________________________________________________________  Diet Recommendations for All Patients  I recommend that all patients maintain a diet low in saturated fats, carbohydrates, and cholesterol. While this can be challenging at first, it is not impossible and small changes can make big differences.  Things to try: Decreasing the amount of soda, sweet tea, and/or juice to one or less per day and replace with water While water is always the  first choice, if you do not like water you may consider adding a water additive without sugar to improve the taste other sugar free drinks Replace potatoes with a brightly colored vegetable at dinner Use healthy oils, such as canola oil or olive oil, instead of butter or hard margarine Limit your bread intake to two pieces or less a day Replace regular pasta with low carb pasta options Bake, broil, or grill foods instead of frying Monitor portion sizes  Eat smaller, more frequent meals throughout the day instead of large meals  An important thing to remember is, if you love foods that are not great for your health, you don't have to give them up completely. Instead, allow these foods to be a reward when you have done well. Allowing yourself to still have special treats every once in a while is a nice way to tell yourself thank you for working hard to keep yourself healthy.   Also remember that every day is a new day. If you have a bad day and "fall off the wagon", you can still climb right back up and keep moving along on your journey!  We have resources available to help you!  Some websites that may be helpful  include: www.http://www.wall-moore.info/  Www.VeryWellFit.com _____________________________________________________________  Activity Recommendations for All Patients  I recommend that all adults get at least 20 minutes of moderate physical activity that elevates your heart rate at least 5 days out of the week.  Some examples include: Walking or jogging at a pace that allows you to carry on a conversation Cycling (stationary bike or outdoors) Water aerobics Yoga Weight lifting Dancing If physical limitations prevent you from putting stress on your joints, exercise in a pool or seated in a chair are excellent options.  Do determine your MAXIMUM heart rate for activity: YOUR AGE - 220 = MAX HeartRate   Remember! Do not push yourself too hard.  Start slowly and build up your pace, speed, weight, time in exercise, etc.  Allow your body to rest between exercise and get good sleep. You will need more water than normal when you are exerting yourself. Do not wait until you are thirsty to drink. Drink with a purpose of getting in at least 8, 8 ounce glasses of water a day plus more depending on how much you exercise and sweat.    If you begin to develop dizziness, chest pain, abdominal pain, jaw pain, shortness of breath, headache, vision changes, lightheadedness, or other concerning symptoms, stop the activity and allow your body to rest. If your symptoms are severe, seek emergency evaluation immediately. If your symptoms are concerning, but not severe, please let us know so that we can recommend further evaluation.

## 2021-05-30 NOTE — Progress Notes (Signed)
Tollie Eth, DNP, AGNP-c Primary Care [facility] Sports Medicine [address]  [address] [place], [place] [address] [phone]  New patient visit   Patient: Melissa Massey   DOB: 01-30-1975   46 y.o. Female  MRN: [id number] Visit Date: 05/30/2021  Patient Care Team: Tollie Eth, NP as PCP - General (Nurse Practitioner)  Today's Vitals   05/30/21 1113  BP: 128/82  Pulse: 86  SpO2: 97%  Weight: 299 lb 14.4 oz (136 kg)  Height: 5\' 6"  (1.676 m)   Body mass index is 48.41 kg/m.   Today's healthcare provider: Tollie Eth, NP   No chief complaint on file.  Subjective    Melissa Massey is a 46 y.o. female who presents today as a new patient to establish care.    Patient endorses the following concerns presently: Insurance changed in January and none of previous providers were in network. Had to change over  New RA dx end of 2022 Appt with Dr. Dierdre Forth on 11/30 Taking 200mg  celebrex BID Has been on prednisone  Tried sulfazamine, but had severe headaches from this and had to stop  PCOS Had tubes tied Metformin helps Low carb diet controls very well Still having periods- fairly regular until 3-4 months ago  Graves Disease and Hashimotos Levothyroxine every morning Cytomel 5mg  BID Has been well controlled historically Due for TSH Can tell by the way she feels when her thyroid is off She usually feels best when TSH is between 1-2 Hx of radiation to thyroid Still has many of the graves symptoms Can flip between hypo and hyper in the past, but stable most recently Chronic fatigue  DM Glipizide, Metformin, Trulicity Does not like Trulicity and wants to try something else Experiencing cramping and diarrhea with Trulicity  GI  NASH Monitoring diet No abdominal pain, nausea, vomiting  Diverticulosis No complications in the past year Will have an attack about once a year  HTN Losartan and HCTZ Has been well controlled    ADHD Historically has been on adderall  Switched to concerta when the shortage was so bad Would like to switch back to the adderall as she does not have as good of focus on the concerta  Needs colonoscopy  CHF Needs stress test and ECHO No concerning symptoms at this time   Lumbar fusion 2003 Recently started having left sided sciatic pain down the leg Was on topamax for a long time, but has stopped Hasn't been seen for her back for many years  Pulmonology Asthma- on albuterol PRN Yearly CT scan for pulmonary nodules due to RA  Anxiety and Depression On effexor Feels like it works well, but not as well as she thinks it could Mom passed away on 03/30/2024and feels like this is worse at this time.  Anxiety is the hardest part Has tried wellbutrin, prozac, citalopram   Vitamin D deficiency Taking 50,000 units a week for years Not getting better  Vitamin B12 deficiency Has not been on oral treatment recently Would like to see if she can get injections.   History reviewed and reveals the following: Past Medical History:  Diagnosis Date   Arthritis    CHF (congestive heart failure) (HCC)    Diabetes mellitus without complication (HCC)    DVT (deep venous thrombosis) (HCC)    Graves disease    PCOS (polycystic ovarian syndrome)    Past Surgical History:  Procedure Laterality Date   CESAREAN SECTION  x2   LUMBAR FUSION     TUBAL LIGATION     Family Status  Relation Name Status   Mother  Deceased   Father  Alive   Family History  Problem Relation Age of Onset   Stroke Mother    Diabetes Mother    Hypertension Father    Social History   Socioeconomic History   Marital status: Single    Spouse name: Not on file   Number of children: Not on file   Years of education: Not on file   Highest education level: Not on file  Occupational History   Not on file  Tobacco Use   Smoking status: Every Day    Packs/day: 1.00    Types: Cigarettes   Smokeless  tobacco: Never  Vaping Use   Vaping Use: Every day   Substances: Nicotine  Substance and Sexual Activity   Alcohol use: No   Drug use: No   Sexual activity: Yes    Birth control/protection: Surgical  Other Topics Concern   Not on file  Social History Narrative   Not on file   Social Determinants of Health   Financial Resource Strain: Not on file  Food Insecurity: Not on file  Transportation Needs: Not on file  Physical Activity: Not on file  Stress: Not on file  Social Connections: Not on file   Outpatient Medications Prior to Visit  Medication Sig   albuterol (PROVENTIL HFA;VENTOLIN HFA) 108 (90 BASE) MCG/ACT inhaler Inhale 2 puffs into the lungs every 6 (six) hours as needed for wheezing or shortness of breath.    BREO ELLIPTA 200-25 MCG/ACT AEPB Inhale 1 puff into the lungs daily.   celecoxib (CELEBREX) 200 MG capsule Take 200 mg by mouth 2 (two) times daily.   cyclobenzaprine (FLEXERIL) 5 MG tablet Take 5 mg by mouth at bedtime as needed for muscle spasms.   esomeprazole (NEXIUM) 40 MG capsule Take 40 mg by mouth 2 (two) times daily.   glipiZIDE (GLUCOTROL) 5 MG tablet Take 5 mg by mouth 2 (two) times daily before a meal.   hydrochlorothiazide (HYDRODIURIL) 25 MG tablet Take 25 mg by mouth daily.   levothyroxine (SYNTHROID) 125 MCG tablet Take 250 mcg by mouth daily.   liothyronine (CYTOMEL) 5 MCG tablet Take 5 mcg by mouth 2 (two) times daily.   losartan (COZAAR) 100 MG tablet Take 100 mg by mouth daily.   metFORMIN (GLUCOPHAGE-XR) 500 MG 24 hr tablet Take 1,000 mg by mouth in the morning and at bedtime.   montelukast (SINGULAIR) 10 MG tablet Take 10 mg by mouth daily.   venlafaxine XR (EFFEXOR-XR) 150 MG 24 hr capsule Take 150 mg by mouth daily with breakfast.   Vitamin D, Ergocalciferol, (DRISDOL) 1.25 MG (50000 UNIT) CAPS capsule Take 50,000 Units by mouth every Sunday.   [DISCONTINUED] CONCERTA 36 MG CR tablet Take 36 mg by mouth every morning.   [DISCONTINUED]  predniSONE (DELTASONE) 10 MG tablet Take 4 pills a day for 3 days, then 3 pills a day for 3 days, then 2 pills a day for 1 week   [DISCONTINUED] TRULICITY 1.5 MG/0.5ML SOPN Inject 1.5 mg into the skin every Sunday.   No facility-administered medications prior to visit.   No Known Allergies Immunization History  Administered Date(s) Administered   Influenza Split 11/22/2013   Influenza,inj,quad, With Preservative 11/19/2017   PFIZER(Purple Top)SARS-COV-2 Vaccination 05/14/2019, 06/09/2019   PPD Test 06/01/2016   Tdap 06/27/2011    Review of Systems All review  of systems negative except what is listed in the HPI   Objective    BP 128/82   Pulse 86   Ht 5\' 6"  (1.676 m)   Wt 299 lb 14.4 oz (136 kg)   LMP 05/05/2021   SpO2 97%   BMI 48.41 kg/m  Physical Exam Vitals and nursing note reviewed.  Constitutional:      General: She is not in acute distress.    Appearance: Normal appearance. She is obese.  Eyes:     Extraocular Movements: Extraocular movements intact.     Conjunctiva/sclera: Conjunctivae normal.     Pupils: Pupils are equal, round, and reactive to light.  Neck:     Vascular: No carotid bruit.  Cardiovascular:     Rate and Rhythm: Normal rate and regular rhythm.     Pulses: Normal pulses.     Heart sounds: Normal heart sounds. No murmur heard. Pulmonary:     Effort: Pulmonary effort is normal.     Breath sounds: Normal breath sounds. No wheezing.  Abdominal:     General: Bowel sounds are normal. There is no distension.     Palpations: Abdomen is soft.     Tenderness: There is no abdominal tenderness. There is no guarding.  Musculoskeletal:        General: Normal range of motion.     Cervical back: Normal range of motion.     Right lower leg: No edema.     Left lower leg: No edema.  Skin:    General: Skin is warm and dry.     Capillary Refill: Capillary refill takes less than 2 seconds.  Neurological:     General: No focal deficit present.     Mental  Status: She is alert and oriented to person, place, and time.  Psychiatric:        Mood and Affect: Mood normal.        Behavior: Behavior normal.        Thought Content: Thought content normal.        Judgment: Judgment normal.    No results found for any visits on 05/30/21.  Assessment & Plan      Problem List Items Addressed This Visit     Anxiety    Chronic. Effexor 150mg  daily. Not well controlled. Discussed adding buspirone to daily regimen to see if we can get improved control. She is in agreement to that today. Will plan 7.5mg  BID scheduled with option to add 7.5mg  PRN during the day for acute symptoms. Will increase dose to maximum effective level as needed. Plan to f/u in next 2-3 weeks with VV to discuss mood and labs.        Asthma in adult, moderate persistent, with status asthmaticus    Chronic. Well controlled. No acute exacerbation or alarm sx present.  Refills today.       Attention deficit hyperactivity disorder (ADHD), predominantly inattentive type    Chronic. Not well controlled on concerta. Will change back to adderall 40mg  XR daily. Monitor closely with high dose. F/U in 3 months or sooner if medication not effective.        Essential hypertension    Chronic. Well controlled today. Labs today. No refills needed.  Continue current medications. Will make changes to POC based on labs as needed.  Goal BP <130/80 in setting of chronic conditions.        Fatty liver    Chronic. I need of GI referral due to insurance change and  provider no longer in network. Managing diet. Encouraged low fat diet and increased activity with tight glucose, lipids, and weight control.  Referral sent.        Gastroesophageal reflux disease without esophagitis   Graves disease    Chronic. Currently managed with cytomel and levothyroxine. No alarm sx present. No recent labs. Will obtain labs today and make changes to POC based on findings as necessary        Hepatosplenomegaly    Chronic. No alarm sx. Referral to GI       Hypersomnia    Etiology unclear at this time. In setting of depression, anxiety, chronic disease, vitamin d deficiency, vitamin b12 deficiency, RA, DM, OSA, and hypothyroidism this is likely multifactoral. Will obtain labs today.  On stimulant for ADHD.  Will review chart for OSA hx and recommendations to ensure that we are doing all we can to control.        Obstructive sleep apnea syndrome, severe    Chronic. Will review chart to determine recommendations from previous provider.  Endorses daytime fatigue- if not on CPAP, may benefit if indicated.  Will make changes to POC based on chart review and recommendations.        Polycystic ovarian syndrome    Chronic. Controlled with metformin. Having periods. No alarm sx today.        Postoperative hypothyroidism    Chronic. Ablation 2014. Will obtain labs today.        Rheumatoid arthritis involving multiple sites with positive rheumatoid factor (HCC)    New dx 2022. Needs new rheumatologist due to insurance change and previous provider no longer in network. Currently on prednisone 20mg  daily. Wanting to taper.  Recommend 10mg  daily x 2 weeks then 5 mg daily for at least 2 weeks until seen by Rhuem.  Appt with Dr. Dierdre Forth on 05/30. Not well controlled- pain is significantly impacting mental health and life. Hopeful that medication management with Rheum will help with this.        Severe obesity (BMI >= 40) (HCC)    BMI 48.41 today.  Start mounjaro.  Stop trulicity.  Continue low carb, low fat diet. Monitor portions closely Exercise recommended at 150 minutes per week.         Type 2 diabetes mellitus with hyperglycemia, without long-term current use of insulin (HCC)    Chronic. A1c 7.7% today. Chronic steroid use.  SE from Trulicity. Will switch to [facility] today to see if we can get improved control and symptom reduction. Need records for eye exam. Needs  foot exam. Needs urine micro. Will monitor labs today.  Recommend low carb diet (180 g or less daily), low fat diet (13g or less saturated fat daily), increased exercise, tight BP control.        Vitamin D deficiency    Chronic with decreased absorption. Chronically on replacement. Will monitor labs today.        B12 deficiency    Chronic. No replacement at this time. Will monitor labs today.        Chronic congestive heart failure (HCC)    Chronic. No alarm sx present at this time. Labs today.  Per patient due for repeat ECHO and stress test- unable to complete last referral due to insurance change and need for new providers.  Referral placed today.  Encouraged heart healthy diet and physical activity. Tight BP, lipid, and glucose control.        Depression, recurrent (HCC)    Chronic. Effexor  150mg  a day. Not well controlled at this time.  Recent loss of mother causing exacerbation.  Will add buspar to regimen today.  Consider effexor dose increase to see if this is helpful as current dose is helping.  Avoid changing medication unless maximum dosage and adjunctive therapy are not effective to reduce risk of medication effectiveness in the future.        Other Visit Diagnoses     Encounter for medical examination to establish care    -  Primary        Return for TBD based on labs.    Time: 86 minutes, >50% spent counseling, care coordination, chart review, and documentation.    Denham Mose, Sung Amabile, NP, DNP, AGNP-C Primary [facility] Medicine at [facility]

## 2021-05-30 NOTE — Assessment & Plan Note (Addendum)
>>  ASSESSMENT AND PLAN FOR OBSTRUCTIVE SLEEP APNEA SYNDROME, SEVERE WRITTEN ON 05/30/2021 12:39 PM BY Fey Coghill E, NP  Chronic. Will review chart to determine recommendations from previous provider.  Endorses daytime fatigue- if not on CPAP, may benefit if indicated.  Will make changes to POC based on chart review and recommendations.    >>ASSESSMENT AND PLAN FOR HYPERSOMNIA WRITTEN ON 05/30/2021 12:49 PM BY Rajohn Henery E, NP  Etiology unclear at this time. In setting of depression, anxiety, chronic disease, vitamin d  deficiency, vitamin b12 deficiency, RA, DM, OSA, and hypothyroidism this is likely multifactoral. Will obtain labs today.  On stimulant for ADHD.  Will review chart for OSA hx and recommendations to ensure that we are doing all we can to control.

## 2021-05-30 NOTE — Assessment & Plan Note (Signed)
New dx 2022. Needs new rheumatologist due to insurance change and previous provider no longer in network. Currently on prednisone 20mg  daily. Wanting to taper.  ?Recommend 10mg  daily x 2 weeks then 5 mg daily for at least 2 weeks until seen by Rhuem.  ?Appt with Melissa Massey on 05/30. ?Not well controlled- pain is significantly impacting mental health and life. Hopeful that medication management with Rheum will help with this.  ?

## 2021-05-30 NOTE — Assessment & Plan Note (Signed)
Etiology unclear at this time. In setting of depression, anxiety, chronic disease, vitamin d deficiency, vitamin b12 deficiency, RA, DM, OSA, and hypothyroidism this is likely multifactoral. Will obtain labs today.  ?On stimulant for ADHD.  ?Will review chart for OSA hx and recommendations to ensure that we are doing all we can to control.  ?

## 2021-05-30 NOTE — Assessment & Plan Note (Signed)
Chronic. Ablation 2014. Will obtain labs today.  ?

## 2021-05-30 NOTE — Assessment & Plan Note (Signed)
Chronic. No alarm sx. Referral to GI ?

## 2021-05-30 NOTE — Assessment & Plan Note (Signed)
Chronic. A1c 7.7% today. Chronic steroid use.  ?SE from Trulicity. Will switch to [facility] today to see if we can get improved control and symptom reduction. ?Need records for eye exam. Needs foot exam. Needs urine micro. Will monitor labs today.  ?Recommend low carb diet (180 g or less daily), low fat diet (13g or less saturated fat daily), increased exercise, tight BP control.  ?

## 2021-05-30 NOTE — Assessment & Plan Note (Signed)
Chronic. Effexor 150mg  daily. Not well controlled. Discussed adding buspirone to daily regimen to see if we can get improved control. She is in agreement to that today. Will plan 7.5mg  BID scheduled with option to add 7.5mg  PRN during the day for acute symptoms. Will increase dose to maximum effective level as needed. Plan to f/u in next 2-3 weeks with VV to discuss mood and labs.  ?

## 2021-05-30 NOTE — Assessment & Plan Note (Signed)
Chronic. No replacement at this time. Will monitor labs today.  

## 2021-05-30 NOTE — Assessment & Plan Note (Signed)
Chronic. Controlled with metformin. Having periods. No alarm sx today.  ?

## 2021-05-30 NOTE — Assessment & Plan Note (Signed)
Chronic. I need of GI referral due to insurance change and provider no longer in network. Managing diet. Encouraged low fat diet and increased activity with tight glucose, lipids, and weight control.  ?Referral sent.  ?

## 2021-05-30 NOTE — Assessment & Plan Note (Signed)
Chronic. Not well controlled on concerta. Will change back to adderall 40mg  XR daily. Monitor closely with high dose. ?F/U in 3 months or sooner if medication not effective.  ?

## 2021-05-30 NOTE — Assessment & Plan Note (Signed)
Chronic. Well controlled. No acute exacerbation or alarm sx present.  ?Refills today. ?

## 2021-05-30 NOTE — Assessment & Plan Note (Signed)
Chronic. No alarm sx present at this time. Labs today.  ?Per patient due for repeat ECHO and stress test- unable to complete last referral due to insurance change and need for new providers.  ?Referral placed today.  ?Encouraged heart healthy diet and physical activity. Tight BP, lipid, and glucose control.  ?

## 2021-05-30 NOTE — Assessment & Plan Note (Signed)
Chronic. Well controlled today. Labs today. ?No refills needed.  ?Continue current medications. Will make changes to POC based on labs as needed.  ?Goal BP <130/80 in setting of chronic conditions.  ?

## 2021-05-30 NOTE — Assessment & Plan Note (Signed)
Chronic with decreased absorption. Chronically on replacement. Will monitor labs today.  ?

## 2021-05-30 NOTE — Assessment & Plan Note (Signed)
BMI 48.41 today.  ?Start mounjaro.  ?Stop trulicity.  ?Continue low carb, low fat diet. Monitor portions closely ?Exercise recommended at 150 minutes per week.  ? ?

## 2021-05-30 NOTE — Assessment & Plan Note (Signed)
Chronic. Currently managed with cytomel and levothyroxine. No alarm sx present. ?No recent labs. ?Will obtain labs today and make changes to POC based on findings as necessary ?

## 2021-05-30 NOTE — Assessment & Plan Note (Signed)
Chronic. Effexor 150mg  a day. Not well controlled at this time.  ?Recent loss of mother causing exacerbation.  ?Will add buspar to regimen today.  ?Consider effexor dose increase to see if this is helpful as current dose is helping.  ?Avoid changing medication unless maximum dosage and adjunctive therapy are not effective to reduce risk of medication effectiveness in the future.  ?

## 2021-06-07 ENCOUNTER — Telehealth (HOSPITAL_BASED_OUTPATIENT_CLINIC_OR_DEPARTMENT_OTHER): Payer: Self-pay | Admitting: Nurse Practitioner

## 2021-06-07 NOTE — Telephone Encounter (Signed)
I will call and appeal decision as records weren't sent with prior auth in cover my meds.  ?

## 2021-06-07 NOTE — Telephone Encounter (Signed)
Please document update of appeal or new prescription for patient. ?

## 2021-06-07 NOTE — Telephone Encounter (Signed)
Received fax transmission from pt's ins company denying coverage for Mounjaro 2.5 MG pen. Documents will be in provider's yellow dot tray. ?

## 2021-06-08 LAB — VITAMIN D 1,25 DIHYDROXY
Vitamin D 1, 25 (OH)2 Total: 84 pg/mL — ABNORMAL HIGH
Vitamin D2 1, 25 (OH)2: 66 pg/mL
Vitamin D3 1, 25 (OH)2: 18 pg/mL

## 2021-06-08 LAB — CBC WITH DIFFERENTIAL/PLATELET
Basophils Absolute: 0 10*3/uL (ref 0.0–0.2)
Basos: 0 %
EOS (ABSOLUTE): 0.1 10*3/uL (ref 0.0–0.4)
Eos: 1 %
Hematocrit: 31.8 % — ABNORMAL LOW (ref 34.0–46.6)
Hemoglobin: 9.8 g/dL — ABNORMAL LOW (ref 11.1–15.9)
Immature Grans (Abs): 0.1 10*3/uL (ref 0.0–0.1)
Immature Granulocytes: 1 %
Lymphocytes Absolute: 1 10*3/uL (ref 0.7–3.1)
Lymphs: 10 %
MCH: 24.7 pg — ABNORMAL LOW (ref 26.6–33.0)
MCHC: 30.8 g/dL — ABNORMAL LOW (ref 31.5–35.7)
MCV: 80 fL (ref 79–97)
Monocytes Absolute: 0.5 10*3/uL (ref 0.1–0.9)
Monocytes: 5 %
Neutrophils Absolute: 8.4 10*3/uL — ABNORMAL HIGH (ref 1.4–7.0)
Neutrophils: 83 %
Platelets: 389 10*3/uL (ref 150–450)
RBC: 3.97 x10E6/uL (ref 3.77–5.28)
RDW: 16.9 % — ABNORMAL HIGH (ref 11.7–15.4)
WBC: 10.1 10*3/uL (ref 3.4–10.8)

## 2021-06-08 LAB — COMPREHENSIVE METABOLIC PANEL
ALT: 35 IU/L — ABNORMAL HIGH (ref 0–32)
AST: 21 IU/L (ref 0–40)
Albumin/Globulin Ratio: 1.9 (ref 1.2–2.2)
Albumin: 4.1 g/dL (ref 3.8–4.8)
Alkaline Phosphatase: 91 IU/L (ref 44–121)
BUN/Creatinine Ratio: 15 (ref 9–23)
BUN: 10 mg/dL (ref 6–24)
Bilirubin Total: 0.2 mg/dL (ref 0.0–1.2)
CO2: 20 mmol/L (ref 20–29)
Calcium: 9 mg/dL (ref 8.7–10.2)
Chloride: 99 mmol/L (ref 96–106)
Creatinine, Ser: 0.67 mg/dL (ref 0.57–1.00)
Globulin, Total: 2.2 g/dL (ref 1.5–4.5)
Glucose: 301 mg/dL — ABNORMAL HIGH (ref 70–99)
Potassium: 4.2 mmol/L (ref 3.5–5.2)
Sodium: 138 mmol/L (ref 134–144)
Total Protein: 6.3 g/dL (ref 6.0–8.5)
eGFR: 108 mL/min/{1.73_m2} (ref >59)

## 2021-06-08 LAB — LIPID PANEL
Chol/HDL Ratio: 3.1 ratio (ref 0.0–4.4)
Cholesterol, Total: 213 mg/dL — ABNORMAL HIGH (ref 100–199)
HDL: 69 mg/dL (ref >39)
LDL Chol Calc (NIH): 121 mg/dL — ABNORMAL HIGH (ref 0–99)
Triglycerides: 133 mg/dL (ref 0–149)
VLDL Cholesterol Cal: 23 mg/dL (ref 5–40)

## 2021-06-08 LAB — TSH: TSH: 5.88 u[IU]/mL — ABNORMAL HIGH (ref 0.450–4.500)

## 2021-06-08 LAB — B12 AND FOLATE PANEL
Folate: 6.6 ng/mL (ref >3.0)
Vitamin B-12: 153 pg/mL — ABNORMAL LOW (ref 232–1245)

## 2021-06-08 LAB — T4, FREE: Free T4: 1.15 ng/dL (ref 0.82–1.77)

## 2021-06-09 ENCOUNTER — Encounter (HOSPITAL_BASED_OUTPATIENT_CLINIC_OR_DEPARTMENT_OTHER): Payer: Self-pay | Admitting: Nurse Practitioner

## 2021-06-09 ENCOUNTER — Ambulatory Visit (INDEPENDENT_AMBULATORY_CARE_PROVIDER_SITE_OTHER): Payer: 59 | Admitting: Nurse Practitioner

## 2021-06-09 ENCOUNTER — Ambulatory Visit (HOSPITAL_BASED_OUTPATIENT_CLINIC_OR_DEPARTMENT_OTHER)
Admission: RE | Admit: 2021-06-09 | Discharge: 2021-06-09 | Disposition: A | Discharge status: Home / Self Care | Payer: 59 | Source: Ambulatory Visit | Attending: Nurse Practitioner | Admitting: Nurse Practitioner

## 2021-06-09 VITALS — BP 130/100 | HR 86 | Temp 99.5°F | Ht 66.0 in | Wt 293.0 lb

## 2021-06-09 DIAGNOSIS — K5792 Diverticulitis of intestine, part unspecified, without perforation or abscess without bleeding: Secondary | ICD-10-CM

## 2021-06-09 DIAGNOSIS — K5732 Diverticulitis of large intestine without perforation or abscess without bleeding: Secondary | ICD-10-CM | POA: Diagnosis not present

## 2021-06-09 HISTORY — DX: Diverticulitis of intestine, part unspecified, without perforation or abscess without bleeding: K57.92

## 2021-06-09 MED ORDER — IOHEXOL 300 MG/ML  SOLN
100.0000 mL | Freq: Once | INTRAMUSCULAR | Status: AC | PRN
  Administered 2021-06-09: 100 mL via INTRAVENOUS

## 2021-06-09 MED ORDER — HYDROCODONE-ACETAMINOPHEN 5-325 MG PO TABS: 1.0000 | ORAL_TABLET | Freq: Four times a day (QID) | ORAL | 0 refills | Status: DC | PRN

## 2021-06-09 MED ORDER — PROMETHAZINE HCL 25 MG PO TABS: 25.0000 mg | ORAL_TABLET | Freq: Three times a day (TID) | ORAL | 0 refills | Status: DC | PRN

## 2021-06-09 MED ORDER — ONDANSETRON HCL 8 MG PO TABS
8.0000 mg | ORAL_TABLET | Freq: Three times a day (TID) | ORAL | 2 refills | Status: DC | PRN
Start: 2021-06-09 — End: 2023-07-01

## 2021-06-09 MED ORDER — CIPROFLOXACIN HCL 500 MG PO TABS: 500.0000 mg | ORAL_TABLET | Freq: Two times a day (BID) | ORAL | 0 refills | Status: AC

## 2021-06-09 NOTE — Patient Instructions (Signed)
Clear liquids or easy to digest foods for the next few days.   If the CT doesn't show any evidence of leaking or severe concerns I will send in the antibiotic for you and pain medication. I will also send diflucan and nausea medication.   If the CT shows any signs of concern for leaking then we will call you and have you go to the emergency room.

## 2021-06-09 NOTE — Progress Notes (Signed)
  Orma Render, DNP, AGNP-c Primary Care & Sports Medicine [address]  [place], [place] [address] [phone]  Subjective:   Melissa Massey is a 46 y.o. female presents to day for abdominal pain with suspected flair of diverticulitis.  Diverticulitis Flair Nausea, vomiting, diarrhea, abdominal pain for 3 days Worsening over the last 24 hours History of diverticulitis with similar symptoms Pain in entire LLQ moving around the L flank Pain 7/10 Unable to get comfortable Has been able to hold down liquids today No blood in stool No blood in emesis  PMH, Medications, and Allergies reviewed and updated in chart.   ROS negative except for what is listed in HPI. Objective:  BP (!) 130/100   Pulse 86   Temp 99.5 F (37.5 C)   Ht 5\' 6"  (1.676 m)   Wt 293 lb (132.9 kg)   SpO2 98%   BMI 47.29 kg/m  Physical Exam Vitals and nursing note reviewed.  Constitutional:      Appearance: She is ill-appearing.  HENT:     Head: Normocephalic.  Cardiovascular:     Rate and Rhythm: Regular rhythm. Tachycardia present.     Pulses: Normal pulses.     Heart sounds: Normal heart sounds.  Pulmonary:     Effort: Pulmonary effort is normal.     Breath sounds: Normal breath sounds.  Abdominal:     General: There is no distension.     Palpations: Abdomen is soft. There is no mass.     Tenderness: There is abdominal tenderness. There is guarding. There is no right CVA tenderness, left CVA tenderness or rebound.  Skin:    General: Skin is warm and dry.  Neurological:     General: No focal deficit present.     Mental Status: She is alert and oriented to person, place, and time.     Motor: Weakness present.  Psychiatric:        Mood and Affect: Mood normal.        Behavior: Behavior normal.        Thought Content: Thought content normal.        Judgment: Judgment normal.          Assessment & Plan:   Problem List Items Addressed This Visit      Diverticulitis - Primary    Intensifying abdominal pain in LLQ x3 days with N/V/D. Patient mildly febrile today. Abd CT shows Diverticulitis of the descending and sigmoid colon without abscess formation. There is no evidence of bowel obstruction.  Given patients co-morbidities and presence of fever, will begin treatment today with oral ciprofloxacin and antiemetics. Pain medication provided for short term relief.  Patient provided with instructions on diet and activity. Strict return precautions provided. She is aware if her symptoms worsen or fail to improve over the weekend she is to go to the ED for further evaluation.         Relevant Medications   ciprofloxacin (CIPRO) 500 MG tablet   promethazine (PHENERGAN) 25 MG tablet   ondansetron (ZOFRAN) 8 MG tablet   HYDROcodone-acetaminophen (NORCO/VICODIN) 5-325 MG tablet   Other Relevant Orders   CT Abdomen Pelvis W Contrast (Completed)       History and Medication reviewed and updated this encounter.   Orma Render, DNP, AGNP-c 06/09/2021  2:52 PM

## 2021-06-09 NOTE — Assessment & Plan Note (Addendum)
Intensifying abdominal pain in LLQ x3 days with N/V/D. Patient mildly febrile today. Abd CT shows Diverticulitis of the descending and sigmoid colon without abscess formation. There is no evidence of bowel obstruction.  Given patients co-morbidities and presence of fever, will begin treatment today with oral ciprofloxacin and antiemetics. Pain medication provided for short term relief.  Patient provided with instructions on diet and activity. Strict return precautions provided. She is aware if her symptoms worsen or fail to improve over the weekend she is to go to the ED for further evaluation.

## 2021-06-13 ENCOUNTER — Ambulatory Visit (INDEPENDENT_AMBULATORY_CARE_PROVIDER_SITE_OTHER): Payer: 59 | Admitting: Nurse Practitioner

## 2021-06-13 ENCOUNTER — Encounter (HOSPITAL_BASED_OUTPATIENT_CLINIC_OR_DEPARTMENT_OTHER): Payer: Self-pay | Admitting: Nurse Practitioner

## 2021-06-13 VITALS — BP 128/2 | HR 82 | Ht 66.0 in | Wt 279.0 lb

## 2021-06-13 DIAGNOSIS — B379 Candidiasis, unspecified: Secondary | ICD-10-CM

## 2021-06-13 DIAGNOSIS — K5792 Diverticulitis of intestine, part unspecified, without perforation or abscess without bleeding: Secondary | ICD-10-CM | POA: Diagnosis not present

## 2021-06-13 DIAGNOSIS — E611 Iron deficiency: Secondary | ICD-10-CM

## 2021-06-13 DIAGNOSIS — E538 Deficiency of other specified B group vitamins: Secondary | ICD-10-CM | POA: Diagnosis not present

## 2021-06-13 MED ORDER — IRON (FERROUS SULFATE) 325 (65 FE) MG PO TABS: 325.0000 mg | ORAL_TABLET | Freq: Every day | ORAL | 1 refills | Status: AC

## 2021-06-13 MED ORDER — CYANOCOBALAMIN 1000 MCG/ML IJ SOLN: 1000.0000 ug | INTRAMUSCULAR | Status: DC

## 2021-06-13 MED ORDER — FLUCONAZOLE 150 MG PO TABS: 150.0000 mg | ORAL_TABLET | Freq: Once | ORAL | 0 refills | Status: AC

## 2021-06-13 MED ORDER — CYANOCOBALAMIN 1000 MCG/ML IJ SOLN
1000.0000 ug | INTRAMUSCULAR | Status: DC
  Administered 2021-06-13: 1000 ug via INTRAMUSCULAR

## 2021-06-13 NOTE — Progress Notes (Signed)
Melissa Clamp, DNP, AGNP-c [facility] [address] [place], [place] [address] [phone] Office [phone] Fax  ESTABLISHED PATIENT- Chronic Health and/or Follow-Up Visit  Blood pressure (!) 128/2, pulse 82, height 5\' 6"  (1.676 m), weight 279 lb (126.6 kg), SpO2 95 %.  Follow-up (Patient is her for follow up of diverticulitis, she is feeling great. She would like to discuss labs and a plan.)   HPI  Melissa Massey  is a 46 y.o. year old female presenting today for evaluation and management of the following: Diverticulitis Flair F/U Recent flare of diverticulitis with severe abdominal pain, nausea, vomiting, diarrhea. Patient was started on antibiotic treatment.  She has completed this at this time. She tells me that after the second day of antibiotics her symptoms were nearly resolved and she was feeling much better. She reports she has not had any additional symptoms since completing the antibiotic therapy. She is back to a normal diet with normal stools.  Mounjaro Recently started on [facility] for control of diabetes and weight. She has had an 11 pound weight loss since starting this medication. She is on a low carbohydrate low-fat diet and is tolerating the medication well. She denies any side effects from the medication or concerning symptoms. Anemia She endorses perimenopausal symptoms only having a menstrual cycle every other month. She reports that the menstrual cycle she does have are very heavy with large clots.    She reports these periods are lasting 5 to 7 days on average. She has had no other signs of bleeding  ROS All ROS negative with exception of what is listed in HPI  PHYSICAL EXAM Physical Exam Vitals and nursing note reviewed.  Constitutional:      General: She is not in acute distress.    Appearance: Normal appearance.  HENT:     Head: Normocephalic.  Eyes:     Extraocular Movements: Extraocular  movements intact.     Conjunctiva/sclera: Conjunctivae normal.     Pupils: Pupils are equal, round, and reactive to light.  Neck:     Vascular: No carotid bruit.  Cardiovascular:     Rate and Rhythm: Normal rate and regular rhythm.     Pulses: Normal pulses.     Heart sounds: Normal heart sounds. No murmur heard. Pulmonary:     Effort: Pulmonary effort is normal.     Breath sounds: Normal breath sounds. No wheezing.  Abdominal:     General: Bowel sounds are normal. There is no distension.     Palpations: Abdomen is soft. There is no mass.     Tenderness: There is no abdominal tenderness. There is no right CVA tenderness, left CVA tenderness, guarding or rebound.     Hernia: No hernia is present.  Musculoskeletal:        General: Normal range of motion.     Cervical back: Normal range of motion and neck supple.     Right lower leg: No edema.     Left lower leg: No edema.  Lymphadenopathy:     Cervical: No cervical adenopathy.  Skin:    General: Skin is warm and dry.     Capillary Refill: Capillary refill takes less than 2 seconds.  Neurological:     General: No focal deficit present.     Mental Status: She is alert and oriented to person, place, and time.     Motor: No weakness.     Gait: Gait normal.  Psychiatric:  Mood and Affect: Mood normal.        Behavior: Behavior normal.        Thought Content: Thought content normal.        Judgment: Judgment normal.    ASSESSMENT & PLAN Problem List Items Addressed This Visit     Vitamin B 12 deficiency    Recent labs show B12 deficiency.  We will begin treatment today with B12 injection and plan to repeat every 30 days.  Plan to recheck in 3 months.  No alarm symptoms present at this time.  Injection provided today by CMA.  Patient tolerated well.       Relevant Medications   cyanocobalamin ((VITAMIN B-12)) injection 1,000 mcg   Diverticulitis    Complete resolution of symptoms with antibiotic treatment.  No  additional symptoms present today.  No alarm symptoms present.  Encourage patient to continue to monitor her diet closely.  Labs from previous evaluation reviewed with patient today.  We will continue to monitor.       Iron deficiency - Primary    Recent labs show iron deficiency anemia present.  Patient does endorse heavy menstrual cycles every other month with large clots and heavy bleeding.  This is likely the cause of her deficiency as she has no other signs of bleeding at this time. Recommend ferrous sulfate 325 mg every other day to boost iron levels.  We will continue to monitor this closely.  Patient will notify if there are any signs of bleeding present.       Relevant Medications   Iron, Ferrous Sulfate, 325 (65 Fe) MG TABS   Other Visit Diagnoses     Yeast infection            FOLLOW-UP No follow-ups on file.     Melissa Clamp, DNP, AGNP-c 06/13/2021  1:33 PM

## 2021-06-13 NOTE — Patient Instructions (Addendum)
We will start B12 injections today and plan to do these about every 30 days.   I am going to do some research about the Vitamin D to see what options would be good.   I want you to start the iron to get these levels up.   I will find out the dose of magnesium to help with bowel movements and sleep. :-)

## 2021-06-14 ENCOUNTER — Encounter (HOSPITAL_BASED_OUTPATIENT_CLINIC_OR_DEPARTMENT_OTHER): Payer: Self-pay | Admitting: Nurse Practitioner

## 2021-06-14 DIAGNOSIS — E611 Iron deficiency: Secondary | ICD-10-CM | POA: Insufficient documentation

## 2021-06-14 NOTE — Assessment & Plan Note (Signed)
Recent labs show iron deficiency anemia present.  Patient does endorse heavy menstrual cycles every other month with large clots and heavy bleeding.  This is likely the cause of her deficiency as she has no other signs of bleeding at this time. Recommend ferrous sulfate 325 mg every other day to boost iron levels.  We will continue to monitor this closely.  Patient will notify if there are any signs of bleeding present.

## 2021-06-14 NOTE — Assessment & Plan Note (Signed)
Recent labs show B12 deficiency.  We will begin treatment today with B12 injection and plan to repeat every 30 days.  Plan to recheck in 3 months.  No alarm symptoms present at this time.  Injection provided today by CMA.  Patient tolerated well.

## 2021-06-14 NOTE — Assessment & Plan Note (Signed)
Complete resolution of symptoms with antibiotic treatment.  No additional symptoms present today.  No alarm symptoms present.  Encourage patient to continue to monitor her diet closely.  Labs from previous evaluation reviewed with patient today.  We will continue to monitor.

## 2021-06-15 NOTE — Progress Notes (Signed)
Referring-Sara Early NP Reason for referral-CHF and hypertension  HPI: 46 year old female for evaluation of CHF and hypertension at request of Enid Skeens NP.  Echocardiogram December 2021 showed normal LV function.  High-resolution chest CT November 2022 showed nonspecific air trapping, bronchiectasis and nonspecific small nodules.  Laboratories May 2023 showed TSH 5.88, LDL 121, creatinine 0.67, hemoglobin 9.8.  Patient states that for the past 1-1/2 years she has had increasing dyspnea on exertion.  No orthopnea or PND but she has had mild pedal edema.  She has an occasional uncomfortable feeling in her substernal area that does not radiate.  This occurs predominantly at night or after vigorous activities.  It does not occur during activities.  Occasional brief flutter but no sustained palpitations.  No syncope.  Because of the above cardiology asked to evaluate.  Current Outpatient Medications  Medication Sig Dispense Refill   albuterol (PROVENTIL) (2.5 MG/3ML) 0.083% nebulizer solution Take 3 mLs (2.5 mg total) by nebulization every 4 (four) hours as needed for wheezing or shortness of breath (please include nebulizer machine, hoses, and mask if needed.). 185 mL 6   albuterol (VENTOLIN HFA) 108 (90 Base) MCG/ACT inhaler Inhale 2 puffs into the lungs every 6 (six) hours as needed for wheezing or shortness of breath. 18 g 3   amphetamine-dextroamphetamine (ADDERALL XR) 20 MG 24 hr capsule Take 2 capsules (40 mg total) by mouth daily. 60 capsule 0   BREO ELLIPTA 200-25 MCG/ACT AEPB Inhale 1 puff into the lungs daily.     busPIRone (BUSPAR) 7.5 MG tablet Take 1 tablet (7.5 mg total) by mouth 3 (three) times daily. 90 tablet 2   celecoxib (CELEBREX) 200 MG capsule Take 200 mg by mouth 2 (two) times daily.     cyclobenzaprine (FLEXERIL) 5 MG tablet Take 5 mg by mouth at bedtime as needed for muscle spasms.     esomeprazole (NEXIUM) 40 MG capsule Take 40 mg by mouth 2 (two) times daily.      etanercept (ENBREL SURECLICK) 50 MG/ML injection 50mg      glipiZIDE (GLUCOTROL) 5 MG tablet Take 5 mg by mouth 2 (two) times daily before a meal.     hydrochlorothiazide (HYDRODIURIL) 25 MG tablet Take 25 mg by mouth daily.     HYDROcodone-acetaminophen (NORCO/VICODIN) 5-325 MG tablet Take 1-2 tablets by mouth every 6 (six) hours as needed for moderate pain or severe pain. 30 tablet 0   Iron, Ferrous Sulfate, 325 (65 Fe) MG TABS Take 325 mg by mouth daily. 90 tablet 1   levothyroxine (SYNTHROID) 125 MCG tablet Take 250 mcg by mouth daily.     liothyronine (CYTOMEL) 5 MCG tablet Take 5 mcg by mouth 2 (two) times daily.     losartan (COZAAR) 100 MG tablet Take 100 mg by mouth daily.     metFORMIN (GLUCOPHAGE-XR) 500 MG 24 hr tablet Take 1,000 mg by mouth in the morning and at bedtime.     metoprolol tartrate (LOPRESSOR) 100 MG tablet Take 2 hours prior to ct scan 1 tablet 0   montelukast (SINGULAIR) 10 MG tablet Take 10 mg by mouth daily.     ondansetron (ZOFRAN) 8 MG tablet Take 1 tablet (8 mg total) by mouth every 8 (eight) hours as needed for nausea or vomiting. 20 tablet 2   predniSONE (DELTASONE) 10 MG tablet Take 1 tablet (10 mg total) by mouth daily with breakfast for 14 days, THEN 0.5 tablets (5 mg total) daily with breakfast for 14 days. 21 tablet  0   promethazine (PHENERGAN) 25 MG tablet Take 1 tablet (25 mg total) by mouth every 8 (eight) hours as needed for nausea or vomiting. 20 tablet 0   rosuvastatin (CRESTOR) 20 MG tablet Take 1 tablet (20 mg total) by mouth daily. 90 tablet 3   tirzepatide (MOUNJARO) 2.5 MG/0.5ML Pen Inject 2.5 mg into the skin once a week. 2 mL 0   venlafaxine XR (EFFEXOR-XR) 150 MG 24 hr capsule Take 150 mg by mouth daily with breakfast.     Vitamin D, Ergocalciferol, (DRISDOL) 1.25 MG (50000 UNIT) CAPS capsule Take 50,000 Units by mouth every Sunday.     Current Facility-Administered Medications  Medication Dose Route Frequency Provider Last Rate Last Admin    cyanocobalamin ((VITAMIN B-12)) injection 1,000 mcg  1,000 mcg Intramuscular Q30 days Early, Sung AmabileSara E, NP   1,000 mcg at 06/13/21 1630    No Known Allergies   Past Medical History:  Diagnosis Date   Arthritis    Asthma    CHF (congestive heart failure) (HCC)    Diabetes mellitus without complication (HCC)    DVT (deep venous thrombosis) (HCC)    Graves disease    Hyperlipidemia    Hypertension    PCOS (polycystic ovarian syndrome)    Rheumatoid arthritis (HCC)     Past Surgical History:  Procedure Laterality Date   CESAREAN SECTION     x2   LUMBAR FUSION     TUBAL LIGATION      Social History   Socioeconomic History   Marital status: Single    Spouse name: Not on file   Number of children: 4   Years of education: Not on file   Highest education level: Not on file  Occupational History   Not on file  Tobacco Use   Smoking status: Every Day    Packs/day: 1.00    Types: Cigarettes   Smokeless tobacco: Never  Vaping Use   Vaping Use: Every day   Substances: Nicotine  Substance and Sexual Activity   Alcohol use: No   Drug use: No   Sexual activity: Yes    Birth control/protection: Surgical  Other Topics Concern   Not on file  Social History Narrative   Not on file   Social Determinants of Health   Financial Resource Strain: Not on file  Food Insecurity: Not on file  Transportation Needs: Not on file  Physical Activity: Not on file  Stress: Not on file  Social Connections: Not on file  Intimate Partner Violence: Not on file    Family History  Problem Relation Age of Onset   Heart attack Mother    Stroke Mother    Diabetes Mother    Hypertension Father     ROS: no fevers or chills, productive cough, hemoptysis, dysphasia, odynophagia, melena, hematochezia, dysuria, hematuria, rash, seizure activity, orthopnea, PND, pedal edema, claudication. Remaining systems are negative.  Physical Exam:   Blood pressure 128/75, pulse 83, height 5\' 6"  (1.676 m),  weight 284 lb 0.6 oz (128.8 kg), SpO2 98 %.  General:  Well developed/well nourished in NAD Skin warm/dry Patient not depressed No peripheral clubbing Back-normal HEENT-normal/normal eyelids Neck supple/normal carotid upstroke bilaterally; no bruits; no JVD; no thyromegaly chest - CTA/ normal expansion CV - RRR/normal S1 and S2; no murmurs, rubs or gallops;  PMI nondisplaced Abdomen -NT/ND, no HSM, no mass, + bowel sounds, no bruit 2+ femoral pulses, no bruits Ext-no edema, chords, 2+ DP Neuro-grossly nonfocal   A/P  1 dyspnea-etiology  unclear.  She also has some atypical chest discomfort predominantly after exercising or at night.  I will arrange an echocardiogram to assess LV function.  I will also schedule a cardiac CTA to rule out obstructive coronary disease given her multiple risk factors.  2 congestive heart failure-question diastolic dysfunction as a cause of her dyspnea and minimal edema.  We will arrange echocardiogram to assess LV function.  3 diabetes mellitus-managed per primary care.  4 hypertension-continue present blood pressure medications.  5 hyperlipidemia-LDL is elevated.  She also has diabetes mellitus.  We will begin Crestor 20 mg daily.  Check lipids and liver in 8 weeks.  Olga Millers, MD

## 2021-06-20 DIAGNOSIS — Z6841 Body Mass Index (BMI) 40.0 and over, adult: Secondary | ICD-10-CM | POA: Diagnosis not present

## 2021-06-20 DIAGNOSIS — M0579 Rheumatoid arthritis with rheumatoid factor of multiple sites without organ or systems involvement: Secondary | ICD-10-CM | POA: Diagnosis not present

## 2021-06-20 DIAGNOSIS — H04123 Dry eye syndrome of bilateral lacrimal glands: Secondary | ICD-10-CM | POA: Diagnosis not present

## 2021-06-20 DIAGNOSIS — M791 Myalgia, unspecified site: Secondary | ICD-10-CM | POA: Diagnosis not present

## 2021-06-21 ENCOUNTER — Ambulatory Visit: Payer: 59 | Admitting: Cardiology

## 2021-06-21 ENCOUNTER — Encounter: Payer: Self-pay | Admitting: *Deleted

## 2021-06-21 ENCOUNTER — Encounter: Payer: Self-pay | Admitting: Cardiology

## 2021-06-21 VITALS — BP 128/75 | HR 83 | Ht 66.0 in | Wt 284.0 lb

## 2021-06-21 DIAGNOSIS — K760 Fatty (change of) liver, not elsewhere classified: Secondary | ICD-10-CM | POA: Diagnosis not present

## 2021-06-21 DIAGNOSIS — R0602 Shortness of breath: Secondary | ICD-10-CM

## 2021-06-21 DIAGNOSIS — I509 Heart failure, unspecified: Secondary | ICD-10-CM

## 2021-06-21 DIAGNOSIS — R072 Precordial pain: Secondary | ICD-10-CM | POA: Diagnosis not present

## 2021-06-21 IMAGING — CT CT MAXILLOFACIAL W/O CM
1 series · 15 of 30 positions shown, 19 images · non-contrast
Comparison: None.

CLINICAL DATA: Chronic sinusitis with polyps

EXAM:
CT MAXILLOFACIAL WITHOUT CONTRAST
TECHNIQUE: Multidetector CT images of the paranasal sinuses were obtained using
the standard protocol without intravenous contrast.

[Series 4: soft tissue · axial · 0.35mm/px · z∈[-147,-42]mm · 15 of 113 slices shown, 19 images]
[im 4/113  brain]
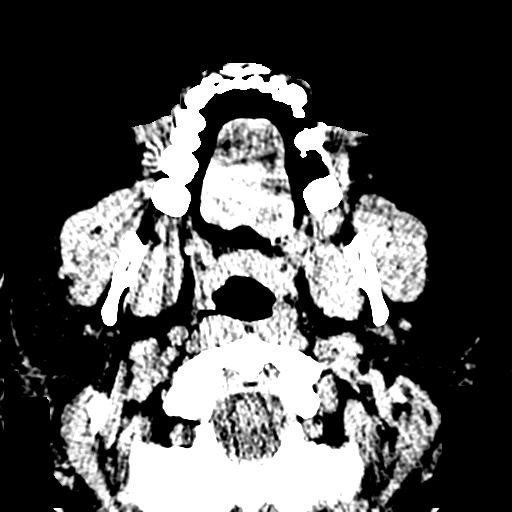
[im 4/113  bone]
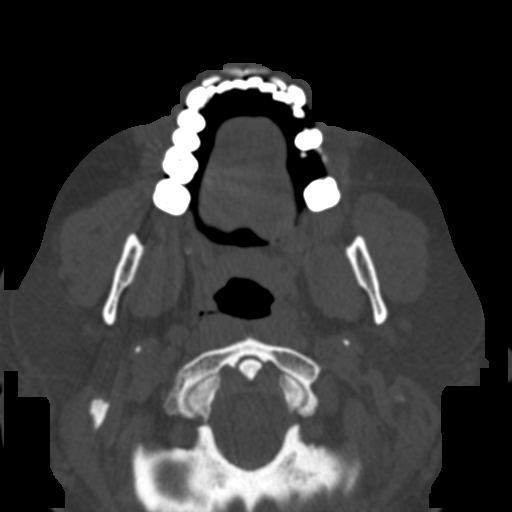
[im 12/113  bone]
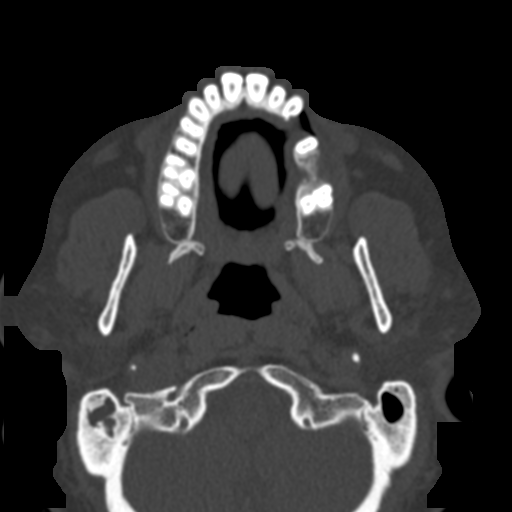
[im 20/113  bone]
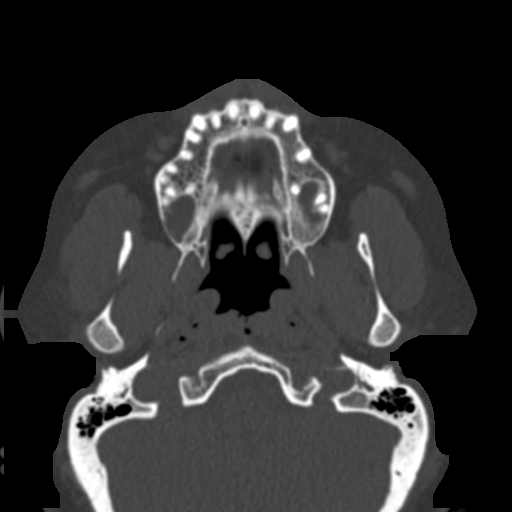
[im 28/113  bone]
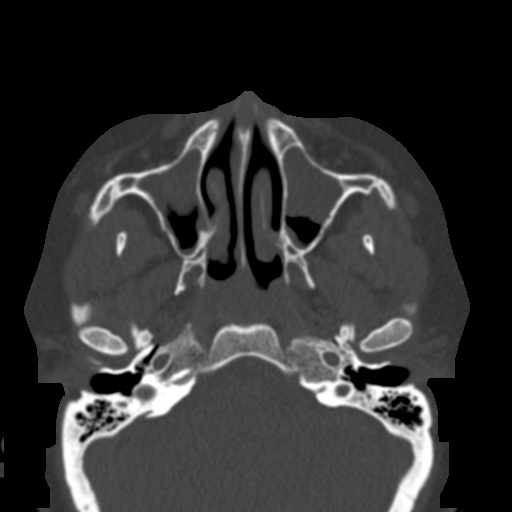
[im 35/113  brain]
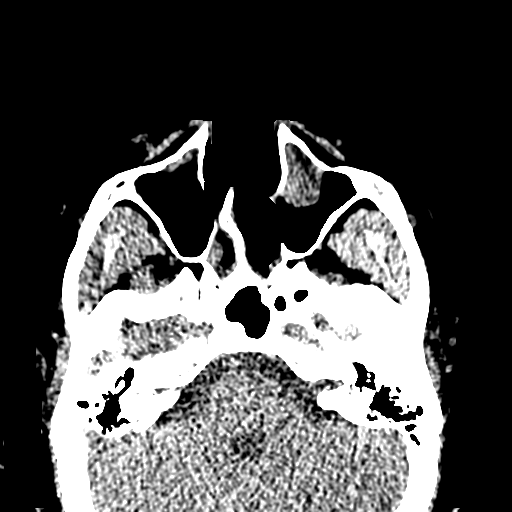
[im 35/113  bone]
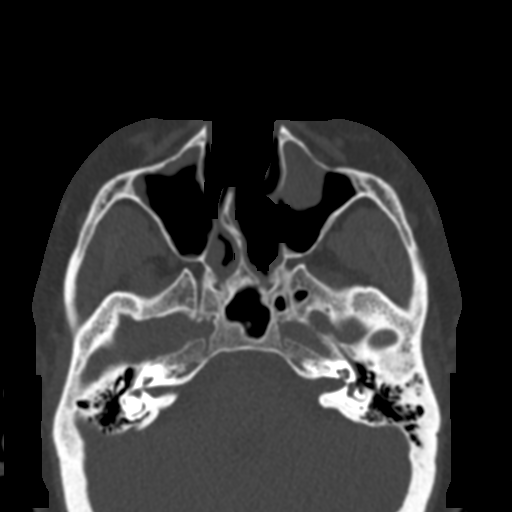
[im 43/113  bone]
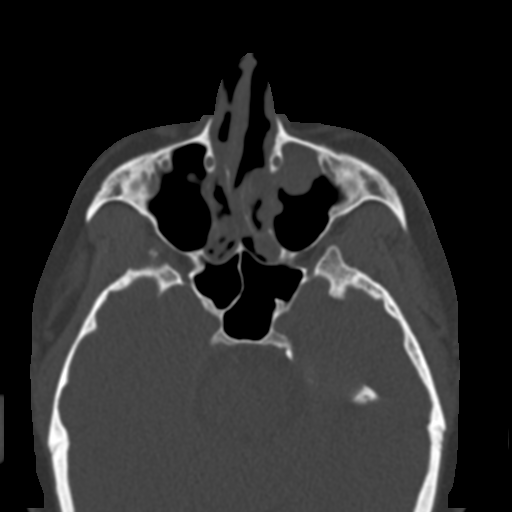
[im 51/113  bone]
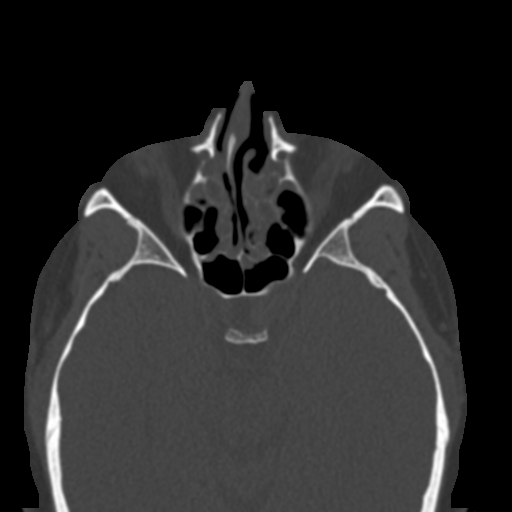
[im 58/113  bone]
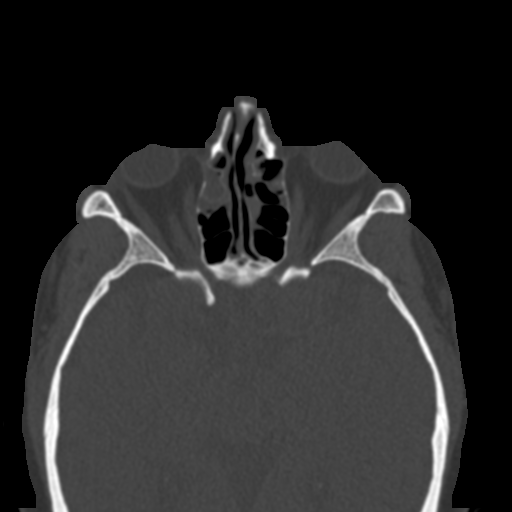
[im 62/113  brain]
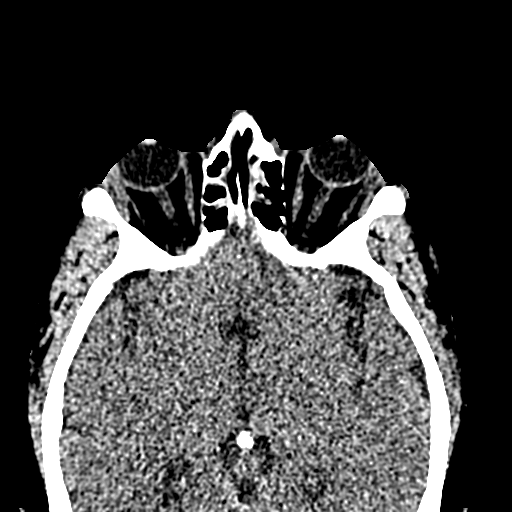
[im 62/113  bone]
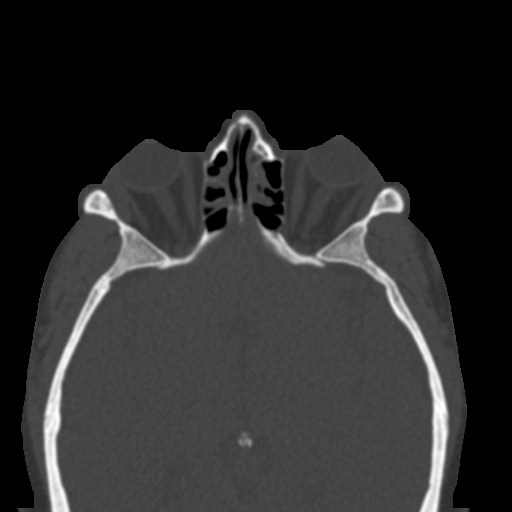
[im 70/113  bone]
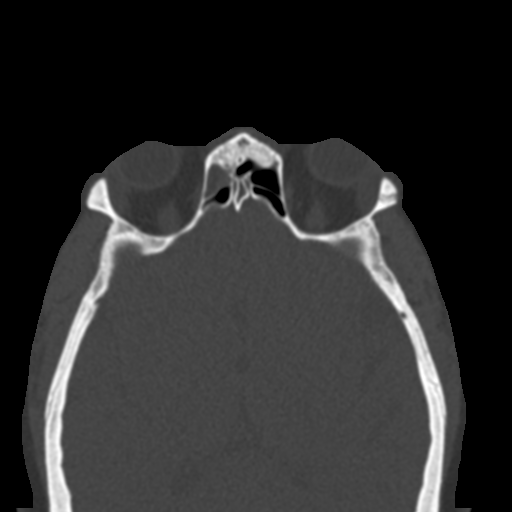
[im 78/113  bone]
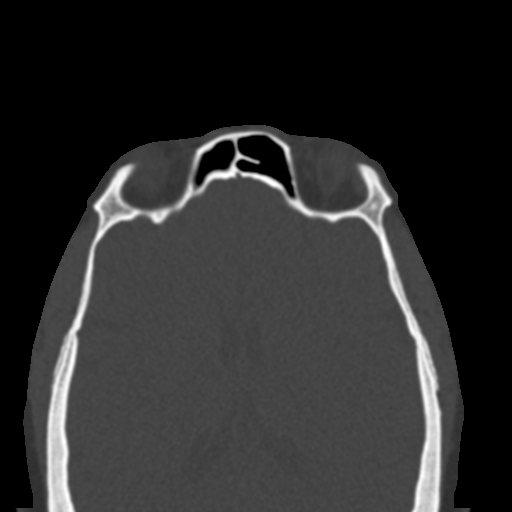
[im 85/113  bone]
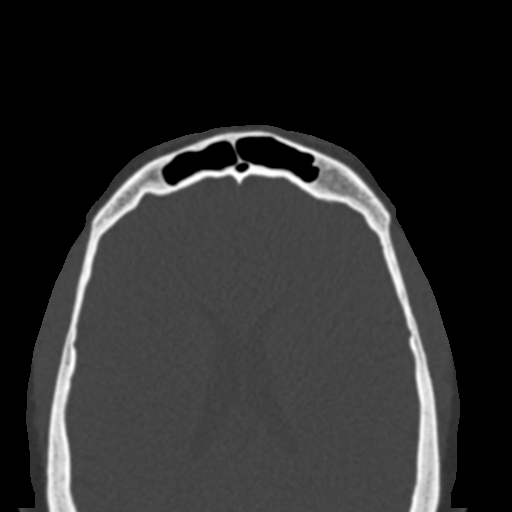
[im 93/113  brain]
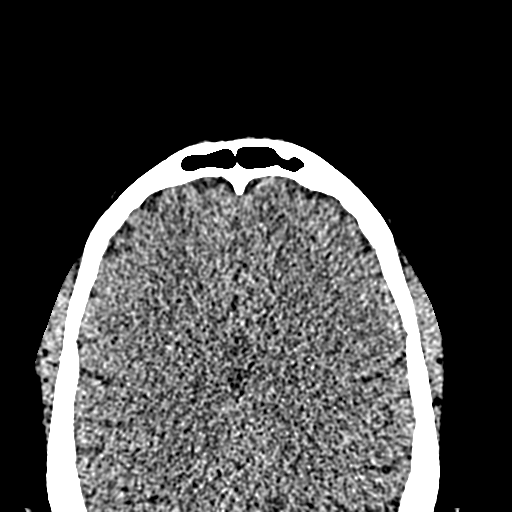
[im 93/113  bone]
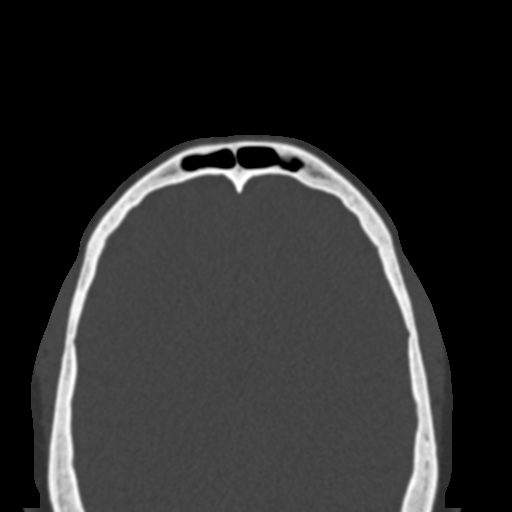
[im 101/113  bone]
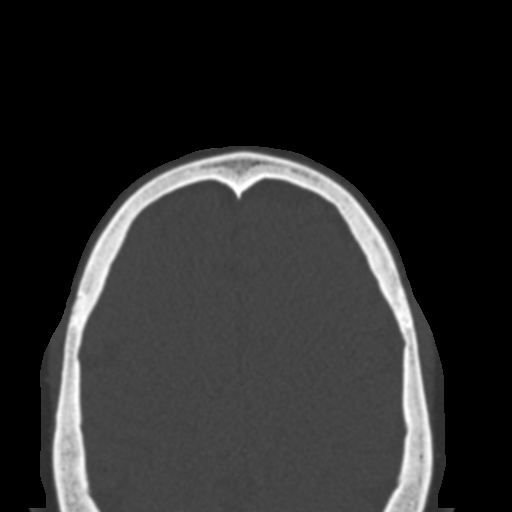
[im 109/113  bone]
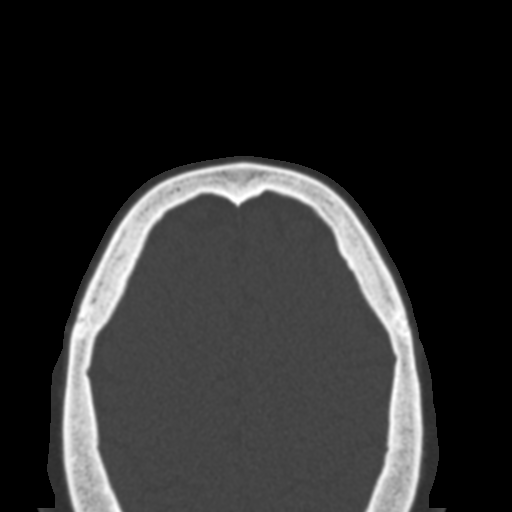

[15 of 30 positions shown; findings below may reference images not displayed]

FINDINGS: Paranasal sinuses:

Frontal: Mucosal thickening at the bilateral frontal ethmoidal
recesses.

Ethmoid: Patchy mucosal thickening bilaterally and diffusely.

Maxillary: Polypoid opacities in the bilateral nasal cavity. No
fluid level or circumferential mucosal thickening

Sphenoid: Mild polypoid mucosal thickening anteriorly on both sides.

Right ostiomeatal unit: Completely opacified infundibulum by mucosal
thickening. Middle meatus stenosis due to lateral positioning of the
turbinate.

Left ostiomeatal unit: Completely opacified infundibulum by mucosal
thickening. Patchy polypoid density at the middle meatus.

Nasal passages: Polypoid opacities bilaterally. Rightward nasal
septal deviation and spurring contacting the inferior turbinate.

Anatomy: Minimal pneumatization superior to anterior ethmoid
notches. Sellar sphenoid pneumatization pattern. No dehiscence of
carotid or optic canals. No onodi cell.

Other: No incidental finding on soft tissue windows.
IMPRESSION: 1. Generalized sinus and nasal cavity opacity with polypoid features
and sinus outflow obstruction.
2. Rightward nasal septal deviation and spurring

## 2021-06-21 MED ORDER — ROSUVASTATIN CALCIUM 20 MG PO TABS: 20.0000 mg | ORAL_TABLET | Freq: Every day | ORAL | 3 refills | Status: DC

## 2021-06-21 MED ORDER — METOPROLOL TARTRATE 100 MG PO TABS: ORAL_TABLET | ORAL | 0 refills | Status: DC

## 2021-06-21 NOTE — Patient Instructions (Signed)
Medication Instructions:   START ROSUVASTATIN 20 MG ONCE DAILY  *If you need a refill on your cardiac medications before your next appointment, please call your pharmacy*   Lab Work:  Your physician recommends that you return for lab work in: [facility]  If you have labs (blood work) drawn today and your tests are completely normal, you will receive your results only by: MyChart Message (if you have MyChart) OR A paper copy in the mail If you have any lab test that is abnormal or we need to change your treatment, we will call you to review the results.   Testing/Procedures:  Your physician has requested that you have an echocardiogram. Echocardiography is a painless test that uses sound waves to create images of your heart. It provides your doctor with information about the size and shape of your heart and how well your heart's chambers and valves are working. This procedure takes approximately one hour. There are no restrictions for this procedure. [address] FLOOR IMAGING DEPARTMENT     Your cardiac CT will be scheduled at   [facility] [address] [place], [place] [address] [phone]    If scheduled at [facility], please arrive at the [facility] (Entrance C2) of [facility] 30 minutes prior to test start time. You can use the FREE valet parking offered at entrance C (encouraged to control the heart rate for the test)  Proceed to the [facility] Radiology Department (first floor) to check-in and test prep.  All radiology patients and guests should use entrance C2 at [facility], accessed from [facility], even though the hospital's physical address listed is [address]     Please follow these instructions carefully (unless otherwise directed):    On the Night Before the Test: Be sure to Drink plenty of water. Do not consume any  caffeinated/decaffeinated beverages or chocolate 12 hours prior to your test. Do not take any antihistamines 12 hours prior to your test.   On the Day of the Test: Drink plenty of water until 1 hour prior to the test. Do not eat any food 4 hours prior to the test. You may take your regular medications prior to the test.  Take metoprolol (Lopressor) 100 MG two hours prior to test. HOLD Hydrochlorothiazide morning of the test. FEMALES- please wear underwire-free bra if available, avoid dresses & tight clothing       After the Test: Drink plenty of water. After receiving IV contrast, you may experience a mild flushed feeling. This is normal. On occasion, you may experience a mild rash up to 24 hours after the test. This is not dangerous. If this occurs, you can take Benadryl 25 mg and increase your fluid intake. If you experience trouble breathing, this can be serious. If it is severe call 911 IMMEDIATELY. If it is mild, please call our office. If you take any of these medications: Glipizide/Metformin, Avandament, Glucavance, please do not take 48 hours after completing test unless otherwise instructed.  We will call to schedule your test 2-4 weeks out understanding that some insurance companies will need an authorization prior to the service being performed.   For non-scheduling related questions, please contact the cardiac imaging nurse navigator should you have any questions/concerns: Rockwell Alexandria, Cardiac Imaging Nurse Navigator Larey Brick, Cardiac Imaging Nurse Navigator [facility] Direct Office Dial: [phone]   For scheduling needs, including  cancellations and rescheduling, please call [place], [phone].    Follow-Up: At [facility], you and your health needs are our priority.  As part of our continuing mission to provide you with exceptional heart care, we have created designated Provider Care Teams.  These Care Teams include your  primary Cardiologist (physician) and Advanced Practice Providers (APPs -  Physician Assistants and Nurse Practitioners) who all work together to provide you with the care you need, when you need it.  We recommend signing up for the patient portal called "MyChart".  Sign up information is provided on this After Visit Summary.  MyChart is used to connect with patients for Virtual Visits (Telemedicine).  Patients are able to view lab/test results, encounter notes, upcoming appointments, etc.  Non-urgent messages can be sent to your provider as well.   To learn more about what you can do with MyChart, go to [email or web address].    Your next appointment:   6 month(s)  The format for your next appointment:   In Person  Provider:   Olga Millers, MD     Important Information About Sugar

## 2021-06-22 ENCOUNTER — Telehealth (HOSPITAL_BASED_OUTPATIENT_CLINIC_OR_DEPARTMENT_OTHER): Payer: Self-pay

## 2021-06-22 NOTE — Telephone Encounter (Signed)
Patient called today Melissa Massey was denied by insurance. She would like this appealed I will start the appeal process for the patient.

## 2021-06-23 ENCOUNTER — Telehealth: Payer: Self-pay

## 2021-06-23 NOTE — Telephone Encounter (Signed)
Comments:

## 2021-06-29 ENCOUNTER — Ambulatory Visit (HOSPITAL_BASED_OUTPATIENT_CLINIC_OR_DEPARTMENT_OTHER)
Admission: RE | Admit: 2021-06-29 | Discharge: 2021-06-29 | Disposition: A | Discharge status: Home / Self Care | Payer: 59 | Source: Ambulatory Visit | Attending: Cardiology | Admitting: Cardiology

## 2021-06-29 DIAGNOSIS — I509 Heart failure, unspecified: Secondary | ICD-10-CM | POA: Diagnosis not present

## 2021-06-29 DIAGNOSIS — R0602 Shortness of breath: Secondary | ICD-10-CM

## 2021-06-29 LAB — ECHOCARDIOGRAM COMPLETE
Area-P 1/2: 3.23 cm2
S' Lateral: 3.1 cm

## 2021-06-29 NOTE — Progress Notes (Signed)
  Echocardiogram 2D Echocardiogram has been performed.  Melissa Massey F 06/29/2021, 4:13 PM

## 2021-07-03 ENCOUNTER — Encounter (HOSPITAL_BASED_OUTPATIENT_CLINIC_OR_DEPARTMENT_OTHER): Payer: Self-pay | Admitting: *Deleted

## 2021-07-04 ENCOUNTER — Other Ambulatory Visit (HOSPITAL_COMMUNITY): Payer: Self-pay | Admitting: *Deleted

## 2021-07-04 MED ORDER — METOPROLOL TARTRATE 100 MG PO TABS: ORAL_TABLET | ORAL | 0 refills | Status: DC

## 2021-07-10 ENCOUNTER — Telehealth (HOSPITAL_COMMUNITY): Payer: Self-pay | Admitting: *Deleted

## 2021-07-10 NOTE — Telephone Encounter (Signed)
Reaching out to patient to offer assistance regarding upcoming cardiac imaging study; pt verbalizes understanding of appt date/time, parking situation and where to check in, pre-test NPO status and medications ordered, and verified current allergies; name and call back number provided for further questions should they arise  Jahayra Mazo RN Navigator Cardiac Imaging [facility] [phone] office [phone] cell  Patient to take 100mg metoprolol tartrate two hours prior to her cardiac CT scan. She is aware to arrive at 2:30pm. 

## 2021-07-11 ENCOUNTER — Encounter (HOSPITAL_COMMUNITY): Payer: Self-pay

## 2021-07-11 ENCOUNTER — Ambulatory Visit (HOSPITAL_COMMUNITY)
Admission: RE | Admit: 2021-07-11 | Discharge: 2021-07-11 | Disposition: A | Discharge status: Home / Self Care | Payer: 59 | Source: Ambulatory Visit | Attending: Cardiology | Admitting: Cardiology

## 2021-07-11 DIAGNOSIS — I2510 Atherosclerotic heart disease of native coronary artery without angina pectoris: Secondary | ICD-10-CM | POA: Diagnosis not present

## 2021-07-11 DIAGNOSIS — R0602 Shortness of breath: Secondary | ICD-10-CM | POA: Insufficient documentation

## 2021-07-11 MED ORDER — NITROGLYCERIN 0.4 MG SL SUBL
SUBLINGUAL_TABLET | SUBLINGUAL | Status: AC
  Filled 2021-07-11: qty 2

## 2021-07-11 MED ORDER — NITROGLYCERIN 0.4 MG SL SUBL
0.8000 mg | SUBLINGUAL_TABLET | Freq: Once | SUBLINGUAL | Status: AC
  Administered 2021-07-11: 0.8 mg via SUBLINGUAL

## 2021-07-11 MED ORDER — IOHEXOL 350 MG/ML SOLN
100.0000 mL | Freq: Once | INTRAVENOUS | Status: AC | PRN
  Administered 2021-07-11: 100 mL via INTRAVENOUS

## 2021-07-24 ENCOUNTER — Telehealth: Payer: Self-pay | Admitting: *Deleted

## 2021-07-24 DIAGNOSIS — K760 Fatty (change of) liver, not elsewhere classified: Secondary | ICD-10-CM

## 2021-07-24 MED ORDER — ROSUVASTATIN CALCIUM 40 MG PO TABS: 40.0000 mg | ORAL_TABLET | Freq: Every day | ORAL | 3 refills | Status: DC

## 2021-07-24 NOTE — Telephone Encounter (Signed)
-----   Message from Lewayne Bunting, MD sent at 07/11/2021  4:30 PM EDT ----- Nonobstructive CAD; increase crestor to 40 mg daily; lipids and liver 8 weeks. Olga Millers

## 2021-07-24 NOTE — Telephone Encounter (Signed)
pt aware of results  New script sent to the pharmacy  Lab orders mailed to the pt  

## 2021-07-28 ENCOUNTER — Telehealth (HOSPITAL_BASED_OUTPATIENT_CLINIC_OR_DEPARTMENT_OTHER): Payer: Self-pay | Admitting: Nurse Practitioner

## 2021-07-28 NOTE — Telephone Encounter (Signed)
Received a fax from [employer] for Approval Nebulizer system approved from 07/28/21 to 07/29/22. Please advise.

## 2021-07-31 ENCOUNTER — Ambulatory Visit (HOSPITAL_BASED_OUTPATIENT_CLINIC_OR_DEPARTMENT_OTHER): Payer: 59

## 2021-08-07 ENCOUNTER — Encounter (HOSPITAL_BASED_OUTPATIENT_CLINIC_OR_DEPARTMENT_OTHER): Payer: Self-pay | Admitting: Nurse Practitioner

## 2021-08-09 ENCOUNTER — Ambulatory Visit (INDEPENDENT_AMBULATORY_CARE_PROVIDER_SITE_OTHER): Payer: 59 | Admitting: Nurse Practitioner

## 2021-08-09 VITALS — BP 128/82 | HR 82 | Ht 66.0 in | Wt 274.0 lb

## 2021-08-09 DIAGNOSIS — E538 Deficiency of other specified B group vitamins: Secondary | ICD-10-CM

## 2021-08-09 MED ORDER — CYANOCOBALAMIN 1000 MCG/ML IJ SOLN
1000.0000 ug | INTRAMUSCULAR | Status: DC
  Administered 2021-08-09: 1000 ug via INTRAMUSCULAR

## 2021-08-09 NOTE — Patient Instructions (Signed)
Patient presents for a Vitamin B12 injection. Patient denies any gastrointestinal issues or dizziness today. Patient tolerated injection well without complications. Patient is advised to schedule next Vitamin B12 injection in 30 days.   Butler Vegh/CMA

## 2021-08-15 ENCOUNTER — Encounter (HOSPITAL_BASED_OUTPATIENT_CLINIC_OR_DEPARTMENT_OTHER): Payer: Self-pay | Admitting: Nurse Practitioner

## 2021-08-23 ENCOUNTER — Telehealth (HOSPITAL_BASED_OUTPATIENT_CLINIC_OR_DEPARTMENT_OTHER): Payer: Self-pay

## 2021-08-23 ENCOUNTER — Other Ambulatory Visit (HOSPITAL_BASED_OUTPATIENT_CLINIC_OR_DEPARTMENT_OTHER): Payer: Self-pay

## 2021-08-23 DIAGNOSIS — F900 Attention-deficit hyperactivity disorder, predominantly inattentive type: Secondary | ICD-10-CM

## 2021-08-23 DIAGNOSIS — E1165 Type 2 diabetes mellitus with hyperglycemia: Secondary | ICD-10-CM

## 2021-08-23 DIAGNOSIS — M0579 Rheumatoid arthritis with rheumatoid factor of multiple sites without organ or systems involvement: Secondary | ICD-10-CM

## 2021-08-23 MED ORDER — VENLAFAXINE HCL ER 150 MG PO CP24: 150.0000 mg | ORAL_CAPSULE | Freq: Every day | ORAL | 0 refills | Status: DC

## 2021-08-23 MED ORDER — CELECOXIB 200 MG PO CAPS: 200.0000 mg | ORAL_CAPSULE | Freq: Two times a day (BID) | ORAL | 0 refills | Status: DC

## 2021-08-23 MED ORDER — METFORMIN HCL ER 500 MG PO TB24: 1000.0000 mg | ORAL_TABLET | Freq: Two times a day (BID) | ORAL | 0 refills | Status: DC

## 2021-08-23 NOTE — Telephone Encounter (Signed)
I received order from [facility] for diagnostic mammogram w/ultrasound. With Minna Merritts being out of the office I got permission from Dr Tommi Rumps [place] to fax order back to [facility] so patient could have mammogram on 08/24/2021. It was faxed back with conformation. Patient is aware.

## 2021-08-24 DIAGNOSIS — N6311 Unspecified lump in the right breast, upper outer quadrant: Secondary | ICD-10-CM | POA: Diagnosis not present

## 2021-08-24 DIAGNOSIS — R928 Other abnormal and inconclusive findings on diagnostic imaging of breast: Secondary | ICD-10-CM | POA: Diagnosis not present

## 2021-09-10 ENCOUNTER — Other Ambulatory Visit (HOSPITAL_BASED_OUTPATIENT_CLINIC_OR_DEPARTMENT_OTHER): Payer: Self-pay | Admitting: Nurse Practitioner

## 2021-09-10 DIAGNOSIS — F339 Major depressive disorder, recurrent, unspecified: Secondary | ICD-10-CM

## 2021-09-10 DIAGNOSIS — F419 Anxiety disorder, unspecified: Secondary | ICD-10-CM

## 2021-09-12 ENCOUNTER — Telehealth (HOSPITAL_BASED_OUTPATIENT_CLINIC_OR_DEPARTMENT_OTHER): Payer: Self-pay | Admitting: Nurse Practitioner

## 2021-09-12 NOTE — Telephone Encounter (Signed)
Pts significant other came by the office on 09/12/21 @ 4:30 to pick up medication that was left for pt to pick up on Friday 09/08/21.

## 2021-09-13 ENCOUNTER — Ambulatory Visit (INDEPENDENT_AMBULATORY_CARE_PROVIDER_SITE_OTHER): Payer: 59 | Admitting: Nurse Practitioner

## 2021-09-13 ENCOUNTER — Other Ambulatory Visit (HOSPITAL_BASED_OUTPATIENT_CLINIC_OR_DEPARTMENT_OTHER): Payer: Self-pay | Admitting: Nurse Practitioner

## 2021-09-13 VITALS — BP 128/92 | HR 82 | Ht 66.0 in | Wt 276.0 lb

## 2021-09-13 DIAGNOSIS — M791 Myalgia, unspecified site: Secondary | ICD-10-CM | POA: Diagnosis not present

## 2021-09-13 DIAGNOSIS — E538 Deficiency of other specified B group vitamins: Secondary | ICD-10-CM

## 2021-09-13 DIAGNOSIS — Z6841 Body Mass Index (BMI) 40.0 and over, adult: Secondary | ICD-10-CM | POA: Diagnosis not present

## 2021-09-13 DIAGNOSIS — H04123 Dry eye syndrome of bilateral lacrimal glands: Secondary | ICD-10-CM | POA: Diagnosis not present

## 2021-09-13 DIAGNOSIS — M0579 Rheumatoid arthritis with rheumatoid factor of multiple sites without organ or systems involvement: Secondary | ICD-10-CM | POA: Diagnosis not present

## 2021-09-13 DIAGNOSIS — D509 Iron deficiency anemia, unspecified: Secondary | ICD-10-CM | POA: Diagnosis not present

## 2021-09-13 MED ORDER — CYANOCOBALAMIN 1000 MCG/ML IJ SOLN
1000.0000 ug | INTRAMUSCULAR | Status: DC
  Administered 2021-09-13: 1000 ug via INTRAMUSCULAR

## 2021-09-13 NOTE — Patient Instructions (Signed)
Patient came in today received B12 with no complications.

## 2021-09-14 LAB — FERRITIN: Ferritin: 9 ng/mL — ABNORMAL LOW (ref 15–150)

## 2021-09-14 LAB — IRON AND TIBC
Iron Saturation: 6 % — CL (ref 15–55)
Iron: 24 ug/dL — ABNORMAL LOW (ref 27–159)
Total Iron Binding Capacity: 381 ug/dL (ref 250–450)
UIBC: 357 ug/dL (ref 131–425)

## 2021-09-14 LAB — VITAMIN B12: Vitamin B-12: 225 pg/mL — ABNORMAL LOW (ref 232–1245)

## 2021-09-14 LAB — VITAMIN D 25 HYDROXY (VIT D DEFICIENCY, FRACTURES): Vit D, 25-Hydroxy: 26 ng/mL — ABNORMAL LOW (ref 30.0–100.0)

## 2021-09-21 ENCOUNTER — Other Ambulatory Visit (HOSPITAL_BASED_OUTPATIENT_CLINIC_OR_DEPARTMENT_OTHER): Payer: Self-pay | Admitting: Family Medicine

## 2021-09-21 ENCOUNTER — Other Ambulatory Visit (HOSPITAL_BASED_OUTPATIENT_CLINIC_OR_DEPARTMENT_OTHER): Payer: Self-pay | Admitting: Nurse Practitioner

## 2021-09-21 DIAGNOSIS — E1165 Type 2 diabetes mellitus with hyperglycemia: Secondary | ICD-10-CM

## 2021-09-21 DIAGNOSIS — M0579 Rheumatoid arthritis with rheumatoid factor of multiple sites without organ or systems involvement: Secondary | ICD-10-CM

## 2021-09-21 DIAGNOSIS — F900 Attention-deficit hyperactivity disorder, predominantly inattentive type: Secondary | ICD-10-CM

## 2021-09-27 ENCOUNTER — Encounter (HOSPITAL_BASED_OUTPATIENT_CLINIC_OR_DEPARTMENT_OTHER): Payer: Self-pay | Admitting: Nurse Practitioner

## 2021-09-28 ENCOUNTER — Other Ambulatory Visit (HOSPITAL_BASED_OUTPATIENT_CLINIC_OR_DEPARTMENT_OTHER): Payer: Self-pay | Admitting: Nurse Practitioner

## 2021-09-28 DIAGNOSIS — J4542 Moderate persistent asthma with status asthmaticus: Secondary | ICD-10-CM

## 2021-09-29 NOTE — Telephone Encounter (Signed)
Provider requested you follow up with patient. Review patient labs, provider comments, communicate with patient, and report to provider.

## 2021-10-02 ENCOUNTER — Other Ambulatory Visit (HOSPITAL_BASED_OUTPATIENT_CLINIC_OR_DEPARTMENT_OTHER): Payer: Self-pay

## 2021-10-02 DIAGNOSIS — E611 Iron deficiency: Secondary | ICD-10-CM

## 2021-10-03 ENCOUNTER — Telehealth: Payer: Self-pay | Admitting: Internal Medicine

## 2021-10-03 NOTE — Telephone Encounter (Signed)
Scheduled appt per 9/11 referral. Pt is aware of appt date and time. Pt is aware to arrive 15 mins prior to appt time and to bring and updated insurance card. Pt is aware of appt location.   

## 2021-10-11 ENCOUNTER — Telehealth (HOSPITAL_BASED_OUTPATIENT_CLINIC_OR_DEPARTMENT_OTHER): Payer: Self-pay

## 2021-10-11 ENCOUNTER — Telehealth: Payer: Self-pay | Admitting: Nurse Practitioner

## 2021-10-11 NOTE — Telephone Encounter (Signed)
R/s pt's new hem appt, called pt, no answer and vm was full, unable to leave a msg. Appt will update in pt's MyChart. Updated calendar also sent to pt.

## 2021-10-11 NOTE — Telephone Encounter (Signed)
Patient was denied through cover my meds for [facility]. I tried to appeal, her insurance requires a prior to prior review. This was placed in Sarabeth's inbox.

## 2021-10-17 ENCOUNTER — Inpatient Hospital Stay: Payer: 59

## 2021-10-17 ENCOUNTER — Inpatient Hospital Stay: Payer: 59 | Admitting: Internal Medicine

## 2021-10-18 ENCOUNTER — Inpatient Hospital Stay: Payer: 59

## 2021-10-18 ENCOUNTER — Inpatient Hospital Stay: Payer: 59 | Attending: Internal Medicine | Admitting: Nurse Practitioner

## 2021-10-18 ENCOUNTER — Other Ambulatory Visit: Payer: Self-pay

## 2021-10-18 VITALS — BP 136/76 | HR 78 | Temp 98.3°F | Resp 15 | Wt 275.1 lb

## 2021-10-18 DIAGNOSIS — E079 Disorder of thyroid, unspecified: Secondary | ICD-10-CM | POA: Diagnosis not present

## 2021-10-18 DIAGNOSIS — E559 Vitamin D deficiency, unspecified: Secondary | ICD-10-CM | POA: Insufficient documentation

## 2021-10-18 DIAGNOSIS — K5792 Diverticulitis of intestine, part unspecified, without perforation or abscess without bleeding: Secondary | ICD-10-CM | POA: Insufficient documentation

## 2021-10-18 DIAGNOSIS — Z8041 Family history of malignant neoplasm of ovary: Secondary | ICD-10-CM

## 2021-10-18 DIAGNOSIS — D509 Iron deficiency anemia, unspecified: Secondary | ICD-10-CM | POA: Insufficient documentation

## 2021-10-18 DIAGNOSIS — E538 Deficiency of other specified B group vitamins: Secondary | ICD-10-CM

## 2021-10-18 DIAGNOSIS — G629 Polyneuropathy, unspecified: Secondary | ICD-10-CM | POA: Diagnosis not present

## 2021-10-18 DIAGNOSIS — E611 Iron deficiency: Secondary | ICD-10-CM

## 2021-10-18 DIAGNOSIS — E119 Type 2 diabetes mellitus without complications: Secondary | ICD-10-CM | POA: Insufficient documentation

## 2021-10-18 LAB — CBC WITH DIFFERENTIAL (CANCER CENTER ONLY)
Abs Immature Granulocytes: 0.02 10*3/uL (ref 0.00–0.07)
Basophils Absolute: 0 10*3/uL (ref 0.0–0.1)
Basophils Relative: 0 %
Eosinophils Absolute: 0 10*3/uL (ref 0.0–0.5)
Eosinophils Relative: 0 %
HCT: 32 % — ABNORMAL LOW (ref 36.0–46.0)
Hemoglobin: 10.1 g/dL — ABNORMAL LOW (ref 12.0–15.0)
Immature Granulocytes: 0 %
Lymphocytes Relative: 18 %
Lymphs Abs: 1.6 10*3/uL (ref 0.7–4.0)
MCH: 24.3 pg — ABNORMAL LOW (ref 26.0–34.0)
MCHC: 31.6 g/dL (ref 30.0–36.0)
MCV: 76.9 fL — ABNORMAL LOW (ref 80.0–100.0)
Monocytes Absolute: 0.5 10*3/uL (ref 0.1–1.0)
Monocytes Relative: 6 %
Neutro Abs: 6.5 10*3/uL (ref 1.7–7.7)
Neutrophils Relative %: 76 %
Platelet Count: 364 10*3/uL (ref 150–400)
RBC: 4.16 MIL/uL (ref 3.87–5.11)
RDW: 16.4 % — ABNORMAL HIGH (ref 11.5–15.5)
WBC Count: 8.6 10*3/uL (ref 4.0–10.5)
nRBC: 0 % (ref 0.0–0.2)

## 2021-10-18 LAB — IRON AND IRON BINDING CAPACITY (CC-WL,HP ONLY)
Iron: 16 ug/dL — ABNORMAL LOW (ref 28–170)
Saturation Ratios: 4 % — ABNORMAL LOW (ref 10.4–31.8)
TIBC: 452 ug/dL — ABNORMAL HIGH (ref 250–450)
UIBC: 436 ug/dL (ref 148–442)

## 2021-10-18 LAB — VITAMIN B12: Vitamin B-12: 190 pg/mL (ref 180–914)

## 2021-10-18 LAB — RETIC PANEL
Immature Retic Fract: 31 % — ABNORMAL HIGH (ref 2.3–15.9)
RBC.: 4.19 MIL/uL (ref 3.87–5.11)
Retic Count, Absolute: 132.4 10*3/uL (ref 19.0–186.0)
Retic Ct Pct: 3.2 % — ABNORMAL HIGH (ref 0.4–3.1)
Reticulocyte Hemoglobin: 24.8 pg — ABNORMAL LOW (ref >27.9)

## 2021-10-18 LAB — FERRITIN: Ferritin: 5 ng/mL — ABNORMAL LOW (ref 11–307)

## 2021-10-18 MED ORDER — CYANOCOBALAMIN 1000 MCG/ML IJ SOLN
1000.0000 ug | Freq: Once | INTRAMUSCULAR | Status: AC
  Administered 2021-10-18: 1000 ug via INTRAMUSCULAR
  Filled 2021-10-18: qty 1

## 2021-10-18 NOTE — Progress Notes (Addendum)
[place]   [phone] Fax:([phone]) [place] consult Note   Patient Care Team: Early, Coralee Pesa, NP as PCP - General (Nurse Practitioner) Date of Service: 10/18/2021  CHIEF COMPLAINTS/PURPOSE OF CONSULTATION:  Iron deficiency anemia, referred by PCP Jacolyn Reedy, NP  HISTORY OF PRESENTING ILLNESS:  Melissa Massey 46 y.o. female with PMH including controlled asthma, ADHD, anxiety/depression, thyroid dysfunction, HTN, NASH, PCOS, RA, steroid-induced diabetes, diverticulitis, and vitamin D deficiency is here because of iron deficiency anemia.  She was found to have abnormal CBC from 01/09/2010 with mild anemia, Hgb 11.8 which was again noted on 12/22/2012.  Her CBC was normal on 04/11/2013.  There is a gap in her records until next CBC 03/29/2019, Hgb 11.5 with normal MCV, and dropped to 10.7 on 11/20/2019.  Most recently had worsening anemia on 05/30/2021 CBC Hgb 9.8, MCV 80.  Further work-up showed low vitamin B12 at 153, normal folate and slightly high TSH (5.880).  She began monthly IM B12 supplementation and daily oral iron (ferrous sulfate 325 mg) starting in May 2023 which she takes with her regular medication including PPI. She developed abdominal pain and was diagnosed with diverticulitis on CT 06/09/21. Iron level from 09/13/21 show 6% iron sat, serum iron 24, and ferritin 9. B12 has only slightly improved to 225.  She has no history of abdominal/bariatric surgery except laparoscopic cyst removal and 2 C-sections.  She is not on anticoagulation or antiplatelet medication, never received a blood transfusion.  Socially, she is engaged, has 4 children ages 16-adult.  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  She is independent with ADLs and drives.  She denies alcohol, tobacco, or other drug use.  She is up-to-date on breast and cervical cancer screening.  She was scheduled for colonoscopy but changed insurance and had to cancel, she is requesting referral to [facility].   Family history is significant for a mother with iron deficiency and anemia, sister had neuroblastoma at 1 months and is deceased.  A paternal grandmother had breast and ovarian cancer, that person's daughter (patient's paternal aunt) had breast cancer in her 67s.  Patient is interested in genetic testing if eligible.  Today she presents by herself, not Massey well in general, notes "something is not right with my body." She is fatigued with other symptoms of anemia including dizziness, palpitations, and exertional dyspnea. She has pica with ice. Eats low fat/low carb regular diet.  She has 29 pounds intentional weight loss since May.  LMP 09/17/2021, periods are irregular and lighter now that she is perimenopausal.  Denies rectal bleeding or GI symptoms.  She has baseline RA pain in her wrists, knees, feet, and left hip.she has neuropathy more in feet than hands, especially at night.    MEDICAL HISTORY:  Past Medical History:  Diagnosis Date   Arthritis    Asthma    CHF (congestive heart failure) ([place])    Diabetes mellitus without complication (HCC)    DVT (deep venous thrombosis) (HCC)    Graves disease    Hyperlipidemia    Hypertension    PCOS (polycystic ovarian syndrome)    Rheumatoid arthritis ([place])     SURGICAL HISTORY: Past Surgical History:  Procedure Laterality Date   CESAREAN SECTION     x2   LUMBAR FUSION     TUBAL LIGATION      SOCIAL HISTORY: Social History   Socioeconomic History   Marital status: Single    Spouse name: Not on  file   Number of children: 4   Years of education: Not on file   Highest education level: Not on file  Occupational History   Not on file  Tobacco Use   Smoking status: Former    Packs/day: 1.00    Types: Cigarettes   Smokeless tobacco: Never  Vaping Use   Vaping Use: Every day   Substances: Nicotine  Substance and Sexual Activity   Alcohol use: No   Drug use: No   Sexual activity: Yes    Birth control/protection: Surgical   Other Topics Concern   Not on file  Social History Narrative   Not on file   Social Determinants of Health   Financial Resource Strain: Not on file  Food Insecurity: Not on file  Transportation Needs: Not on file  Physical Activity: Not on file  Stress: Not on file  Social Connections: Not on file  Intimate Partner Violence: Not on file    FAMILY HISTORY: Family History  Problem Relation Age of Onset   Heart attack Mother    Stroke Mother    Diabetes Mother    Hypertension Father    Cancer Sister 1       neuroblastoma   Cancer Paternal Aunt 11       breast   Cancer Paternal Grandmother        breast and ovarian    ALLERGIES:  has No Known Allergies.  MEDICATIONS:  Current Outpatient Medications  Medication Sig Dispense Refill   albuterol (PROVENTIL) (2.5 MG/3ML) 0.083% nebulizer solution Take 3 mLs (2.5 mg total) by nebulization every 4 (four) hours as needed for wheezing or shortness of breath (please include nebulizer machine, hoses, and mask if needed.). 185 mL 6   albuterol (VENTOLIN HFA) 108 (90 Base) MCG/ACT inhaler INHALE 2 PUFFS BY MOUTH EVERY 6 HOURS AS NEEDED FOR WHEEZING FOR SHORTNESS OF BREATH 9 g 0   amphetamine-dextroamphetamine (ADDERALL XR) 20 MG 24 hr capsule Take 2 capsules (40 mg total) by mouth daily. 60 capsule 0   BREO ELLIPTA 200-25 MCG/ACT AEPB Inhale 1 puff into the lungs daily.     busPIRone (BUSPAR) 7.5 MG tablet TAKE 1 TABLET BY MOUTH THREE TIMES DAILY 90 tablet 0   celecoxib (CELEBREX) 200 MG capsule Take 1 capsule by mouth twice daily 60 capsule 0   cyclobenzaprine (FLEXERIL) 5 MG tablet Take 5 mg by mouth at bedtime as needed for muscle spasms.     esomeprazole (NEXIUM) 40 MG capsule Take 40 mg by mouth 2 (two) times daily.     etanercept (ENBREL SURECLICK) 50 MG/ML injection 50mg      glipiZIDE (GLUCOTROL) 5 MG tablet Take 5 mg by mouth 2 (two) times daily before a meal.     hydrochlorothiazide (HYDRODIURIL) 25 MG tablet Take 25 mg by  mouth daily.     Iron, Ferrous Sulfate, 325 (65 Fe) MG TABS Take 325 mg by mouth daily. 90 tablet 1   levothyroxine (SYNTHROID) 125 MCG tablet Take 250 mcg by mouth daily.     liothyronine (CYTOMEL) 5 MCG tablet Take 5 mcg by mouth 2 (two) times daily.     losartan (COZAAR) 100 MG tablet Take 100 mg by mouth daily.     metFORMIN (GLUCOPHAGE-XR) 500 MG 24 hr tablet TAKE 2 TABLETS BY MOUTH IN THE MORNING AND 2 AT BEDTIME 120 tablet 0   metoprolol tartrate (LOPRESSOR) 100 MG tablet Take 2 hours prior to ct scan 1 tablet 0   montelukast (SINGULAIR) 10  MG tablet Take 10 mg by mouth daily.     ondansetron (ZOFRAN) 8 MG tablet Take 1 tablet (8 mg total) by mouth every 8 (eight) hours as needed for nausea or vomiting. 20 tablet 2   predniSONE (DELTASONE) 10 MG tablet Take 10 mg by mouth 2 (two) times daily.     promethazine (PHENERGAN) 25 MG tablet Take 1 tablet (25 mg total) by mouth every 8 (eight) hours as needed for nausea or vomiting. 20 tablet 0   rosuvastatin (CRESTOR) 40 MG tablet Take 1 tablet (40 mg total) by mouth daily. 90 tablet 3   tirzepatide (MOUNJARO) 2.5 MG/0.5ML Pen Inject 2.5 mg into the skin once a week. 2 mL 0   venlafaxine XR (EFFEXOR-XR) 150 MG 24 hr capsule TAKE 1 CAPSULE BY MOUTH ONCE DAILY WITH BREAKFAST 30 capsule 0   Vitamin D, Ergocalciferol, (DRISDOL) 1.25 MG (50000 UNIT) CAPS capsule Take 50,000 Units by mouth every Sunday.     Current Facility-Administered Medications  Medication Dose Route Frequency Provider Last Rate Last Admin   cyanocobalamin ((VITAMIN B-12)) injection 1,000 mcg  1,000 mcg Intramuscular Q30 days Early, Coralee Pesa, NP   1,000 mcg at 06/13/21 1630   cyanocobalamin ((VITAMIN B-12)) injection 1,000 mcg  1,000 mcg Intramuscular Q30 days Early, Coralee Pesa, NP   1,000 mcg at 08/09/21 1212   cyanocobalamin (VITAMIN B12) injection 1,000 mcg  1,000 mcg Intramuscular Q30 days Early, Coralee Pesa, NP   1,000 mcg at 09/13/21 1345    REVIEW OF SYSTEMS:   Constitutional:  Denies fevers, chills or abnormal night sweats (+) fatigue (+) intentional weight loss  Eyes: Denies blurriness of vision, double vision or watery eyes Ears, nose, mouth, throat, and face: Denies mucositis or sore throat Respiratory: Denies cough or wheezes (+) exertional dyspnea  Cardiovascular: Denies chest discomfort or lower extremity swelling (+) palpitations  Gastrointestinal:  Denies nausea, heartburn, hematochezia, or change in bowel habits Skin: Denies abnormal skin rashes MSK: (+) RA related joint pain  Lymphatics: Denies new lymphadenopathy or easy bruising Neurological:Denies new weaknesses (+) dizziness (+) neuropathy feet > hands Behavioral/Psych: Mood is stable, no new changes (+) h/o anxiety and depression (+) ADHD All other systems were reviewed with the patient and are negative.  PHYSICAL EXAMINATION: ECOG PERFORMANCE STATUS: 1 - Symptomatic but completely ambulatory  Vitals:   10/18/21 1116  BP: 136/76  Pulse: 78  Resp: 15  Temp: 98.3 F (36.8 C)  SpO2: 97%   Filed Weights   10/18/21 1116  Weight: 275 lb 1.6 oz (124.8 kg)    GENERAL:alert, no distress and comfortable SKIN: no rash  EYES: sclera clear NECK: without mass  LYMPH:  no palpable cervical or supraclavicular lymphadenopathy  LUNGS: clear to auscultation with normal breathing effort HEART: regular rate & rhythm, no lower extremity edema ABDOMEN:abdomen soft, non-tender and normal bowel sounds Musculoskeletal:no cyanosis of digits and no clubbing  PSYCH: alert & oriented x 3 with fluent speech NEURO: no focal motor/sensory deficits  LABORATORY DATA:  I have reviewed the data as listed    Latest Ref Rng & Units 10/18/2021   12:26 PM 05/30/2021    1:43 PM 07/31/2013    1:45 PM  CBC  WBC 4.0 - 10.5 K/uL 8.6  10.1  7.0   Hemoglobin 12.0 - 15.0 g/dL 10.1  9.8  12.8   Hematocrit 36.0 - 46.0 % 32.0  31.8  37.9   Platelets 150 - 400 K/uL 364  389  352  Latest Ref Rng & Units 05/30/2021     1:43 PM 07/31/2013    6:45 PM 07/31/2013    1:45 PM  CMP  Glucose 70 - 99 mg/dL 301  80  83   BUN 6 - 24 mg/dL 10  11  11    Creatinine 0.57 - 1.00 mg/dL 0.67  0.95  0.94   Sodium 134 - 144 mmol/L 138  138  141   Potassium 3.5 - 5.2 mmol/L 4.2  3.6  3.9   Chloride 96 - 106 mmol/L 99  96  99   CO2 20 - 29 mmol/L 20  25  23    Calcium 8.7 - 10.2 mg/dL 9.0  9.3  9.3   Total Protein 6.0 - 8.5 g/dL 6.3   7.6   Total Bilirubin 0.0 - 1.2 mg/dL 0.2   <0.2   Alkaline Phos 44 - 121 IU/L 91   82   AST 0 - 40 IU/L 21   19   ALT 0 - 32 IU/L 35   24      RADIOGRAPHIC STUDIES: I have personally reviewed the radiological images as listed and agreed with the findings in the report. No results found.  ASSESSMENT & PLAN: 46 yo perimenopausal female   Anemia, secondary to Iron and B12 deficiency  -We reviewed her medical record in detail with the patient. She has h/o mild intermittent since at least 2011, with Hgb in the 10-11 range until 05/2021 when she was found to have Hgb 9.8 (around time she was diagnosed with diverticulitis) with evidence of IDA and B12 deficiency  -She started oral iron and monthly B12 injections in 05/2021, she tolerates well overall.  -she is perimenopausal and having lighter periods, likely not the source of IDA. We recommend GI work up to further evaluate. We have referred her to [facility] per pt request.  -Due to her RA, we reviewed the potential of auto-immune cause. Intrinsic factor antibodies are normal.  -Ms. Phann presents with symptomatic anemia, mainly fatigue, dizziness, exertional dyspnea, and palpitations.  -Labs at this consult show Hgb 10.1, ferritin 5, erum iron 16, 4% sat, and high TIBC 452, and reticulocyte hgb 24 which is consistent with IDA. B12 is low at 190 despite monthly injections for past 4 months. Unfortunately she has not responded well to oral iron or monthly B12 inj. We discussed separating oral iron from PPI and taking with vit C to promote  absorption. -We recommend loading B12 injection weekly x4, then monthly, and IV iron with venofer 400 mg weekly x3. Potential benefit/risk and SE's of IV iron were reviewed, especially risk of anaphylaxis. Will premed with claritin. She agrees to proceed.  -We feel primary bone marrow disorder is less likely, given no other cytopenias. There is also possibility of anemia of chronic disease, given her multiple other medical conditions.  -If her anemia does not resolve with adequate Iron and B12 replacement, we will recommend additional testing.  -will check lab after iron replacement, then in 3 and 6 months -f/up in 6 months, or sooner if needed -Pt seen with Dr. Burr Medico  Neuropathy -Likely secondary to B12 deficiency and DM -Will see if this improves with adequate B12 replacement -continue monitoring  3. Family history -We discussed her family history; mother has h/o IDA -She had a sister with neuroblastoma at 29 months old who is deceased; PGM had breast and ovarian cancer, and that person's daughter (pt's paternal aunt) had breast cancer in her 77's.  -She is  interested in genetic testing if she is a candidate, will discuss with genetics  4. Co-morbidities: asthma, ADHD, anxiety/depression, NASH, OSA, PCOS, RA, steroid induced DM, and diverticulitis  -Continue med regimen and management per PCP  PLAN: -medical record and today's labs reviewed -loading dose B12 injection weekly x4, then monthly -IV Venofer 400 mg with claritin premed weekly x3 -Referral to [facility] for [place] work up and genetics for family h/o breast and ovarian cancers -Discuss family history with genetic counselor -Lab in 1, 3, and 6 months -f/up in 6 months, or sooner if needed  Orders Placed This Encounter  Procedures   CBC with Differential ([facility] Only)    Standing Status:   Standing    Number of Occurrences:   12    Standing Expiration Date:   10/19/2022   Ferritin    Standing Status:   Standing     Number of Occurrences:   12    Standing Expiration Date:   10/19/2022   Iron and Iron Binding Capacity ([facility] only)    Standing Status:   Standing    Number of Occurrences:   12    Standing Expiration Date:   10/19/2022   Intrinsic factor antibodies    Standing Status:   Standing    Number of Occurrences:   1    Standing Expiration Date:   10/19/2022   Vitamin B12    Standing Status:   Standing    Number of Occurrences:   12    Standing Expiration Date:   10/19/2022   Retic Panel    Standing Status:   Standing    Number of Occurrences:   1    Standing Expiration Date:   10/19/2022   Ambulatory referral to Gastroenterology    Referral Priority:   Routine    Referral Type:   Consultation    Referral Reason:   Specialty Services Required    Number of Visits Requested:   1   Ambulatory referral to Genetics    Referral Priority:   Routine    Referral Type:   Consultation    Referral Reason:   Specialty Services Required    Number of Visits Requested:   1      All questions were answered. The patient knows to call the clinic with any problems, questions or concerns.     Melissa Feeling, NP 10/20/21   Addendum  I have seen the patient, examined her. I agree with the assessment and and plan and have edited the notes.   46 yo female with multiple medical problems, was referred for anemia management.  She has lab evidence of iron deficiency and B12 deficiency.  She is perimenopausal, her menstrual bleeding is not heavy, we recommended colonoscopy to rule out GI bleeding.  She is not responding well to oral iron, will recommend IV iron.  She has received multiple monthly B12 infection, however serum B12 level is still low, we recommend weekly B12 for 4 injections then monthly. Will monitor her iron and B12 level periodically.  If her anemia does not resolve after adequate B12 and iron supplements, then we will obtain additional anemia work-up.  All questions were answered.  Truitt Merle  MD 10/18/2021

## 2021-10-19 LAB — INTRINSIC FACTOR ANTIBODIES: Intrinsic Factor: 1 AU/mL (ref 0.0–1.1)

## 2021-10-20 ENCOUNTER — Encounter: Payer: Self-pay | Admitting: Nurse Practitioner

## 2021-10-23 ENCOUNTER — Telehealth: Payer: Self-pay

## 2021-10-23 ENCOUNTER — Telehealth: Payer: Self-pay | Admitting: Nurse Practitioner

## 2021-10-23 NOTE — Telephone Encounter (Signed)
Per 10/2 staff message called and spoke to pt about appointments

## 2021-10-23 NOTE — Telephone Encounter (Addendum)
Pt called and informed of information below per Regan Rakers, NP, scheduling message sent for pt appt. No questions at this time.   ----- Message from Alla Feeling, NP sent at 10/23/2021  2:24 PM EDT ----- Following up on this message to the patient and scheduling, please confirm when it has been done.   Thanks, Regan Rakers  ----- Message ----- From: Clarene Essex, Counselor Sent: 10/20/2021   7:54 AM EDT To: Alla Feeling, NP; Merrilee Jansky; #  The ovarian cancer automatically qualifies her and the breast cancer adds to that.  Please put in a referral and we would love to see her.  Thank you! ----- Message ----- From: Alla Feeling, NP Sent: 10/20/2021   7:53 AM EDT To: Clarene Essex, Counselor; Merrilee Jansky; #  Joy or my nurse today,  Please call pt let her know labs continue to show iron deficiency and B12 deficiency anemia. We recommend weekly b12 inj for 4 weeks, then monthly, (she can also take oral) and IV iron weekly for 3 of those weeks. Will check lab in 1, 3, and 6 months and arrange additional transfusions as needed. I put appts in los.  Please refer her to [facility] for IDA work up, the order is in.   Santiago Glad, she has family history of PMG with breast and ovarian cancers, a paternal aunt with breast, and a sister with childhood cancer. She is interested in genetic testing if she qualifies.   Thanks, Regan Rakers

## 2021-10-24 ENCOUNTER — Other Ambulatory Visit (HOSPITAL_BASED_OUTPATIENT_CLINIC_OR_DEPARTMENT_OTHER): Payer: Self-pay | Admitting: Nurse Practitioner

## 2021-10-24 ENCOUNTER — Encounter: Payer: Self-pay | Admitting: Nurse Practitioner

## 2021-10-24 DIAGNOSIS — M0579 Rheumatoid arthritis with rheumatoid factor of multiple sites without organ or systems involvement: Secondary | ICD-10-CM

## 2021-10-24 DIAGNOSIS — M791 Myalgia, unspecified site: Secondary | ICD-10-CM | POA: Diagnosis not present

## 2021-10-24 DIAGNOSIS — F900 Attention-deficit hyperactivity disorder, predominantly inattentive type: Secondary | ICD-10-CM

## 2021-10-24 DIAGNOSIS — D508 Other iron deficiency anemias: Secondary | ICD-10-CM | POA: Diagnosis not present

## 2021-10-24 DIAGNOSIS — H04123 Dry eye syndrome of bilateral lacrimal glands: Secondary | ICD-10-CM | POA: Diagnosis not present

## 2021-10-24 DIAGNOSIS — Z6841 Body Mass Index (BMI) 40.0 and over, adult: Secondary | ICD-10-CM | POA: Diagnosis not present

## 2021-10-25 ENCOUNTER — Inpatient Hospital Stay: Payer: 59 | Attending: Internal Medicine

## 2021-10-25 ENCOUNTER — Other Ambulatory Visit: Payer: Self-pay

## 2021-10-25 VITALS — BP 134/77 | HR 71 | Temp 97.7°F | Resp 17

## 2021-10-25 DIAGNOSIS — E538 Deficiency of other specified B group vitamins: Secondary | ICD-10-CM | POA: Insufficient documentation

## 2021-10-25 DIAGNOSIS — D509 Iron deficiency anemia, unspecified: Secondary | ICD-10-CM | POA: Diagnosis not present

## 2021-10-25 MED ORDER — LORATADINE 10 MG PO TABS
10.0000 mg | ORAL_TABLET | Freq: Every day | ORAL | Status: DC
  Administered 2021-10-25: 10 mg via ORAL
  Filled 2021-10-25: qty 1

## 2021-10-25 MED ORDER — SODIUM CHLORIDE 0.9 % IV SOLN: Freq: Once | INTRAVENOUS | Status: AC

## 2021-10-25 MED ORDER — SODIUM CHLORIDE 0.9 % IV SOLN
400.0000 mg | Freq: Once | INTRAVENOUS | Status: AC
  Administered 2021-10-25: 400 mg via INTRAVENOUS
  Filled 2021-10-25: qty 20

## 2021-10-25 MED ORDER — CYANOCOBALAMIN 1000 MCG/ML IJ SOLN
1000.0000 ug | Freq: Once | INTRAMUSCULAR | Status: AC
  Administered 2021-10-25: 1000 ug via INTRAMUSCULAR
  Filled 2021-10-25: qty 1

## 2021-10-25 NOTE — Patient Instructions (Signed)

## 2021-10-30 ENCOUNTER — Telehealth: Payer: Self-pay | Admitting: Internal Medicine

## 2021-10-30 ENCOUNTER — Telehealth (HOSPITAL_BASED_OUTPATIENT_CLINIC_OR_DEPARTMENT_OTHER): Payer: Self-pay

## 2021-10-30 NOTE — Telephone Encounter (Signed)
Patient needs peer to peer review for [facility]. This can be called to [phone] or faxed to [phone] @ cover my meds. Blocked your schedule for this afternoon 40 mins to get this done.  Code: [id number]

## 2021-10-30 NOTE — Telephone Encounter (Signed)
Per 10/9 secure chat with charge called and left pt for pt about appointment changes due to infusion

## 2021-11-01 ENCOUNTER — Ambulatory Visit: Payer: 59

## 2021-11-02 ENCOUNTER — Other Ambulatory Visit: Payer: Self-pay

## 2021-11-02 ENCOUNTER — Inpatient Hospital Stay: Payer: 59

## 2021-11-02 VITALS — BP 122/69 | HR 70 | Temp 98.2°F | Resp 17

## 2021-11-02 DIAGNOSIS — D509 Iron deficiency anemia, unspecified: Secondary | ICD-10-CM | POA: Diagnosis not present

## 2021-11-02 DIAGNOSIS — E538 Deficiency of other specified B group vitamins: Secondary | ICD-10-CM

## 2021-11-02 MED ORDER — LORATADINE 10 MG PO TABS
10.0000 mg | ORAL_TABLET | Freq: Once | ORAL | Status: AC
  Administered 2021-11-02: 10 mg via ORAL
  Filled 2021-11-02: qty 1

## 2021-11-02 MED ORDER — SODIUM CHLORIDE 0.9 % IV SOLN
400.0000 mg | Freq: Once | INTRAVENOUS | Status: AC
  Administered 2021-11-02: 400 mg via INTRAVENOUS
  Filled 2021-11-02: qty 20

## 2021-11-02 MED ORDER — CYANOCOBALAMIN 1000 MCG/ML IJ SOLN
1000.0000 ug | Freq: Once | INTRAMUSCULAR | Status: AC
  Administered 2021-11-02: 1000 ug via INTRAMUSCULAR
  Filled 2021-11-02: qty 1

## 2021-11-02 MED ORDER — SODIUM CHLORIDE 0.9 % IV SOLN: Freq: Once | INTRAVENOUS | Status: AC

## 2021-11-02 NOTE — Patient Instructions (Signed)

## 2021-11-08 ENCOUNTER — Other Ambulatory Visit: Payer: Self-pay

## 2021-11-08 ENCOUNTER — Inpatient Hospital Stay: Payer: 59

## 2021-11-08 VITALS — BP 147/96 | HR 68 | Temp 97.8°F | Resp 18

## 2021-11-08 DIAGNOSIS — D509 Iron deficiency anemia, unspecified: Secondary | ICD-10-CM | POA: Diagnosis not present

## 2021-11-08 DIAGNOSIS — E538 Deficiency of other specified B group vitamins: Secondary | ICD-10-CM | POA: Diagnosis not present

## 2021-11-08 MED ORDER — SODIUM CHLORIDE 0.9 % IV SOLN: Freq: Once | INTRAVENOUS | Status: AC

## 2021-11-08 MED ORDER — CYANOCOBALAMIN 1000 MCG/ML IJ SOLN
1000.0000 ug | Freq: Once | INTRAMUSCULAR | Status: AC
  Administered 2021-11-08: 1000 ug via INTRAMUSCULAR
  Filled 2021-11-08: qty 1

## 2021-11-08 MED ORDER — SODIUM CHLORIDE 0.9 % IV SOLN
400.0000 mg | Freq: Once | INTRAVENOUS | Status: AC
  Administered 2021-11-08: 400 mg via INTRAVENOUS
  Filled 2021-11-08: qty 20

## 2021-11-08 MED ORDER — LORATADINE 10 MG PO TABS
10.0000 mg | ORAL_TABLET | Freq: Every day | ORAL | Status: DC
  Administered 2021-11-08: 10 mg via ORAL
  Filled 2021-11-08: qty 1

## 2021-11-08 NOTE — Patient Instructions (Signed)

## 2021-11-09 ENCOUNTER — Other Ambulatory Visit (HOSPITAL_BASED_OUTPATIENT_CLINIC_OR_DEPARTMENT_OTHER): Payer: Self-pay | Admitting: Nurse Practitioner

## 2021-11-09 ENCOUNTER — Encounter: Payer: Self-pay | Admitting: *Deleted

## 2021-11-09 DIAGNOSIS — J4542 Moderate persistent asthma with status asthmaticus: Secondary | ICD-10-CM

## 2021-11-10 DIAGNOSIS — M0579 Rheumatoid arthritis with rheumatoid factor of multiple sites without organ or systems involvement: Secondary | ICD-10-CM | POA: Diagnosis not present

## 2021-11-15 ENCOUNTER — Ambulatory Visit: Payer: 59

## 2021-11-15 ENCOUNTER — Other Ambulatory Visit: Payer: 59

## 2021-11-15 ENCOUNTER — Inpatient Hospital Stay: Payer: 59

## 2021-11-17 NOTE — Telephone Encounter (Signed)
Patient calling to check status on peer to peer for [facility]. Please advise

## 2021-11-20 ENCOUNTER — Encounter (HOSPITAL_BASED_OUTPATIENT_CLINIC_OR_DEPARTMENT_OTHER): Payer: Self-pay | Admitting: Nurse Practitioner

## 2021-11-20 NOTE — Telephone Encounter (Signed)
Melissa Massey can you please help me, I have blocked her schedule for the peer to peer. Sent her the information numerous times. Please advise!

## 2021-11-28 ENCOUNTER — Telehealth: Payer: Self-pay | Admitting: Gastroenterology

## 2021-11-28 ENCOUNTER — Other Ambulatory Visit (HOSPITAL_BASED_OUTPATIENT_CLINIC_OR_DEPARTMENT_OTHER): Payer: Self-pay | Admitting: Nurse Practitioner

## 2021-11-28 ENCOUNTER — Telehealth: Payer: Self-pay | Admitting: Nurse Practitioner

## 2021-11-28 DIAGNOSIS — F900 Attention-deficit hyperactivity disorder, predominantly inattentive type: Secondary | ICD-10-CM

## 2021-11-28 DIAGNOSIS — J4542 Moderate persistent asthma with status asthmaticus: Secondary | ICD-10-CM

## 2021-11-28 DIAGNOSIS — G4733 Obstructive sleep apnea (adult) (pediatric): Secondary | ICD-10-CM

## 2021-11-28 DIAGNOSIS — G4710 Hypersomnia, unspecified: Secondary | ICD-10-CM

## 2021-11-28 MED ORDER — AMPHETAMINE-DEXTROAMPHET ER 20 MG PO CP24: 40.0000 mg | ORAL_CAPSULE | Freq: Every day | ORAL | 0 refills | Status: DC

## 2021-11-28 NOTE — Telephone Encounter (Signed)
Called patient per 11/6 in basket. Rescheduled missed appointments.

## 2021-11-28 NOTE — Telephone Encounter (Addendum)
Hi Dr Candis Schatz,  We received a referral for this patient for Iron deficiency. Patient has a history with digestive health back in 2022. She would like to be seen her at our office because her insurance is in network with [place].Please advise on scheduling.    Thank you

## 2021-11-29 NOTE — Telephone Encounter (Addendum)
Hi Dr. Tomasa Rand,    I called patient to request EGD and Colonoscopy Reports/Pathology reports but patient states she only had a consultation, but never had procedures due to her insurance being changed and [facility] is not in network. We received the consultation records. I will send them for review. Please advise on scheduling.     Thank you

## 2021-11-29 NOTE — Progress Notes (Deleted)
HPI: FU CHF and hypertension. High-resolution chest CT November 2022 showed nonspecific air trapping, bronchiectasis and nonspecific small nodules. Echo 6/23 showed normal LV function, grade 1 DD, mild LAE. CTA 6/23 showed small lung nodules; Ca score 199 (99 percentile) and mild plaque in the LAD. Since last seen,   Current Outpatient Medications  Medication Sig Dispense Refill   albuterol (PROVENTIL) (2.5 MG/3ML) 0.083% nebulizer solution Take 3 mLs (2.5 mg total) by nebulization every 4 (four) hours as needed for wheezing or shortness of breath (please include nebulizer machine, hoses, and mask if needed.). 185 mL 6   albuterol (VENTOLIN HFA) 108 (90 Base) MCG/ACT inhaler INHALE 2 PUFFS BY MOUTH EVERY 6 HOURS AS NEEDED FOR WHEEZING AND FOR SHORTNESS OF BREATH 9 g 0   amphetamine-dextroamphetamine (ADDERALL XR) 20 MG 24 hr capsule Take 2 capsules (40 mg total) by mouth daily. 60 capsule 0   BREO ELLIPTA 200-25 MCG/ACT AEPB Inhale 1 puff into the lungs daily.     busPIRone (BUSPAR) 7.5 MG tablet TAKE 1 TABLET BY MOUTH THREE TIMES DAILY 90 tablet 0   celecoxib (CELEBREX) 200 MG capsule Take 1 capsule by mouth twice daily 60 capsule 0   cyclobenzaprine (FLEXERIL) 5 MG tablet Take 5 mg by mouth at bedtime as needed for muscle spasms.     esomeprazole (NEXIUM) 40 MG capsule Take 40 mg by mouth 2 (two) times daily.     etanercept (ENBREL SURECLICK) 50 MG/ML injection 50mg      glipiZIDE (GLUCOTROL) 5 MG tablet Take 5 mg by mouth 2 (two) times daily before a meal.     hydrochlorothiazide (HYDRODIURIL) 25 MG tablet Take 25 mg by mouth daily.     Iron, Ferrous Sulfate, 325 (65 Fe) MG TABS Take 325 mg by mouth daily. 90 tablet 1   levothyroxine (SYNTHROID) 125 MCG tablet Take 250 mcg by mouth daily.     liothyronine (CYTOMEL) 5 MCG tablet Take 5 mcg by mouth 2 (two) times daily.     losartan (COZAAR) 100 MG tablet Take 100 mg by mouth daily.     metFORMIN (GLUCOPHAGE-XR) 500 MG 24 hr tablet TAKE 2  TABLETS BY MOUTH IN THE MORNING AND 2 AT BEDTIME 120 tablet 0   metoprolol tartrate (LOPRESSOR) 100 MG tablet Take 2 hours prior to ct scan 1 tablet 0   montelukast (SINGULAIR) 10 MG tablet Take 10 mg by mouth daily.     ondansetron (ZOFRAN) 8 MG tablet Take 1 tablet (8 mg total) by mouth every 8 (eight) hours as needed for nausea or vomiting. 20 tablet 2   predniSONE (DELTASONE) 10 MG tablet Take 10 mg by mouth 2 (two) times daily.     promethazine (PHENERGAN) 25 MG tablet Take 1 tablet (25 mg total) by mouth every 8 (eight) hours as needed for nausea or vomiting. 20 tablet 0   rosuvastatin (CRESTOR) 40 MG tablet Take 1 tablet (40 mg total) by mouth daily. 90 tablet 3   tirzepatide (MOUNJARO) 2.5 MG/0.5ML Pen Inject 2.5 mg into the skin once a week. 2 mL 0   venlafaxine XR (EFFEXOR-XR) 150 MG 24 hr capsule TAKE 1 CAPSULE BY MOUTH ONCE DAILY WITH BREAKFAST 30 capsule 0   Vitamin D, Ergocalciferol, (DRISDOL) 1.25 MG (50000 UNIT) CAPS capsule Take 50,000 Units by mouth every Sunday.     Current Facility-Administered Medications  Medication Dose Route Frequency Provider Last Rate Last Admin   cyanocobalamin ((VITAMIN B-12)) injection 1,000 mcg  1,000 mcg Intramuscular  Q30 days Early, Coralee Pesa, NP   1,000 mcg at 06/13/21 1630   cyanocobalamin ((VITAMIN B-12)) injection 1,000 mcg  1,000 mcg Intramuscular Q30 days Early, Coralee Pesa, NP   1,000 mcg at 08/09/21 1212   cyanocobalamin (VITAMIN B12) injection 1,000 mcg  1,000 mcg Intramuscular Q30 days Early, Coralee Pesa, NP   1,000 mcg at 09/13/21 1345     Past Medical History:  Diagnosis Date   Arthritis    Asthma    CHF (congestive heart failure) ([place])    Diabetes mellitus without complication (HCC)    DVT (deep venous thrombosis) (HCC)    Graves disease    Hyperlipidemia    Hypertension    PCOS (polycystic ovarian syndrome)    Rheumatoid arthritis ([place])     Past Surgical History:  Procedure Laterality Date   CESAREAN SECTION     x2   LUMBAR FUSION      TUBAL LIGATION      Social History   Socioeconomic History   Marital status: Single    Spouse name: Not on file   Number of children: 4   Years of education: Not on file   Highest education level: Not on file  Occupational History   Not on file  Tobacco Use   Smoking status: Former    Packs/day: 1.00    Types: Cigarettes   Smokeless tobacco: Never  Vaping Use   Vaping Use: Every day   Substances: Nicotine  Substance and Sexual Activity   Alcohol use: No   Drug use: No   Sexual activity: Yes    Birth control/protection: Surgical  Other Topics Concern   Not on file  Social History Narrative   Not on file   Social Determinants of Health   Financial Resource Strain: Not on file  Food Insecurity: Not on file  Transportation Needs: Not on file  Physical Activity: Not on file  Stress: Not on file  Social Connections: Not on file  Intimate Partner Violence: Not on file    Family History  Problem Relation Age of Onset   Heart attack Mother    Stroke Mother    Diabetes Mother    Hypertension Father    Cancer Sister 1       neuroblastoma   Cancer Paternal Aunt 66       breast   Cancer Paternal Grandmother        breast and ovarian    ROS: no fevers or chills, productive cough, hemoptysis, dysphasia, odynophagia, melena, hematochezia, dysuria, hematuria, rash, seizure activity, orthopnea, PND, pedal edema, claudication. Remaining systems are negative.  Physical Exam: Well-developed well-nourished in no acute distress.  Skin is warm and dry.  HEENT is normal.  Neck is supple.  Chest is clear to auscultation with normal expansion.  Cardiovascular exam is regular rate and rhythm.  Abdominal exam nontender or distended. No masses palpated. Extremities show no edema. neuro grossly intact  ECG- personally reviewed  A/P  1 dyspnea-CTA shows mild CAD and LV function normal on echo.    2 coronary calcification/mild CAD-will treat with ASA and statin.   3  diabetes mellitus-managed by primary care.   4 hypertension-continue present blood pressure medications.   5 hyperlipidemia-LDL is elevated.  She also has diabetes mellitus. Given elevated Ca score, increase crestor to 40 mg daily; lipids and liver 8 weeks.  6 pulmonary nodules-FU noncontrast chest CT 6/24.  Kirk Ruths, MD

## 2021-11-30 ENCOUNTER — Inpatient Hospital Stay: Payer: 59 | Attending: Hematology

## 2021-11-30 ENCOUNTER — Inpatient Hospital Stay: Payer: 59

## 2021-11-30 ENCOUNTER — Encounter: Payer: Self-pay | Admitting: Gastroenterology

## 2021-11-30 ENCOUNTER — Other Ambulatory Visit: Payer: Self-pay

## 2021-11-30 DIAGNOSIS — E538 Deficiency of other specified B group vitamins: Secondary | ICD-10-CM | POA: Diagnosis not present

## 2021-11-30 DIAGNOSIS — D509 Iron deficiency anemia, unspecified: Secondary | ICD-10-CM | POA: Insufficient documentation

## 2021-11-30 DIAGNOSIS — E611 Iron deficiency: Secondary | ICD-10-CM

## 2021-11-30 LAB — CBC WITH DIFFERENTIAL (CANCER CENTER ONLY)
Abs Immature Granulocytes: 0.01 10*3/uL (ref 0.00–0.07)
Basophils Absolute: 0 10*3/uL (ref 0.0–0.1)
Basophils Relative: 0 %
Eosinophils Absolute: 0 10*3/uL (ref 0.0–0.5)
Eosinophils Relative: 0 %
HCT: 36.2 % (ref 36.0–46.0)
Hemoglobin: 11.9 g/dL — ABNORMAL LOW (ref 12.0–15.0)
Immature Granulocytes: 0 %
Lymphocytes Relative: 36 %
Lymphs Abs: 1.8 10*3/uL (ref 0.7–4.0)
MCH: 27.5 pg (ref 26.0–34.0)
MCHC: 32.9 g/dL (ref 30.0–36.0)
MCV: 83.8 fL (ref 80.0–100.0)
Monocytes Absolute: 0.4 10*3/uL (ref 0.1–1.0)
Monocytes Relative: 7 %
Neutro Abs: 2.9 10*3/uL (ref 1.7–7.7)
Neutrophils Relative %: 57 %
Platelet Count: 251 10*3/uL (ref 150–400)
RBC: 4.32 MIL/uL (ref 3.87–5.11)
RDW: 20.1 % — ABNORMAL HIGH (ref 11.5–15.5)
WBC Count: 5.1 10*3/uL (ref 4.0–10.5)
nRBC: 0 % (ref 0.0–0.2)

## 2021-11-30 LAB — VITAMIN B12: Vitamin B-12: 325 pg/mL (ref 180–914)

## 2021-11-30 LAB — IRON AND IRON BINDING CAPACITY (CC-WL,HP ONLY)
Iron: 66 ug/dL (ref 28–170)
Saturation Ratios: 19 % (ref 10.4–31.8)
TIBC: 350 ug/dL (ref 250–450)
UIBC: 284 ug/dL (ref 148–442)

## 2021-11-30 LAB — FERRITIN: Ferritin: 30 ng/mL (ref 11–307)

## 2021-11-30 MED ORDER — CYANOCOBALAMIN 1000 MCG/ML IJ SOLN
1000.0000 ug | Freq: Once | INTRAMUSCULAR | Status: AC
  Administered 2021-11-30: 1000 ug via INTRAMUSCULAR
  Filled 2021-11-30: qty 1

## 2021-11-30 NOTE — Telephone Encounter (Signed)
Patient is scheduled for an Office visit on 12-18

## 2021-12-04 ENCOUNTER — Encounter: Payer: Self-pay | Admitting: Nurse Practitioner

## 2021-12-08 DIAGNOSIS — M0579 Rheumatoid arthritis with rheumatoid factor of multiple sites without organ or systems involvement: Secondary | ICD-10-CM | POA: Diagnosis not present

## 2021-12-12 ENCOUNTER — Inpatient Hospital Stay: Payer: 59

## 2021-12-12 ENCOUNTER — Other Ambulatory Visit: Payer: 59

## 2021-12-13 ENCOUNTER — Ambulatory Visit: Payer: 59 | Admitting: Cardiology

## 2021-12-18 ENCOUNTER — Inpatient Hospital Stay: Payer: 59

## 2021-12-18 ENCOUNTER — Other Ambulatory Visit: Payer: Self-pay | Admitting: Genetic Counselor

## 2021-12-18 ENCOUNTER — Encounter: Payer: Self-pay | Admitting: Genetic Counselor

## 2021-12-18 ENCOUNTER — Other Ambulatory Visit: Payer: Self-pay

## 2021-12-18 ENCOUNTER — Inpatient Hospital Stay (HOSPITAL_BASED_OUTPATIENT_CLINIC_OR_DEPARTMENT_OTHER): Payer: 59 | Admitting: Genetic Counselor

## 2021-12-18 ENCOUNTER — Other Ambulatory Visit (HOSPITAL_BASED_OUTPATIENT_CLINIC_OR_DEPARTMENT_OTHER): Payer: Self-pay | Admitting: Nurse Practitioner

## 2021-12-18 DIAGNOSIS — M0579 Rheumatoid arthritis with rheumatoid factor of multiple sites without organ or systems involvement: Secondary | ICD-10-CM

## 2021-12-18 DIAGNOSIS — E611 Iron deficiency: Secondary | ICD-10-CM

## 2021-12-18 DIAGNOSIS — E1165 Type 2 diabetes mellitus with hyperglycemia: Secondary | ICD-10-CM

## 2021-12-18 DIAGNOSIS — E538 Deficiency of other specified B group vitamins: Secondary | ICD-10-CM | POA: Diagnosis not present

## 2021-12-18 DIAGNOSIS — Z8041 Family history of malignant neoplasm of ovary: Secondary | ICD-10-CM

## 2021-12-18 DIAGNOSIS — Z803 Family history of malignant neoplasm of breast: Secondary | ICD-10-CM | POA: Diagnosis not present

## 2021-12-18 DIAGNOSIS — F900 Attention-deficit hyperactivity disorder, predominantly inattentive type: Secondary | ICD-10-CM

## 2021-12-18 DIAGNOSIS — D509 Iron deficiency anemia, unspecified: Secondary | ICD-10-CM | POA: Diagnosis not present

## 2021-12-18 LAB — CBC WITH DIFFERENTIAL (CANCER CENTER ONLY)
Abs Immature Granulocytes: 0.01 10*3/uL (ref 0.00–0.07)
Basophils Absolute: 0 10*3/uL (ref 0.0–0.1)
Basophils Relative: 0 %
Eosinophils Absolute: 0 10*3/uL (ref 0.0–0.5)
Eosinophils Relative: 0 %
HCT: 39.7 % (ref 36.0–46.0)
Hemoglobin: 13.2 g/dL (ref 12.0–15.0)
Immature Granulocytes: 0 %
Lymphocytes Relative: 18 %
Lymphs Abs: 1.5 10*3/uL (ref 0.7–4.0)
MCH: 28 pg (ref 26.0–34.0)
MCHC: 33.2 g/dL (ref 30.0–36.0)
MCV: 84.1 fL (ref 80.0–100.0)
Monocytes Absolute: 0.5 10*3/uL (ref 0.1–1.0)
Monocytes Relative: 6 %
Neutro Abs: 6.4 10*3/uL (ref 1.7–7.7)
Neutrophils Relative %: 76 %
Platelet Count: 302 10*3/uL (ref 150–400)
RBC: 4.72 MIL/uL (ref 3.87–5.11)
RDW: 18.6 % — ABNORMAL HIGH (ref 11.5–15.5)
WBC Count: 8.4 10*3/uL (ref 4.0–10.5)
nRBC: 0 % (ref 0.0–0.2)

## 2021-12-18 LAB — IRON AND IRON BINDING CAPACITY (CC-WL,HP ONLY)
Iron: 56 ug/dL (ref 28–170)
Saturation Ratios: 13 % (ref 10.4–31.8)
TIBC: 420 ug/dL (ref 250–450)
UIBC: 364 ug/dL (ref 148–442)

## 2021-12-18 LAB — VITAMIN B12: Vitamin B-12: 262 pg/mL (ref 180–914)

## 2021-12-18 LAB — GENETIC SCREENING ORDER

## 2021-12-18 LAB — FERRITIN: Ferritin: 32 ng/mL (ref 11–307)

## 2021-12-18 NOTE — Progress Notes (Signed)
REFERRING PROVIDER: Alla Feeling, NP [place],  [place] [address]  PRIMARY PROVIDER:  Orma Render, NP  PRIMARY REASON FOR VISIT:  1. Family history of ovarian cancer   2. Family history of breast cancer     HISTORY OF PRESENT ILLNESS:   Melissa Massey, a 46 y.o. female, was seen for a [facility] cancer genetics consultation at the request of Melissa Rue, NP due to a family history of breast and ovarian cancer.  Melissa Massey presents to clinic today to discuss the possibility of a hereditary predisposition to cancer, to discuss genetic testing, and to further clarify her future cancer risks, as well as potential cancer risks for family members.    Melissa Massey is a 46 y.o. female with no personal history of cancer.    CANCER HISTORY:  Oncology History   No history exists.    RISK FACTORS:  Mammogram within the last year: yes; most recent 08/2021 Number of breast biopsies: 0. Colonoscopy: no; not examined; appt with [facility] scheduled Hysterectomy: no.  Ovaries intact: yes.  Menarche was at age 60.  First live birth at age 65.  OCP use for approximately 10 years.  HRT use: 0 years. Dermatology screening: no  Past Medical History:  Diagnosis Date   Arthritis    Asthma    CHF (congestive heart failure) ([place])    Diabetes mellitus without complication (HCC)    DVT (deep venous thrombosis) (HCC)    Graves disease    Hyperlipidemia    Hypertension    PCOS (polycystic ovarian syndrome)    Rheumatoid arthritis ([place])     Past Surgical History:  Procedure Laterality Date   CESAREAN SECTION     x2   LUMBAR FUSION     TUBAL LIGATION        FAMILY HISTORY:  We obtained a detailed, 4-generation family history.  Significant diagnoses are listed below: Family History  Problem Relation Age of Onset   Cancer - Other Sister 1       neuroblastoma   Breast cancer Paternal Aunt        dx 48s   Lung cancer Maternal Grandfather        d. 105s   Breast cancer  Paternal Grandmother    Ovarian cancer Paternal Grandmother    Uterine cancer Other        MGM's sister; dx before 16    Melissa Massey is unaware of previous family history of genetic testing for hereditary cancer risks. She has limited information about her paternal family history and is not in contact with paternal relatives.  Her maternal aunt with ovarian cancer was unavailable for genetic testing at this time.  There is no reported Ashkenazi Jewish ancestry. There is no known consanguinity.  GENETIC COUNSELING ASSESSMENT: [name] is a 46 y.o. female with a family history which is somewhat suggestive of a hereditary cancer syndrome and predisposition to cancer given the presence of breast and ovarian cancer. We, therefore, discussed and recommended the following at today's visit.   DISCUSSION: We discussed that 5 - 10% of cancer is hereditary, with most cases of hereditary breast and ovarian cancer associated with mutations in BRCA1/2.  There are other genes that can be associated with hereditary breast and ovarian cancer syndromes..  We discussed that testing is beneficial for several reasons, including knowing about other cancer risks, identifying potential screening and risk-reduction options that may be appropriate, and to understanding if other family members  could be at risk for cancer and allowing them to undergo genetic testing.  We reviewed the characteristics, features and inheritance patterns of hereditary cancer syndromes. We also discussed genetic testing, including the appropriate family members to test, the process of testing, insurance coverage and turn-around-time for results. We discussed the implications of a negative, positive, and variant of uncertain significant result. We discussed that negative results would be uninformative given that Melissa Massey does not have a personal history of cancer. We recommended Melissa Massey pursue genetic testing for a panel that contains genes  associated with breast, ovarian, neuroblastoma, and other cancers given her limited information about her paternal family.  The CancerNext-Expanded gene panel offered by [facility] and includes sequencing, rearrangement, and RNA analysis for the following 77 genes: AIP, ALK, APC, ATM, AXIN2, BAP1, BARD1, BLM, BMPR1A, BRCA1, BRCA2, BRIP1, CDC73, CDH1, CDK4, CDKN1B, CDKN2A, CHEK2, CTNNA1, DICER1, FANCC, FH, FLCN, GALNT12, KIF1B, LZTR1, MAX, MEN1, MET, MLH1, MSH2, MSH3, MSH6, MUTYH, NBN, NF1, NF2, NTHL1, PALB2, PHOX2B, PMS2, POT1, PRKAR1A, PTCH1, PTEN, RAD51C, RAD51D, RB1, RECQL, RET, SDHA, SDHAF2, SDHB, SDHC, SDHD, SMAD4, SMARCA4, SMARCB1, SMARCE1, STK11, SUFU, TMEM127, TP53, TSC1, TSC2, VHL and XRCC2 (sequencing and deletion/duplication); EGFR, EGLN1, HOXB13, KIT, MITF, PDGFRA, POLD1, and POLE (sequencing only); EPCAM and GREM1 (deletion/duplication only).   Based on Melissa Massey's family history of breast and ovarian cancercancer, she meets medical criteria for genetic testing. Despite that she meets criteria, she may still have an out of pocket cost. We discussed that if her out of pocket cost for testing is over $100, the laboratory should contact them to discuss self-pay options and/or patient pay assistance programs.   We discussed the Genetic Information Non-Discrimination Act (GINA) of 2008, which helps protect individuals against genetic discrimination based on their genetic test results.  It impacts both health insurance and employment.  With health insurance, it protects against genetic test results being used for increased premiums or policy termination. For employment, it protects against hiring, firing and promoting decisions based on genetic test results.  GINA does not apply to those in the [employer], those who work for companies with less than 15 employees, and new life insurance or long-term disability insurance policies.  Health status due to a cancer diagnosis is not protected under  GINA.  PLAN: After considering the risks, benefits, and limitations, Melissa Massey provided informed consent to pursue genetic testing and the blood sample was sent to [facility] for analysis of the CancerNext-Expanded +RNAinsight Panel. Results should be available within approximately 3 weeks' time, at which point they will be disclosed by telephone to Melissa Massey, as will any additional recommendations warranted by these results. Melissa Massey will receive a summary of her genetic counseling visit and a copy of her results once available. This information will also be available in Epic.    Melissa Massey questions were answered to her satisfaction today. Our contact information was provided should additional questions or concerns arise. Thank you for the referral and allowing [place] to share in the care of your patient.   Alexxis Mackert M. Joette Catching, [place], [facility] Genetic Counselor [email or web address] (P) [phone]   The patient was seen for a total of 34 minutes in face-to-face genetic counseling.  The patient was seen alone.  Drs. Lindi Adie and/or Burr Medico were available to discuss this case as needed.  _______________________________________________________________________ For Office Staff:  Number of people involved in session: 1 Was an Intern/ student involved with case: no

## 2021-12-21 ENCOUNTER — Encounter: Payer: Self-pay | Admitting: Nurse Practitioner

## 2021-12-21 DIAGNOSIS — E1165 Type 2 diabetes mellitus with hyperglycemia: Secondary | ICD-10-CM

## 2021-12-21 DIAGNOSIS — F900 Attention-deficit hyperactivity disorder, predominantly inattentive type: Secondary | ICD-10-CM

## 2021-12-21 DIAGNOSIS — M0579 Rheumatoid arthritis with rheumatoid factor of multiple sites without organ or systems involvement: Secondary | ICD-10-CM

## 2021-12-22 MED ORDER — METFORMIN HCL ER 500 MG PO TB24: ORAL_TABLET | ORAL | 5 refills | Status: DC

## 2021-12-22 MED ORDER — CELECOXIB 200 MG PO CAPS: 200.0000 mg | ORAL_CAPSULE | Freq: Two times a day (BID) | ORAL | 5 refills | Status: DC

## 2021-12-22 MED ORDER — VENLAFAXINE HCL ER 150 MG PO CP24: 150.0000 mg | ORAL_CAPSULE | Freq: Every day | ORAL | 5 refills | Status: DC

## 2021-12-23 DIAGNOSIS — H5711 Ocular pain, right eye: Secondary | ICD-10-CM | POA: Diagnosis not present

## 2021-12-23 DIAGNOSIS — H15001 Unspecified scleritis, right eye: Secondary | ICD-10-CM | POA: Diagnosis not present

## 2021-12-24 DIAGNOSIS — J45909 Unspecified asthma, uncomplicated: Secondary | ICD-10-CM | POA: Insufficient documentation

## 2021-12-24 DIAGNOSIS — K5790 Diverticulosis of intestine, part unspecified, without perforation or abscess without bleeding: Secondary | ICD-10-CM | POA: Insufficient documentation

## 2021-12-24 DIAGNOSIS — E063 Autoimmune thyroiditis: Secondary | ICD-10-CM | POA: Insufficient documentation

## 2021-12-25 DIAGNOSIS — H15001 Unspecified scleritis, right eye: Secondary | ICD-10-CM | POA: Diagnosis not present

## 2022-01-08 ENCOUNTER — Ambulatory Visit (INDEPENDENT_AMBULATORY_CARE_PROVIDER_SITE_OTHER): Payer: 59 | Admitting: Gastroenterology

## 2022-01-08 ENCOUNTER — Encounter: Payer: Self-pay | Admitting: Gastroenterology

## 2022-01-08 ENCOUNTER — Encounter: Payer: Self-pay | Admitting: Nurse Practitioner

## 2022-01-08 VITALS — BP 138/80 | HR 76 | Ht 66.0 in | Wt 282.2 lb

## 2022-01-08 DIAGNOSIS — K760 Fatty (change of) liver, not elsewhere classified: Secondary | ICD-10-CM

## 2022-01-08 DIAGNOSIS — R1319 Other dysphagia: Secondary | ICD-10-CM | POA: Diagnosis not present

## 2022-01-08 DIAGNOSIS — K219 Gastro-esophageal reflux disease without esophagitis: Secondary | ICD-10-CM

## 2022-01-08 DIAGNOSIS — D509 Iron deficiency anemia, unspecified: Secondary | ICD-10-CM | POA: Diagnosis not present

## 2022-01-08 DIAGNOSIS — K5900 Constipation, unspecified: Secondary | ICD-10-CM | POA: Diagnosis not present

## 2022-01-08 MED ORDER — NA SULFATE-K SULFATE-MG SULF 17.5-3.13-1.6 GM/177ML PO SOLN: 1.0000 | Freq: Once | ORAL | 0 refills | Status: AC

## 2022-01-08 NOTE — Progress Notes (Signed)
HPI : Melissa Massey is a very pleasant 46 year old female with a history of rheumatoid arthritis, diabetes, PCOS, asthma and history of diverticulitis who is referred to [place] by Georgeanne Nim, NP for further evaluation of iron deficiency anemia and dysphagia.  Patient states that she was found to have iron deficiency anemia and B12 deficiency in May of this year.  Hemoglobin May 9 was 9.8, down from 12.8 in 2015.  MCV 80.  B12 153, iron not checked at that time.  Iron panel September 13, 2021 with iron saturation 6%, serum iron 24, TIBC 381 and ferritin 9.  Intrinsic factor antibodies undetectable.  She was started on oral iron, which made her constipated, but otherwise she tolerated well, but her anemia did not improve.  She therefore underwent iron infusions which did help her iron deficiency.  She does still menstruate, but denies heavy menses or any change in her menstrual blood flow recently. Following IV iron infusions, her ferritin was 32, saturation 13%, TIBC 420, and her hemoglobin has improved to 13.  The patient has longstanding GERD symptoms going back at least 5 years.  Symptoms consist of heartburn and acid regurgitation.  She also has episodes of ingested food going back up passively.  She has been taking esomeprazole for many years and this controls her symptoms well.  She is currently taking it twice a day. In the past few months she has started having sensation of food getting stuck in her chest when she swallows.  She has to have a beverage available to wash food down.  She denies any episodes of having to forcefully vomit up food that is stuck in her esophagus.  The symptoms more noticeable with meats.  No liquid dysphagia.  No weight loss.  She also has chronic constipation, which she manages with diet.  Typically, if she does not eat enough fiber, she will have infrequent bowel movements, but when she improves her fiber intake, her bowel movements improved.  She will sometimes go 3 to 4  days between bowel movements, followed by having multiple bowel movements for the next 1 to 2 days.  She reports a history of recurrent acute uncomplicated diverticulitis.  She estimates having maybe 5 or 6 episodes total.  Her last episode was in May of this year and was confirmed with CT.  Her symptoms are characterized by abrupt onset left lower quadrant pain, which typically quickly responds to antibiotics.  She has not required admission to the hospital for diverticulitis.  Outside of these episodes of diverticulitis, she denies any problems with abdominal pain.  She denies any blood in her stool, either hematochezia or melena. She has never had an upper or lower endoscopy.  She has no family history of colon cancer, but her father was recently diagnosed with a metastatic GIST originate from the stomach, and he is going to be starting Gleevec soon.  She has rheumatoid arthritis with scleritis which has been difficult to control.  She is currently on Simponi which she started about 12 weeks ago when Enbrel was found to be ineffective for her.  She is also on high-dose steroids for scleritis, currently taking 80 mg a day.  Her GERD symptoms typically worsen when she is on steroids.  The patient had been previously evaluated by a [facility] gastroenterologist and had plans for an EGD and colonoscopy, but her insurance changed and she was not able to complete this procedures.  Her previous gastroenterologist also had noted that she had fatty liver  and ordered an elastography which did not show any significant fibrosis or scarring. She had been taking Mounjaro for her diabetes through her primary care, but insurance initially denied it, so she will be starting another medication soon. She tries to follow a Mediterranean diet to help with her diabetes and fatty liver, but admits she has not 100% adherent.    Component Ref Range & Units 2 mo ago  Intrinsic Factor 0.0 - 1.1 AU/mL 1.0   Component Ref Range &  Units 3 wk ago 1 mo ago 2 mo ago 3 mo ago  Ferritin 11 - 307 ng/mL 32 30 CM 5 Low  CM 9 Low     Component Ref Range & Units 3 wk ago (12/18/21) 1 mo ago (11/30/21) 2 mo ago (10/18/21) 7 mo ago (05/30/21) 8 yr ago (07/31/13) 8 yr ago (04/11/13) 9 yr ago (12/22/12)  WBC Count 4.0 - 10.5 K/uL 8.4 5.1 8.6 10.1 R 7.0 9.7 7.0  RBC 3.87 - 5.11 MIL/uL 4.72 4.32 4.16 3.97 R 4.24 4.27 4.06  Hemoglobin 12.0 - 15.0 g/dL 08.6 57.8 Low  46.9 Low  CM 9.8 Low  R 12.8 12.9 11.7 Low   HCT 36.0 - 46.0 % 39.7 36.2 32.0 Low  31.8 Low  R 37.9 38.6 34.4 Low   MCV 80.0 - 100.0 fL 84.1 83.8 76.9 Low  80 R 89.4 R 90.4 R 84.7 R  MCH 26.0 - 34.0 pg 28.0 27.5 24.3 Low  24.7 Low  R 30.2 30.2 28.8  MCHC 30.0 - 36.0 g/dL 62.9 52.8 41.3 24.4 Low  R 33.8 33.4 34.0  RDW 11.5 - 15.5 % 18.6 High  20.1 High  16.4 High  16.9 High  R 13.7 14.6 13.7  Platelet Count 150 - 400 K/uL 302 251 364 389 R 352 365 260    Past Medical History:  Diagnosis Date   Arthritis    Asthma    CHF (congestive heart failure) (HCC)    Diabetes mellitus without complication (HCC)    DVT (deep venous thrombosis) (HCC)    Graves disease    Hyperlipidemia    Hypertension    PCOS (polycystic ovarian syndrome)    Rheumatoid arthritis (HCC)      Past Surgical History:  Procedure Laterality Date   CESAREAN SECTION     x2   LUMBAR FUSION     TUBAL LIGATION     Family History  Problem Relation Age of Onset   Heart attack Mother    Stroke Mother    Diabetes Mother    Hypertension Father    Colon polyps Father    Cancer - Other Sister 1       neuroblastoma   Lung cancer Maternal Grandfather        d. 77s   Breast cancer Paternal Grandmother    Ovarian cancer Paternal Grandmother    Breast cancer Paternal Aunt        dx 44s   Uterine cancer Other        MGM's sister; dx before 48   Social History   Tobacco Use   Smoking status: Former    Packs/day: 1.00    Types: Cigarettes   Smokeless tobacco: Never  Vaping Use   Vaping Use:  Former   Substances: Nicotine  Substance Use Topics   Alcohol use: No   Drug use: No   Current Outpatient Medications  Medication Sig Dispense Refill   albuterol (PROVENTIL) (2.5 MG/3ML) 0.083% nebulizer solution Take 3  mLs (2.5 mg total) by nebulization every 4 (four) hours as needed for wheezing or shortness of breath (please include nebulizer machine, hoses, and mask if needed.). 185 mL 6   albuterol (VENTOLIN HFA) 108 (90 Base) MCG/ACT inhaler INHALE 2 PUFFS BY MOUTH EVERY 6 HOURS AS NEEDED FOR WHEEZING AND FOR SHORTNESS OF BREATH 9 g 0   amphetamine-dextroamphetamine (ADDERALL XR) 20 MG 24 hr capsule Take 2 capsules (40 mg total) by mouth daily. 60 capsule 0   BREO ELLIPTA 200-25 MCG/ACT AEPB Inhale 1 puff into the lungs daily.     celecoxib (CELEBREX) 200 MG capsule Take 1 capsule (200 mg total) by mouth 2 (two) times daily. 60 capsule 5   cyclobenzaprine (FLEXERIL) 5 MG tablet Take 5 mg by mouth at bedtime as needed for muscle spasms.     esomeprazole (NEXIUM) 40 MG capsule Take 40 mg by mouth 2 (two) times daily.     etanercept (ENBREL SURECLICK) 50 MG/ML injection 50mg      glipiZIDE (GLUCOTROL) 5 MG tablet Take 5 mg by mouth 2 (two) times daily before a meal.     hydrochlorothiazide (HYDRODIURIL) 25 MG tablet Take 25 mg by mouth daily.     Iron, Ferrous Sulfate, 325 (65 Fe) MG TABS Take 325 mg by mouth daily. 90 tablet 1   levothyroxine (SYNTHROID) 125 MCG tablet Take 250 mcg by mouth daily.     liothyronine (CYTOMEL) 5 MCG tablet Take 5 mcg by mouth 2 (two) times daily.     losartan (COZAAR) 100 MG tablet Take 100 mg by mouth daily.     metFORMIN (GLUCOPHAGE-XR) 500 MG 24 hr tablet TAKE 2 TABLETS BY MOUTH IN THE MORNING AND 2 AT BEDTIME 120 tablet 5   montelukast (SINGULAIR) 10 MG tablet Take 10 mg by mouth daily.     ondansetron (ZOFRAN) 8 MG tablet Take 1 tablet (8 mg total) by mouth every 8 (eight) hours as needed for nausea or vomiting. 20 tablet 2   predniSONE (DELTASONE) 10  MG tablet Take 10 mg by mouth 2 (two) times daily.     promethazine (PHENERGAN) 25 MG tablet Take 1 tablet (25 mg total) by mouth every 8 (eight) hours as needed for nausea or vomiting. 20 tablet 0   rosuvastatin (CRESTOR) 40 MG tablet Take 1 tablet (40 mg total) by mouth daily. 90 tablet 3   tirzepatide (MOUNJARO) 2.5 MG/0.5ML Pen Inject 2.5 mg into the skin once a week. 2 mL 0   venlafaxine XR (EFFEXOR-XR) 150 MG 24 hr capsule Take 1 capsule (150 mg total) by mouth daily with breakfast. 30 capsule 5   Vitamin D, Ergocalciferol, (DRISDOL) 1.25 MG (50000 UNIT) CAPS capsule Take 50,000 Units by mouth every Sunday.     busPIRone (BUSPAR) 7.5 MG tablet TAKE 1 TABLET BY MOUTH THREE TIMES DAILY (Patient not taking: Reported on 01/08/2022) 90 tablet 0   metoprolol tartrate (LOPRESSOR) 100 MG tablet Take 2 hours prior to ct scan (Patient not taking: Reported on 01/08/2022) 1 tablet 0   predniSONE (DELTASONE) 20 MG tablet Take by mouth.     Current Facility-Administered Medications  Medication Dose Route Frequency Provider Last Rate Last Admin   cyanocobalamin ((VITAMIN B-12)) injection 1,000 mcg  1,000 mcg Intramuscular Q30 days Early, Sung Amabile, NP   1,000 mcg at 06/13/21 1630   cyanocobalamin ((VITAMIN B-12)) injection 1,000 mcg  1,000 mcg Intramuscular Q30 days Early, Sung Amabile, NP   1,000 mcg at 08/09/21 1212   cyanocobalamin (VITAMIN B12)  injection 1,000 mcg  1,000 mcg Intramuscular Q30 days Early, Sung Amabile, NP   1,000 mcg at 09/13/21 1345   No Known Allergies   Review of Systems: All systems reviewed and negative except where noted in HPI.    No results found.  Physical Exam: BP 138/80 (BP Location: Left Arm, Patient Position: Sitting, Cuff Size: Large)   Pulse 76   Ht 5\' 6"  (1.676 m)   Wt 282 lb 4 oz (128 kg)   SpO2 97%   BMI 45.56 kg/m  Constitutional: Pleasant,well-developed, Caucasian female in no acute distress. HEENT: Normocephalic and atraumatic. Conjunctivae are normal. No scleral  icterus. Neck supple.  Cardiovascular: Normal rate, regular rhythm.  Pulmonary/chest: Effort normal and breath sounds normal. No wheezing, rales or rhonchi. Abdominal: Soft, nondistended, nontender. Bowel sounds active throughout. There are no masses palpable. No hepatomegaly. Extremities: no edema Lymphadenopathy: No cervical adenopathy noted. Neurological: Alert and oriented to person place and time. Skin: Skin is warm and dry. No rashes noted. Psychiatric: Normal mood and affect. Behavior is normal.  CBC    Component Value Date/Time   WBC 8.4 12/18/2021 1405   WBC 7.0 07/31/2013 1345   RBC 4.72 12/18/2021 1405   HGB 13.2 12/18/2021 1405   HGB 9.8 (L) 05/30/2021 1343   HCT 39.7 12/18/2021 1405   HCT 31.8 (L) 05/30/2021 1343   PLT 302 12/18/2021 1405   PLT 389 05/30/2021 1343   MCV 84.1 12/18/2021 1405   MCV 80 05/30/2021 1343   MCH 28.0 12/18/2021 1405   MCHC 33.2 12/18/2021 1405   RDW 18.6 (H) 12/18/2021 1405   RDW 16.9 (H) 05/30/2021 1343   LYMPHSABS 1.5 12/18/2021 1405   LYMPHSABS 1.0 05/30/2021 1343   MONOABS 0.5 12/18/2021 1405   EOSABS 0.0 12/18/2021 1405   EOSABS 0.1 05/30/2021 1343   BASOSABS 0.0 12/18/2021 1405   BASOSABS 0.0 05/30/2021 1343    CMP     Component Value Date/Time   NA 138 05/30/2021 1343   K 4.2 05/30/2021 1343   CL 99 05/30/2021 1343   CO2 20 05/30/2021 1343   GLUCOSE 301 (H) 05/30/2021 1343   GLUCOSE 80 07/31/2013 1845   BUN 10 05/30/2021 1343   CREATININE 0.67 05/30/2021 1343   CALCIUM 9.0 05/30/2021 1343   PROT 6.3 05/30/2021 1343   ALBUMIN 4.1 05/30/2021 1343   AST 21 05/30/2021 1343   ALT 35 (H) 05/30/2021 1343   ALKPHOS 91 05/30/2021 1343   BILITOT 0.2 05/30/2021 1343   GFRNONAA 74 (L) 07/31/2013 1845   GFRAA 86 (L) 07/31/2013 1845     ASSESSMENT AND PLAN: 46 year old female with new iron deficiency anemia without overt GI bleeding, improved with IV iron, with chronic history of GERD and recent development of solid  nonprogressive dysphagia without weight loss.  Patient has never had an endoscopic evaluation, and this warrants EGD and colonoscopy for further evaluation of iron deficiency anemia and dysphagia.  Suspect patient's dysphagia is likely related to Schatzki ring versus peptic stricture.  Will plan on dilation if amenable lesion present.  Plan to take biopsies for celiac disease and H. pylori at time of upper endoscopy. Patient is taking high-dose PPI, which may possibly be contributing to malabsorption of dietary iron.  Her GERD symptoms are currently well-controlled with twice daily Nexium.  I recommended she try decreasing her dose to once daily to see if her symptoms are able to be controlled on the lower dose. With regards to her chronic constipation, recommended she start  taking a daily fiber supplement such as Metamucil to provide more regular bowel movements. We discussed the pathophysiology of nonalcoholic fatty liver disease and the potential for progression to NASH or cirrhosis.  She is aware that the only proven effective therapy for nonalcoholic fatty liver disease is weight loss through diet and exercise.  She will continue to work on improving her diet and increasing physical activity.  Iron deficiency anemia -EGD/colonoscopy - Decrease Nexium to once daily to hopefully improve iron absorption; consider switching to H2RA in the future  Dysphagia, suspect Schatzki ring - EGD with possible dilation  GERD -Decrease Nexium to once daily  Constipation -Start daily fiber supplement (Metamucil)  NAFLD -Mediterranean diet, increase physical activity - Consider repeat elastography in 2 to 3 years  The details, risks (including bleeding, perforation, infection, missed lesions, medication reactions and possible hospitalization or surgery if complications occur), benefits, and alternatives to EGD/colonoscopy with possible biopsy and possible polypectomy were discussed with the patient and she  consents to proceed.   Mina Carlisi E. Tomasa Rand, MD [facility]  CC:  Early, Sung Amabile, NP

## 2022-01-08 NOTE — Patient Instructions (Signed)
_______________________________________________________  If you are age 47 or older, your body mass index should be between 23-30. Your Body mass index is 45.56 kg/m. If this is out of the aforementioned range listed, please consider follow up with your Primary Care Provider.  If you are age 47 or younger, your body mass index should be between 19-25. Your Body mass index is 45.56 kg/m. If this is out of the aformentioned range listed, please consider follow up with your Primary Care Provider.   You have been scheduled for an endoscopy and colonoscopy. Please follow the written instructions given to you at your visit today. Please pick up your prep supplies at the pharmacy within the next 1-3 days. If you use inhalers (even only as needed), please bring them with you on the day of your procedure.  Please purchase Metamucil over the counter. Take as directed.   The [facility] providers would like to encourage you to use [facility] to communicate with providers for non-urgent requests or questions.  Due to long hold times on the telephone, sending your provider a message by [facility] may be a faster and more efficient way to get a response.  Please allow 48 business hours for a response.  Please remember that this is for non-urgent requests.   It was a pleasure to see you today!  Thank you for trusting me with your gastrointestinal care!    Scott E.Tomasa Rand

## 2022-01-09 ENCOUNTER — Ambulatory Visit: Payer: 59

## 2022-01-09 ENCOUNTER — Other Ambulatory Visit (HOSPITAL_BASED_OUTPATIENT_CLINIC_OR_DEPARTMENT_OTHER): Payer: Self-pay | Admitting: Nurse Practitioner

## 2022-01-09 DIAGNOSIS — Z79899 Other long term (current) drug therapy: Secondary | ICD-10-CM | POA: Diagnosis not present

## 2022-01-09 DIAGNOSIS — J4542 Moderate persistent asthma with status asthmaticus: Secondary | ICD-10-CM

## 2022-01-09 DIAGNOSIS — M069 Rheumatoid arthritis, unspecified: Secondary | ICD-10-CM | POA: Diagnosis not present

## 2022-01-09 DIAGNOSIS — H15001 Unspecified scleritis, right eye: Secondary | ICD-10-CM | POA: Diagnosis not present

## 2022-01-16 ENCOUNTER — Telehealth: Payer: Self-pay | Admitting: Nurse Practitioner

## 2022-01-16 NOTE — Telephone Encounter (Signed)
Called pt, disclosed genetic report, no pathogenic mutations or VUS. Negative results. She understands and appreciates the call.   Santiago Glad, NP

## 2022-01-19 ENCOUNTER — Inpatient Hospital Stay: Payer: 59 | Attending: Hematology

## 2022-01-19 ENCOUNTER — Other Ambulatory Visit: Payer: Self-pay

## 2022-01-19 DIAGNOSIS — E538 Deficiency of other specified B group vitamins: Secondary | ICD-10-CM | POA: Insufficient documentation

## 2022-01-19 MED ORDER — CYANOCOBALAMIN 1000 MCG/ML IJ SOLN
1000.0000 ug | Freq: Once | INTRAMUSCULAR | Status: AC
  Administered 2022-01-19: 1000 ug via INTRAMUSCULAR
  Filled 2022-01-19: qty 1

## 2022-01-27 ENCOUNTER — Telehealth: Payer: Self-pay | Admitting: Nurse Practitioner

## 2022-01-27 NOTE — Telephone Encounter (Signed)
P.A. Darcel Bayley completed

## 2022-01-28 DIAGNOSIS — R0602 Shortness of breath: Secondary | ICD-10-CM | POA: Diagnosis not present

## 2022-01-28 DIAGNOSIS — Z743 Need for continuous supervision: Secondary | ICD-10-CM | POA: Diagnosis not present

## 2022-01-28 DIAGNOSIS — R0789 Other chest pain: Secondary | ICD-10-CM | POA: Diagnosis not present

## 2022-01-28 DIAGNOSIS — R110 Nausea: Secondary | ICD-10-CM | POA: Diagnosis not present

## 2022-01-28 DIAGNOSIS — R079 Chest pain, unspecified: Secondary | ICD-10-CM | POA: Diagnosis not present

## 2022-01-28 DIAGNOSIS — E1165 Type 2 diabetes mellitus with hyperglycemia: Secondary | ICD-10-CM | POA: Diagnosis not present

## 2022-01-28 DIAGNOSIS — R739 Hyperglycemia, unspecified: Secondary | ICD-10-CM | POA: Diagnosis not present

## 2022-01-29 ENCOUNTER — Encounter: Payer: Self-pay | Admitting: Nurse Practitioner

## 2022-01-29 DIAGNOSIS — R0602 Shortness of breath: Secondary | ICD-10-CM | POA: Diagnosis not present

## 2022-01-29 DIAGNOSIS — R079 Chest pain, unspecified: Secondary | ICD-10-CM | POA: Diagnosis not present

## 2022-01-31 DIAGNOSIS — Z6841 Body Mass Index (BMI) 40.0 and over, adult: Secondary | ICD-10-CM | POA: Diagnosis not present

## 2022-01-31 DIAGNOSIS — D508 Other iron deficiency anemias: Secondary | ICD-10-CM | POA: Diagnosis not present

## 2022-01-31 DIAGNOSIS — H15001 Unspecified scleritis, right eye: Secondary | ICD-10-CM | POA: Diagnosis not present

## 2022-01-31 DIAGNOSIS — M791 Myalgia, unspecified site: Secondary | ICD-10-CM | POA: Diagnosis not present

## 2022-01-31 DIAGNOSIS — M0579 Rheumatoid arthritis with rheumatoid factor of multiple sites without organ or systems involvement: Secondary | ICD-10-CM | POA: Diagnosis not present

## 2022-01-31 DIAGNOSIS — H04123 Dry eye syndrome of bilateral lacrimal glands: Secondary | ICD-10-CM | POA: Diagnosis not present

## 2022-02-02 ENCOUNTER — Encounter: Payer: Self-pay | Admitting: Nurse Practitioner

## 2022-02-02 ENCOUNTER — Other Ambulatory Visit: Payer: Self-pay | Admitting: Nurse Practitioner

## 2022-02-02 ENCOUNTER — Telehealth (INDEPENDENT_AMBULATORY_CARE_PROVIDER_SITE_OTHER): Payer: 59 | Admitting: Nurse Practitioner

## 2022-02-02 VITALS — Ht 66.0 in | Wt 260.0 lb

## 2022-02-02 DIAGNOSIS — G4710 Hypersomnia, unspecified: Secondary | ICD-10-CM | POA: Diagnosis not present

## 2022-02-02 DIAGNOSIS — R918 Other nonspecific abnormal finding of lung field: Secondary | ICD-10-CM | POA: Insufficient documentation

## 2022-02-02 DIAGNOSIS — F900 Attention-deficit hyperactivity disorder, predominantly inattentive type: Secondary | ICD-10-CM | POA: Diagnosis not present

## 2022-02-02 DIAGNOSIS — H15003 Unspecified scleritis, bilateral: Secondary | ICD-10-CM

## 2022-02-02 DIAGNOSIS — F410 Panic disorder [episodic paroxysmal anxiety] without agoraphobia: Secondary | ICD-10-CM

## 2022-02-02 DIAGNOSIS — I509 Heart failure, unspecified: Secondary | ICD-10-CM

## 2022-02-02 DIAGNOSIS — M0579 Rheumatoid arthritis with rheumatoid factor of multiple sites without organ or systems involvement: Secondary | ICD-10-CM | POA: Diagnosis not present

## 2022-02-02 DIAGNOSIS — I152 Hypertension secondary to endocrine disorders: Secondary | ICD-10-CM

## 2022-02-02 DIAGNOSIS — F4320 Adjustment disorder, unspecified: Secondary | ICD-10-CM

## 2022-02-02 DIAGNOSIS — E1139 Type 2 diabetes mellitus with other diabetic ophthalmic complication: Secondary | ICD-10-CM | POA: Diagnosis not present

## 2022-02-02 DIAGNOSIS — F339 Major depressive disorder, recurrent, unspecified: Secondary | ICD-10-CM

## 2022-02-02 DIAGNOSIS — E1159 Type 2 diabetes mellitus with other circulatory complications: Secondary | ICD-10-CM

## 2022-02-02 DIAGNOSIS — F5104 Psychophysiologic insomnia: Secondary | ICD-10-CM

## 2022-02-02 DIAGNOSIS — G4733 Obstructive sleep apnea (adult) (pediatric): Secondary | ICD-10-CM

## 2022-02-02 DIAGNOSIS — F4321 Adjustment disorder with depressed mood: Secondary | ICD-10-CM | POA: Insufficient documentation

## 2022-02-02 DIAGNOSIS — Z794 Long term (current) use of insulin: Secondary | ICD-10-CM

## 2022-02-02 DIAGNOSIS — R69 Illness, unspecified: Secondary | ICD-10-CM | POA: Diagnosis not present

## 2022-02-02 DIAGNOSIS — Z7985 Long-term (current) use of injectable non-insulin antidiabetic drugs: Secondary | ICD-10-CM

## 2022-02-02 DIAGNOSIS — Z87891 Personal history of nicotine dependence: Secondary | ICD-10-CM

## 2022-02-02 HISTORY — DX: Psychophysiologic insomnia: F51.04

## 2022-02-02 HISTORY — DX: Adjustment disorder, unspecified: F43.20

## 2022-02-02 HISTORY — DX: Panic disorder (episodic paroxysmal anxiety): F41.0

## 2022-02-02 MED ORDER — ALPRAZOLAM 0.5 MG PO TABS: 0.2500 mg | ORAL_TABLET | Freq: Two times a day (BID) | ORAL | 2 refills | Status: DC | PRN

## 2022-02-02 MED ORDER — AMPHETAMINE-DEXTROAMPHET ER 20 MG PO CP24: 40.0000 mg | ORAL_CAPSULE | ORAL | 0 refills | Status: DC

## 2022-02-02 MED ORDER — DEXCOM G7 SENSOR MISC: 11 refills | Status: DC

## 2022-02-02 MED ORDER — TRAZODONE HCL 100 MG PO TABS: 100.0000 mg | ORAL_TABLET | Freq: Every evening | ORAL | 3 refills | Status: DC | PRN

## 2022-02-02 MED ORDER — VENLAFAXINE HCL ER 75 MG PO CP24: 75.0000 mg | ORAL_CAPSULE | Freq: Every day | ORAL | 3 refills | Status: DC

## 2022-02-02 MED ORDER — VENLAFAXINE HCL ER 150 MG PO CP24: 150.0000 mg | ORAL_CAPSULE | Freq: Every day | ORAL | 3 refills | Status: DC

## 2022-02-02 MED ORDER — AMPHETAMINE-DEXTROAMPHET ER 20 MG PO CP24: 40.0000 mg | ORAL_CAPSULE | Freq: Every day | ORAL | 0 refills | Status: DC

## 2022-02-02 MED ORDER — TRESIBA FLEXTOUCH 100 UNIT/ML ~~LOC~~ SOPN: 10.0000 [IU] | PEN_INJECTOR | Freq: Every day | SUBCUTANEOUS | 3 refills | Status: DC

## 2022-02-02 MED ORDER — TIRZEPATIDE 2.5 MG/0.5ML ~~LOC~~ SOAJ: 2.5000 mg | SUBCUTANEOUS | 0 refills | Status: DC

## 2022-02-02 NOTE — Progress Notes (Signed)
Orma Render, DNP, AGNP-c [place] [address] [place], [place] [address] [phone]  Subjective:   Melissa Massey is a 47 y.o. female presents to day for evaluation of: Blood Sugar She has been on high dose prednisone for scleritis and RA and she is unable to come off of the medication. She recently went to the ED with blood sugars >400. She has been taking her medication as prescribed, but her sugars are not well controlled. She has historically been on trulicity, mounjaro, ozempic, glipizide, metformin, and using diet and exercise to help control her blood sugars. She has only had success with mounjaro for the control of her blood sugars. She tells me the ED recommended she start on insulin, but she would really like to avoid this if possible. At this time her blood sugars are completely out of control due to chronic high dose prednisone. She will not be able to come off of this medication anytime in the foreseeable future.  Grief Reaction Mother passed away a month ago and her father has been diagnosed with stage 4 cancer and they placed him in hospice care yesterday. Her daughter has also been diagnosed with RA. She tells me she is really having a hard time dealing with her anxiety right now.   PMH, Medications, and Allergies reviewed and updated in chart as appropriate.   ROS negative except for what is listed in HPI. Objective:  Ht 5\' 6"  (1.676 m)   Wt 260 lb (117.9 kg)   BMI 41.97 kg/m  Physical Exam Vitals and nursing note reviewed.  Constitutional:      General: She is not in acute distress.    Appearance: Normal appearance. She is obese.  HENT:     Head: Normocephalic.  Eyes:     Extraocular Movements: Extraocular movements intact.     Conjunctiva/sclera: Conjunctivae normal.     Pupils: Pupils are equal, round, and reactive to light.  Neck:     Vascular: No carotid bruit.  Cardiovascular:     Rate and Rhythm: Normal rate and regular rhythm.      Pulses: Normal pulses.     Heart sounds: Normal heart sounds. No murmur heard. Pulmonary:     Effort: Pulmonary effort is normal.     Breath sounds: Normal breath sounds. No wheezing.  Abdominal:     General: Bowel sounds are normal. There is no distension.     Palpations: Abdomen is soft.     Tenderness: There is no abdominal tenderness. There is no guarding.  Musculoskeletal:        General: Normal range of motion.     Cervical back: Normal range of motion and neck supple.     Right lower leg: No edema.     Left lower leg: No edema.  Lymphadenopathy:     Cervical: No cervical adenopathy.  Skin:    General: Skin is warm and dry.     Capillary Refill: Capillary refill takes less than 2 seconds.  Neurological:     General: No focal deficit present.     Mental Status: She is alert and oriented to person, place, and time.  Psychiatric:        Mood and Affect: Mood normal.        Behavior: Behavior normal.        Thought Content: Thought content normal.        Judgment: Judgment normal.        Assessment & Plan:   Problem  List Items Addressed This Visit     Attention deficit hyperactivity disorder (ADHD), predominantly inattentive type    Chronic. Well controlled on current regimen. Will send refills today.       Relevant Medications   venlafaxine XR (EFFEXOR-XR) 150 MG 24 hr capsule   amphetamine-dextroamphetamine (ADDERALL XR) 20 MG 24 hr capsule   amphetamine-dextroamphetamine (ADDERALL XR) 20 MG 24 hr capsule (Start on 03/02/2022)   amphetamine-dextroamphetamine (ADDERALL XR) 20 MG 24 hr capsule (Start on 03/30/2022)   Hypertension associated with diabetes ([place])    Chronic. Blood pressures are fairly well controlled at this time. I recommend she continue to monitor her blood pressures at home and let me know if her blood pressure readings are greater than 130/80. Continue current medications.       Relevant Medications   insulin degludec (TRESIBA FLEXTOUCH) 100  UNIT/ML FlexTouch Pen   tirzepatide (MOUNJARO) 2.5 MG/0.5ML Pen   Obstructive sleep apnea syndrome, severe    Chronic. In the setting of CHF, DM, HTN, and HLD I feel that improved control of her blood sugars and weight management will help with her cormorbidities. Will send prescription for mounjaro today. She has had success on this medication in the past, but has not been able to get coverage for this. We will try again at this time.       Relevant Medications   tirzepatide (MOUNJARO) 2.5 MG/0.5ML Pen   Other Relevant Orders   Ambulatory referral to Pulmonology   Rheumatoid arthritis involving multiple sites with positive rheumatoid factor (HCC)    Chronic prednisone treatment controlling RA well. No worsening symptoms at this time of RA. Will monitor closely.       Severe obesity (BMI >= 40) (HCC)    BMI 41.97. At this time her diabetes is not well controlled and I am concerned for worsening exacerbation of comorbid conditions. We will plan to start mounjaro in addition to diet and exercise to see if we can get improved control.       Relevant Medications   insulin degludec (TRESIBA FLEXTOUCH) 100 UNIT/ML FlexTouch Pen   amphetamine-dextroamphetamine (ADDERALL XR) 20 MG 24 hr capsule   amphetamine-dextroamphetamine (ADDERALL XR) 20 MG 24 hr capsule (Start on 03/02/2022)   amphetamine-dextroamphetamine (ADDERALL XR) 20 MG 24 hr capsule (Start on 03/30/2022)   tirzepatide [facility]) 2.5 MG/0.5ML Pen   Type 2 diabetes mellitus with hyperglycemia, without long-term current use of insulin (HCC) - Primary    Chronic. Blood sugars not controlled at this time. Unfortunately she will not be able to come off of prednisone any time in the near future and I am concerned this will cause continued elevated blood sugar readings. Recommend that we begin long acting insulin and mounjaro today to see if we can get improved control. She has tried multiple medications in the past without successful control of  her BG levels. She will continue to monitor closely.       Relevant Medications   insulin degludec (TRESIBA FLEXTOUCH) 100 UNIT/ML FlexTouch Pen   Continuous Blood Gluc Sensor (DEXCOM G7 SENSOR) MISC   tirzepatide [facility]) 2.5 MG/0.5ML Pen   Chronic congestive heart failure ([place])    Recent ED visit with elevated blood sugars and CP. At this time, appears that this is not related to CHF, but anxiety symptoms from increased stressors. We definitely need to get improved control of her blood sugars to prevent worsening cardiovascular damage. We will plan to start long acting insulin and mounjaro at this time, as  this has been very effective for control in the past. No alarm symptoms present at this time. Will continue to monitor closely.       Relevant Medications   tirzepatide [facility]) 2.5 MG/0.5ML Pen   Depression, recurrent (HCC)    Long term depressive symptoms exacerbated with recent increased stressors. She has been well controlled on venlafaxine until now. We will plan to increase venlafaxine dose to see if we can get better control of her mood at this time. We will plan to follow up in a few weeks to see how she is feeling. She can reach out at any time if her symptoms worsen. No alarm sx present at this time.       Relevant Medications   ALPRAZolam (XANAX) 0.5 MG tablet   traZODone (DESYREL) 100 MG tablet   venlafaxine XR (EFFEXOR-XR) 150 MG 24 hr capsule   venlafaxine XR (EFFEXOR XR) 75 MG 24 hr capsule   Multiple pulmonary nodules   Relevant Orders   Ambulatory referral to Pulmonology   CT CHEST NODULE FOLLOW UP LOW DOSE W/O   Grief reaction   Relevant Medications   ALPRAZolam (XANAX) 0.5 MG tablet   venlafaxine XR (EFFEXOR-XR) 150 MG 24 hr capsule   venlafaxine XR (EFFEXOR XR) 75 MG 24 hr capsule   Severe anxiety with panic   Relevant Medications   ALPRAZolam (XANAX) 0.5 MG tablet   traZODone (DESYREL) 100 MG tablet   venlafaxine XR (EFFEXOR-XR) 150 MG 24 hr capsule    venlafaxine XR (EFFEXOR XR) 75 MG 24 hr capsule   Psychophysiological insomnia   Relevant Medications   traZODone (DESYREL) 100 MG tablet   venlafaxine XR (EFFEXOR-XR) 150 MG 24 hr capsule   venlafaxine XR (EFFEXOR XR) 75 MG 24 hr capsule   Hypersomnia   Relevant Medications   amphetamine-dextroamphetamine (ADDERALL XR) 20 MG 24 hr capsule   amphetamine-dextroamphetamine (ADDERALL XR) 20 MG 24 hr capsule (Start on 03/02/2022)   amphetamine-dextroamphetamine (ADDERALL XR) 20 MG 24 hr capsule (Start on 03/30/2022)   Other Visit Diagnoses     Scleritis of both eyes             Tollie Eth, DNP, AGNP-c 02/08/2022  10:36 PM    History, Medications, Surgery, SDOH, and Family History reviewed and updated as appropriate.

## 2022-02-05 ENCOUNTER — Other Ambulatory Visit: Payer: Self-pay | Admitting: Nurse Practitioner

## 2022-02-05 ENCOUNTER — Telehealth: Payer: Self-pay | Admitting: Nurse Practitioner

## 2022-02-05 ENCOUNTER — Other Ambulatory Visit: Payer: Self-pay

## 2022-02-05 DIAGNOSIS — R918 Other nonspecific abnormal finding of lung field: Secondary | ICD-10-CM

## 2022-02-05 MED ORDER — LOSARTAN POTASSIUM 100 MG PO TABS: 100.0000 mg | ORAL_TABLET | Freq: Every day | ORAL | 1 refills | Status: DC

## 2022-02-05 NOTE — Telephone Encounter (Signed)
[facility] sent refill request for losartan 100 mg please send to the  [place], [place]

## 2022-02-06 ENCOUNTER — Telehealth: Payer: Self-pay | Admitting: Nurse Practitioner

## 2022-02-06 ENCOUNTER — Inpatient Hospital Stay: Payer: 59 | Attending: Hematology

## 2022-02-06 ENCOUNTER — Inpatient Hospital Stay: Payer: 59

## 2022-02-08 NOTE — Assessment & Plan Note (Signed)
Chronic. Blood pressures are fairly well controlled at this time. I recommend she continue to monitor her blood pressures at home and let me know if her blood pressure readings are greater than 130/80. Continue current medications.

## 2022-02-08 NOTE — Assessment & Plan Note (Signed)
BMI 41.97. At this time her diabetes is not well controlled and I am concerned for worsening exacerbation of comorbid conditions. We will plan to start mounjaro in addition to diet and exercise to see if we can get improved control.

## 2022-02-08 NOTE — Assessment & Plan Note (Signed)
Chronic prednisone treatment controlling RA well. No worsening symptoms at this time of RA. Will monitor closely.

## 2022-02-08 NOTE — Assessment & Plan Note (Signed)
Long term depressive symptoms exacerbated with recent increased stressors. She has been well controlled on venlafaxine until now. We will plan to increase venlafaxine dose to see if we can get better control of her mood at this time. We will plan to follow up in a few weeks to see how she is feeling. She can reach out at any time if her symptoms worsen. No alarm sx present at this time.

## 2022-02-08 NOTE — Assessment & Plan Note (Signed)
Chronic. Blood sugars not controlled at this time. Unfortunately she will not be able to come off of prednisone any time in the near future and I am concerned this will cause continued elevated blood sugar readings. Recommend that we begin long acting insulin and mounjaro today to see if we can get improved control. She has tried multiple medications in the past without successful control of her BG levels. She will continue to monitor closely.

## 2022-02-08 NOTE — Assessment & Plan Note (Signed)
Chronic. Well controlled on current regimen. Will send refills today.

## 2022-02-08 NOTE — Assessment & Plan Note (Signed)
Recent ED visit with elevated blood sugars and CP. At this time, appears that this is not related to CHF, but anxiety symptoms from increased stressors. We definitely need to get improved control of her blood sugars to prevent worsening cardiovascular damage. We will plan to start long acting insulin and mounjaro at this time, as this has been very effective for control in the past. No alarm symptoms present at this time. Will continue to monitor closely.

## 2022-02-08 NOTE — Assessment & Plan Note (Signed)
Chronic. In the setting of CHF, DM, HTN, and HLD I feel that improved control of her blood sugars and weight management will help with her cormorbidities. Will send prescription for mounjaro today. She has had success on this medication in the past, but has not been able to get coverage for this. We will try again at this time.

## 2022-02-09 DIAGNOSIS — Z111 Encounter for screening for respiratory tuberculosis: Secondary | ICD-10-CM | POA: Diagnosis not present

## 2022-02-09 DIAGNOSIS — Z79899 Other long term (current) drug therapy: Secondary | ICD-10-CM | POA: Diagnosis not present

## 2022-02-09 DIAGNOSIS — M0579 Rheumatoid arthritis with rheumatoid factor of multiple sites without organ or systems involvement: Secondary | ICD-10-CM | POA: Diagnosis not present

## 2022-02-09 DIAGNOSIS — R5383 Other fatigue: Secondary | ICD-10-CM | POA: Diagnosis not present

## 2022-02-12 DIAGNOSIS — H15001 Unspecified scleritis, right eye: Secondary | ICD-10-CM | POA: Diagnosis not present

## 2022-02-14 ENCOUNTER — Encounter: Payer: 59 | Admitting: Gastroenterology

## 2022-02-16 ENCOUNTER — Ambulatory Visit: Payer: Self-pay | Admitting: Genetic Counselor

## 2022-02-16 ENCOUNTER — Ambulatory Visit (HOSPITAL_COMMUNITY): Payer: 59

## 2022-02-16 DIAGNOSIS — Z1379 Encounter for other screening for genetic and chromosomal anomalies: Secondary | ICD-10-CM | POA: Insufficient documentation

## 2022-02-16 DIAGNOSIS — Z8041 Family history of malignant neoplasm of ovary: Secondary | ICD-10-CM

## 2022-02-16 DIAGNOSIS — Z803 Family history of malignant neoplasm of breast: Secondary | ICD-10-CM

## 2022-02-16 NOTE — Progress Notes (Signed)
HPI:   Ms. Koehn was previously seen in the [place] clinic due to a family history of breast and ovarian cancer and concerns regarding a hereditary predisposition to cancer. Please refer to our prior cancer genetics clinic note for more information regarding our discussion, assessment and recommendations, at the time.   CANCER HISTORY:   Ms. Gato is a 47 y.o. female with no personal history of cancer.     FAMILY HISTORY:  We obtained a detailed, 4-generation family history.  Significant diagnoses are listed below:      Family History  Problem Relation Age of Onset   Cancer - Other Sister 1        neuroblastoma   Breast cancer Paternal Aunt          dx 32s   Lung cancer Maternal Grandfather          d. 55s   Breast cancer Paternal Grandmother     Ovarian cancer Paternal Grandmother     Uterine cancer Other          MGM's sister; dx before 27     Ms. Mennella is unaware of previous family history of genetic testing for hereditary cancer risks. She has limited information about her paternal family history and is not in contact with paternal relatives.  Her maternal aunt with ovarian cancer was unavailable for genetic testing at this time.  There is no reported Ashkenazi Jewish ancestry. There is no known consanguinity.  GENETIC TEST RESULTS:  The [facility] CancerNext-Expanded +RNAinsight Panel found no pathogenic mutations.   The CancerNext-Expanded gene panel offered by [facility] and includes sequencing, rearrangement, and RNA analysis for the following 77 genes: AIP, ALK, APC, ATM, AXIN2, BAP1, BARD1, BLM, BMPR1A, BRCA1, BRCA2, BRIP1, CDC73, CDH1, CDK4, CDKN1B, CDKN2A, CHEK2, CTNNA1, DICER1, FANCC, FH, FLCN, GALNT12, KIF1B, LZTR1, MAX, MEN1, MET, MLH1, MSH2, MSH3, MSH6, MUTYH, NBN, NF1, NF2, NTHL1, PALB2, PHOX2B, PMS2, POT1, PRKAR1A, PTCH1, PTEN, RAD51C, RAD51D, RB1, RECQL, RET, SDHA, SDHAF2, SDHB, SDHC, SDHD, SMAD4, SMARCA4, SMARCB1, SMARCE1, STK11, SUFU, TMEM127,  TP53, TSC1, TSC2, VHL and XRCC2 (sequencing and deletion/duplication); EGFR, EGLN1, HOXB13, KIT, MITF, PDGFRA, POLD1, and POLE (sequencing only); EPCAM and GREM1 (deletion/duplication only).   The test report has been scanned into EPIC and is located under the Molecular Pathology section of the Results Review tab.  A portion of the result report is included below for reference. Genetic testing reported out on December 29, 2021.      Even though a pathogenic variant was not identified, possible explanations for the cancer in the family may include: There may be no hereditary risk for cancer in the family. The cancers in Ms. Jaime's family may be sporadic/familial or due to other genetic and environmental factors. There may be a gene mutation in one of these genes that current testing methods cannot detect but that chance is small. There could be another gene that has not yet been discovered, or that we have not yet tested, that is responsible for the cancer diagnoses in the family.  It is also possible there is a hereditary cause for the cancer in the family that Ms. Grassel did not inherit.  Therefore, it is important to remain in touch with cancer genetics in the future so that we can continue to offer Ms. Braziel the most up to date genetic testing.    ADDITIONAL GENETIC TESTING:  We discussed with Ms. Agar that her genetic testing was fairly extensive.  If there are additional relevant  genes identified to increase cancer risk that can be analyzed in the future, we would be happy to discuss and coordinate this testing at that time.     CANCER SCREENING RECOMMENDATIONS:  Ms. Tonche test result is considered negative (normal).  This means that we have not identified a hereditary cause for her family history of cancer at this time.   An individual's cancer risk and medical management are not determined by genetic test results alone. Overall cancer risk assessment incorporates additional  factors, including personal medical history, family history, and any available genetic information that may result in a personalized plan for cancer prevention and surveillance. Therefore, it is recommended she continue to follow the cancer management and screening guidelines provided by her primary healthcare provider.  RECOMMENDATIONS FOR FAMILY MEMBERS:   Since she did not inherit a identifiable mutation in a cancer predisposition gene included on this panel, her children could not have inherited a known mutation from her in one of these genes. Individuals in this family might be at some increased risk of developing cancer, over the general population risk, due to the family history of cancer.  Individuals in the family should notify their providers of the family history of cancer. We recommend women in this family have a yearly mammogram beginning at age 18, or 68 years younger than the earliest onset of cancer, an annual clinical breast exam, and perform monthly breast self-exams.   Other members of the family may still carry a pathogenic variant in one of these genes that Ms. Cart did not inherit. Based on the family history, we recommend her maternal and paternal aunts with a breast/ovarian cancer history have genetic counseling and testing. Ms. Tetro will let us know if we can be of any assistance in coordinating genetic counseling and/or testing for this family member.     FOLLOW-UP:  Lastly, we discussed with Ms. Beckers that cancer genetics is a rapidly advancing field and it is possible that new genetic tests will be appropriate for her and/or her family members in the future. We encouraged her to remain in contact with cancer genetics on an annual basis so we can update her personal and family histories and let her know of advances in cancer genetics that may benefit this family.   Our contact number was provided. Ms. Vanzile questions were answered to her satisfaction, and she knows she  is welcome to call us at anytime with additional questions or concerns.   Janille Draughon M. Joette Catching, [place], [facility] Genetic Counselor [email or web address] (P) [phone]

## 2022-03-06 ENCOUNTER — Inpatient Hospital Stay: Payer: 59 | Attending: Hematology

## 2022-03-07 ENCOUNTER — Encounter: Payer: Self-pay | Admitting: Pulmonary Disease

## 2022-03-07 ENCOUNTER — Ambulatory Visit (INDEPENDENT_AMBULATORY_CARE_PROVIDER_SITE_OTHER): Payer: 59 | Admitting: Pulmonary Disease

## 2022-03-07 VITALS — BP 122/76 | HR 81 | Ht 66.0 in | Wt 279.6 lb

## 2022-03-07 DIAGNOSIS — R9389 Abnormal findings on diagnostic imaging of other specified body structures: Secondary | ICD-10-CM

## 2022-03-07 MED ORDER — TRELEGY ELLIPTA 200-62.5-25 MCG/ACT IN AEPB: 1.0000 | INHALATION_SPRAY | Freq: Every day | RESPIRATORY_TRACT | 3 refills | Status: DC

## 2022-03-07 NOTE — Progress Notes (Signed)
Melissa Massey    [id number]    1975/02/27  Primary Care Physician:Early, Coralee Pesa, NP  Referring Physician: Orma Render, NP [place],  [place] [address]  Chief complaint:   Patient being seen for sleep apnea  HPI:  Has had sleep apnea for many years Using CPAP on a nightly basis  She was supposed to get a new machine recently but due to job change and insurance changes Was unable to get a new machine  Benefit from CPAP therapy Usually goes to bed between 10 and 11 Takes about 30 minutes to fall asleep About 2 awakenings Final wake up time about 7 AM  Weight has gradually come down, down about 25 pounds  She does have a history of rheumatoid arthritis, history of scleritis, Graves' disease, Hashimoto's -Was recently on as high as 80 mg of prednisone which she is tapering down gradually, currently on 50 mg of prednisone  She does have a background history of asthma that has been poorly controlled She was on albuterol and Breo, did not notice any significant improvement with the [facility], has been using albuterol 3-4 times a day  Does have a history of hypertension, diabetes, allergy and sinus problems Lumbar fusion surgery in the past  She was a former smoker quit about 10 years ago  Current machine is not working well, taped up  Works as a IT consultant Medications as of 03/07/2022  Medication Sig   albuterol (PROVENTIL) (2.5 MG/3ML) 0.083% nebulizer solution Take 3 mLs (2.5 mg total) by nebulization every 4 (four) hours as needed for wheezing or shortness of breath (please include nebulizer machine, hoses, and mask if needed.).   albuterol (VENTOLIN HFA) 108 (90 Base) MCG/ACT inhaler INHALE 2 PUFFS BY MOUTH EVERY 6 HOURS AS NEEDED FOR WHEEZING AND FOR SHORTNESS OF BREATH   ALPRAZolam (XANAX) 0.5 MG tablet Take 0.5-1 tablets (0.25-0.5 mg total) by mouth 2 (two) times daily as needed for anxiety.    amphetamine-dextroamphetamine (ADDERALL XR) 20 MG 24 hr capsule Take 2 capsules (40 mg total) by mouth daily.   amphetamine-dextroamphetamine (ADDERALL XR) 20 MG 24 hr capsule Take 2 capsules (40 mg total) by mouth every morning.   [START ON 03/30/2022] amphetamine-dextroamphetamine (ADDERALL XR) 20 MG 24 hr capsule Take 2 capsules (40 mg total) by mouth every morning.   celecoxib (CELEBREX) 200 MG capsule Take 1 capsule (200 mg total) by mouth 2 (two) times daily.   Continuous Blood Gluc Sensor (DEXCOM G7 SENSOR) MISC Apply one device every 10 days as directed for continuous glucose measurement.   cyclobenzaprine (FLEXERIL) 5 MG tablet Take 5 mg by mouth at bedtime as needed for muscle spasms.   esomeprazole (NEXIUM) 40 MG capsule Take 40 mg by mouth 2 (two) times daily.   etanercept (ENBREL SURECLICK) 50 MG/ML injection 48m   glipiZIDE (GLUCOTROL) 5 MG tablet Take 5 mg by mouth 2 (two) times daily before a meal.   hydrochlorothiazide (HYDRODIURIL) 25 MG tablet Take 25 mg by mouth daily.   insulin degludec (TRESIBA FLEXTOUCH) 100 UNIT/ML FlexTouch Pen Inject 10 Units into the skin at bedtime. Increase by 2 units every 4 days for blood sugar higher than 150 fasting in the morning   Iron, Ferrous Sulfate, 325 (65 Fe) MG TABS Take 325 mg by mouth daily.   levothyroxine (SYNTHROID) 125 MCG tablet Take 250 mcg by mouth daily.   liothyronine (CYTOMEL) 5 MCG tablet Take 5 mcg  by mouth 2 (two) times daily.   losartan (COZAAR) 100 MG tablet Take 1 tablet (100 mg total) by mouth daily.   metFORMIN (GLUCOPHAGE-XR) 500 MG 24 hr tablet TAKE 2 TABLETS BY MOUTH IN THE MORNING AND 2 AT BEDTIME   metoprolol tartrate (LOPRESSOR) 100 MG tablet Take 2 hours prior to ct scan   montelukast (SINGULAIR) 10 MG tablet Take 10 mg by mouth daily.   ondansetron (ZOFRAN) 8 MG tablet Take 1 tablet (8 mg total) by mouth every 8 (eight) hours as needed for nausea or vomiting.   predniSONE (DELTASONE) 20 MG tablet Take by mouth.    promethazine (PHENERGAN) 25 MG tablet Take 1 tablet (25 mg total) by mouth every 8 (eight) hours as needed for nausea or vomiting.   rosuvastatin (CRESTOR) 40 MG tablet Take 1 tablet (40 mg total) by mouth daily.   tirzepatide [facility]) 2.5 MG/0.5ML Pen Inject 2.5 mg into the skin once a week.   traZODone (DESYREL) 100 MG tablet Take 1 tablet (100 mg total) by mouth at bedtime as needed for sleep.   venlafaxine XR (EFFEXOR XR) 75 MG 24 hr capsule Take 1 capsule (75 mg total) by mouth daily with breakfast. Take with 167m to make 2280mtotal daily.   venlafaxine XR (EFFEXOR-XR) 150 MG 24 hr capsule Take 1 capsule (150 mg total) by mouth daily with breakfast. Take with 7541mo make 225m60mily.   Vitamin D, Ergocalciferol, (DRISDOL) 1.25 MG (50000 UNIT) CAPS capsule Take 50,000 Units by mouth every Sunday.   BREO ELLIPTA 200-25 MCG/ACT AEPB Inhale 1 puff into the lungs daily. (Patient not taking: Reported on 03/07/2022)   Facility-Administered Encounter Medications as of 03/07/2022  Medication   cyanocobalamin ((VITAMIN B-12)) injection 1,000 mcg   cyanocobalamin ((VITAMIN B-12)) injection 1,000 mcg   cyanocobalamin (VITAMIN B12) injection 1,000 mcg    Allergies as of 03/07/2022   (No Known Allergies)    Past Medical History:  Diagnosis Date   Arthritis    Asthma    CHF (congestive heart failure) (HCC)    Diabetes mellitus without complication (HCC)    DVT (deep venous thrombosis) (HCC)    Graves disease    Hyperlipidemia    Hypertension    PCOS (polycystic ovarian syndrome)    Rheumatoid arthritis (HCC)[place]  Past Surgical History:  Procedure Laterality Date   CESAREAN SECTION     x2   LUMBAR FUSION     TUBAL LIGATION      Family History  Problem Relation Age of Onset   Heart attack Mother    Stroke Mother    Diabetes Mother    Hypertension Father    Colon polyps Father    Cancer - Other Sister 1       neuroblastoma   Lung cancer Maternal Grandfather        d. 60s 36sBreast cancer Paternal Grandmother    Ovarian cancer Paternal Grandmother    Breast cancer Paternal Aunt        dx 50s 58sterine cancer Other        MGM's sister; dx before 60  79Social History   Socioeconomic History   Marital status: Single    Spouse name: Not on file   Number of children: 4   Years of education: Not on file   Highest education level: Not on file  Occupational History   Not on file  Tobacco Use   Smoking status: Former  Packs/day: 1.00    Types: Cigarettes   Smokeless tobacco: Never  Vaping Use   Vaping Use: Former   Substances: Nicotine  Substance and Sexual Activity   Alcohol use: No   Drug use: No   Sexual activity: Yes    Birth control/protection: Surgical  Other Topics Concern   Not on file  Social History Narrative   Not on file   Social Determinants of Health   Financial Resource Strain: Not on file  Food Insecurity: Not on file  Transportation Needs: Not on file  Physical Activity: Not on file  Stress: Not on file  Social Connections: Not on file  Intimate Partner Violence: Not on file    Review of Systems  Constitutional:  Positive for fatigue.  Respiratory:  Positive for apnea and shortness of breath.   Psychiatric/Behavioral:  Positive for sleep disturbance.     Vitals:   03/07/22 1335  BP: 122/76  Pulse: 81  SpO2: 98%     Physical Exam Constitutional:      Appearance: She is obese.  HENT:     Head: Normocephalic.     Nose: Nose normal.     Mouth/Throat:     Mouth: Mucous membranes are moist.  Eyes:     General: No scleral icterus.    Pupils: Pupils are equal, round, and reactive to light.  Cardiovascular:     Rate and Rhythm: Normal rate and regular rhythm.     Heart sounds: No murmur heard.    No friction rub.  Pulmonary:     Effort: No respiratory distress.     Breath sounds: No stridor. No wheezing or rhonchi.  Musculoskeletal:     Cervical back: No rigidity or tenderness.  Neurological:      Mental Status: She is alert.  Psychiatric:        Mood and Affect: Mood normal.       03/07/2022    1:00 PM  Results of the Epworth flowsheet  Sitting and reading 3  Watching TV 3  Sitting, inactive in a public place (e.g. a theatre or a meeting) 2  As a passenger in a car for an hour without a break 2  Lying down to rest in the afternoon when circumstances permit 3  Sitting and talking to someone 0  Sitting quietly after a lunch without alcohol 2  In a car, while stopped for a few minutes in traffic 0  Total score 15    Data Reviewed: CT chest from 2022 did show multiple lung nodules  Recent cardiac CT reviewed with the patient with 4 mm lung nodule  Echocardiogram 06/29/2021-normal ejection fraction, normal right-sided pressures  Last PFT was in January 2022-results not available  Most recent sleep study was in 2022 when she had a titration study, initial study not available to be reviewed  Assessment:  History of obstructive sleep apnea on CPAP therapy -She is compliant with CPAP and continues to benefit from CPAP therapy -Current machine is becoming dysfunctional and she has had to tape it up to get it to function  History of asthma -Uncontrolled symptoms recently -Daily use of albuterol -Use to use Breo, did not see any good effect from it  Rheumatoid arthritis  History of Graves' disease Hashimoto's  Chronic steroid use  History of depression/anxiety  Cough  Class III obesity  Pathophysiology of sleep disordered breathing discussed with patient  Plan/Recommendations: Request for copy of patient's recent sleep study result  Schedule for high-resolution CT scan  of the chest to rule out significant interstitial lung disease and to follow-up on lung nodules  Schedule for pulmonary function test at next visit  Initiation of Trelegy for control asthma  Continue weight loss efforts  Will provide prescription for new CPAP once study reviewed, most recent  study suggested CPAP of 12, currently using CPAP of 10  Tentative follow-up in 3 months  Encouraged to call with significant concerns   Sherrilyn Rist MD [facility] 03/07/2022, 1:47 PM  CC: Early, Coralee Pesa, NP

## 2022-03-07 NOTE — Patient Instructions (Signed)
Record release for copy of sleep study  High-resolution CT scan of the chest for interstitial lung disease, lung nodules  Schedule for pulmonary function test  Trelegy  Continue albuterol as needed  Continue weight loss efforts  Call us with significant concerns  Once we get a copy of the sleep study, we can use that as basis for prescription for a new CPAP  Tentative follow-up in 3 months

## 2022-03-15 NOTE — Telephone Encounter (Signed)
No response from P.A. resubmitted with medical records

## 2022-03-20 DIAGNOSIS — M791 Myalgia, unspecified site: Secondary | ICD-10-CM | POA: Diagnosis not present

## 2022-03-20 DIAGNOSIS — H15001 Unspecified scleritis, right eye: Secondary | ICD-10-CM | POA: Diagnosis not present

## 2022-03-20 DIAGNOSIS — Z6841 Body Mass Index (BMI) 40.0 and over, adult: Secondary | ICD-10-CM | POA: Diagnosis not present

## 2022-03-20 DIAGNOSIS — M0579 Rheumatoid arthritis with rheumatoid factor of multiple sites without organ or systems involvement: Secondary | ICD-10-CM | POA: Diagnosis not present

## 2022-03-20 DIAGNOSIS — H04123 Dry eye syndrome of bilateral lacrimal glands: Secondary | ICD-10-CM | POA: Diagnosis not present

## 2022-03-20 DIAGNOSIS — D508 Other iron deficiency anemias: Secondary | ICD-10-CM | POA: Diagnosis not present

## 2022-03-29 NOTE — Telephone Encounter (Signed)
We can ask the patient if she is willing to try Victoza. I can ask Quita Skye to contact her to see if she is OK with that and then we can try that. If not, we will need to appeal. A letter would be much better right now, if possible.

## 2022-03-29 NOTE — Telephone Encounter (Signed)
P.A. denied pt needs trial of 2 formulary alternatives Victoza  & Trulicity.  Pt has tried Trulicity, do you want to switch to Victoza or do appeal.  I can write an appeal letter or you can do expedited appeal over the phone t# [phone] ?

## 2022-04-02 ENCOUNTER — Other Ambulatory Visit (HOSPITAL_BASED_OUTPATIENT_CLINIC_OR_DEPARTMENT_OTHER): Payer: Self-pay | Admitting: Nurse Practitioner

## 2022-04-02 DIAGNOSIS — J4542 Moderate persistent asthma with status asthmaticus: Secondary | ICD-10-CM

## 2022-04-05 ENCOUNTER — Other Ambulatory Visit: Payer: 59

## 2022-04-05 ENCOUNTER — Ambulatory Visit: Payer: 59 | Admitting: Podiatry

## 2022-04-12 NOTE — Progress Notes (Deleted)
Patient Care Team: Early, Coralee Pesa, NP as PCP - General (Nurse Practitioner)   CHIEF COMPLAINT: Follow up IDA and B12 deficiency   CURRENT THERAPY: Oral Iron with IV Iron as needed, and B12 injection since 09/2021  INTERVAL HISTORY Melissa Massey returns for follow up as scheduled. Last seen by me 10/18/21. She began B12 injections and received IV Venofer weekly x3. Labs in December and January showed normal CBC, and ferritin normalized.   ROS   Past Medical History:  Diagnosis Date   Arthritis    Asthma    CHF (congestive heart failure) (HCC)    Diabetes mellitus without complication (HCC)    DVT (deep venous thrombosis) (HCC)    Graves disease    Hyperlipidemia    Hypertension    PCOS (polycystic ovarian syndrome)    Rheumatoid arthritis ([place])      Past Surgical History:  Procedure Laterality Date   CESAREAN SECTION     x2   LUMBAR FUSION     TUBAL LIGATION       Outpatient Encounter Medications as of 04/16/2022  Medication Sig   albuterol (PROVENTIL) (2.5 MG/3ML) 0.083% nebulizer solution Take 3 mLs (2.5 mg total) by nebulization every 4 (four) hours as needed for wheezing or shortness of breath (please include nebulizer machine, hoses, and mask if needed.).   albuterol (VENTOLIN HFA) 108 (90 Base) MCG/ACT inhaler INHALE 2 PUFFS BY MOUTH EVERY 6 HOURS AS NEEDED FOR WHEEZING AND FOR SHORTNESS OF BREATH   ALPRAZolam (XANAX) 0.5 MG tablet Take 0.5-1 tablets (0.25-0.5 mg total) by mouth 2 (two) times daily as needed for anxiety.   amphetamine-dextroamphetamine (ADDERALL XR) 20 MG 24 hr capsule Take 2 capsules (40 mg total) by mouth daily.   amphetamine-dextroamphetamine (ADDERALL XR) 20 MG 24 hr capsule Take 2 capsules (40 mg total) by mouth every morning.   amphetamine-dextroamphetamine (ADDERALL XR) 20 MG 24 hr capsule Take 2 capsules (40 mg total) by mouth every morning.   BREO ELLIPTA 200-25 MCG/ACT AEPB Inhale 1 puff into the lungs daily. (Patient not taking: Reported on  03/07/2022)   celecoxib (CELEBREX) 200 MG capsule Take 1 capsule (200 mg total) by mouth 2 (two) times daily.   Continuous Blood Gluc Sensor (DEXCOM G7 SENSOR) MISC Apply one device every 10 days as directed for continuous glucose measurement.   cyclobenzaprine (FLEXERIL) 5 MG tablet Take 5 mg by mouth at bedtime as needed for muscle spasms.   esomeprazole (NEXIUM) 40 MG capsule Take 40 mg by mouth 2 (two) times daily.   etanercept (ENBREL SURECLICK) 50 MG/ML injection 50mg    Fluticasone-Umeclidin-Vilant (TRELEGY ELLIPTA) 200-62.5-25 MCG/ACT AEPB Inhale 1 puff into the lungs daily.   glipiZIDE (GLUCOTROL) 5 MG tablet Take 5 mg by mouth 2 (two) times daily before a meal.   hydrochlorothiazide (HYDRODIURIL) 25 MG tablet Take 25 mg by mouth daily.   insulin degludec (TRESIBA FLEXTOUCH) 100 UNIT/ML FlexTouch Pen Inject 10 Units into the skin at bedtime. Increase by 2 units every 4 days for blood sugar higher than 150 fasting in the morning   Iron, Ferrous Sulfate, 325 (65 Fe) MG TABS Take 325 mg by mouth daily.   levothyroxine (SYNTHROID) 125 MCG tablet Take 250 mcg by mouth daily.   liothyronine (CYTOMEL) 5 MCG tablet Take 5 mcg by mouth 2 (two) times daily.   losartan (COZAAR) 100 MG tablet Take 1 tablet (100 mg total) by mouth daily.   metFORMIN (GLUCOPHAGE-XR) 500 MG 24 hr tablet TAKE 2 TABLETS  BY MOUTH IN THE MORNING AND 2 AT BEDTIME   metoprolol tartrate (LOPRESSOR) 100 MG tablet Take 2 hours prior to ct scan   montelukast (SINGULAIR) 10 MG tablet Take 10 mg by mouth daily.   ondansetron (ZOFRAN) 8 MG tablet Take 1 tablet (8 mg total) by mouth every 8 (eight) hours as needed for nausea or vomiting.   predniSONE (DELTASONE) 20 MG tablet Take by mouth.   promethazine (PHENERGAN) 25 MG tablet Take 1 tablet (25 mg total) by mouth every 8 (eight) hours as needed for nausea or vomiting.   rosuvastatin (CRESTOR) 40 MG tablet Take 1 tablet (40 mg total) by mouth daily.   tirzepatide [facility]) 2.5  MG/0.5ML Pen Inject 2.5 mg into the skin once a week.   traZODone (DESYREL) 100 MG tablet Take 1 tablet (100 mg total) by mouth at bedtime as needed for sleep.   venlafaxine XR (EFFEXOR XR) 75 MG 24 hr capsule Take 1 capsule (75 mg total) by mouth daily with breakfast. Take with 150mg  to make 225mg  total daily.   venlafaxine XR (EFFEXOR-XR) 150 MG 24 hr capsule Take 1 capsule (150 mg total) by mouth daily with breakfast. Take with 75mg  to make 225mg  daily.   Vitamin D, Ergocalciferol, (DRISDOL) 1.25 MG (50000 UNIT) CAPS capsule Take 50,000 Units by mouth every Sunday.   Facility-Administered Encounter Medications as of 04/16/2022  Medication   cyanocobalamin ((VITAMIN B-12)) injection 1,000 mcg   cyanocobalamin ((VITAMIN B-12)) injection 1,000 mcg   cyanocobalamin (VITAMIN B12) injection 1,000 mcg     There were no vitals filed for this visit. There is no height or weight on file to calculate BMI.   PHYSICAL EXAM GENERAL:alert, no distress and comfortable SKIN: no rash  EYES: sclera clear NECK: without mass LYMPH:  no palpable cervical or supraclavicular lymphadenopathy  LUNGS: clear with normal breathing effort HEART: regular rate & rhythm, no lower extremity edema ABDOMEN: abdomen soft, non-tender and normal bowel sounds NEURO: alert & oriented x 3 with fluent speech, no focal motor/sensory deficits Breast exam:  PAC without erythema    CBC    Component Value Date/Time   WBC 8.4 12/18/2021 1405   WBC 7.0 07/31/2013 1345   RBC 4.72 12/18/2021 1405   HGB 13.2 12/18/2021 1405   HGB 9.8 (L) 05/30/2021 1343   HCT 39.7 12/18/2021 1405   HCT 31.8 (L) 05/30/2021 1343   PLT 302 12/18/2021 1405   PLT 389 05/30/2021 1343   MCV 84.1 12/18/2021 1405   MCV 80 05/30/2021 1343   MCH 28.0 12/18/2021 1405   MCHC 33.2 12/18/2021 1405   RDW 18.6 (H) 12/18/2021 1405   RDW 16.9 (H) 05/30/2021 1343   LYMPHSABS 1.5 12/18/2021 1405   LYMPHSABS 1.0 05/30/2021 1343   MONOABS 0.5 12/18/2021  1405   EOSABS 0.0 12/18/2021 1405   EOSABS 0.1 05/30/2021 1343   BASOSABS 0.0 12/18/2021 1405   BASOSABS 0.0 05/30/2021 1343     CMP     Component Value Date/Time   NA 138 05/30/2021 1343   K 4.2 05/30/2021 1343   CL 99 05/30/2021 1343   CO2 20 05/30/2021 1343   GLUCOSE 301 (H) 05/30/2021 1343   GLUCOSE 80 07/31/2013 1845   BUN 10 05/30/2021 1343   CREATININE 0.67 05/30/2021 1343   CALCIUM 9.0 05/30/2021 1343   PROT 6.3 05/30/2021 1343   ALBUMIN 4.1 05/30/2021 1343   AST 21 05/30/2021 1343   ALT 35 (H) 05/30/2021 1343   ALKPHOS 91 05/30/2021 1343  BILITOT 0.2 05/30/2021 1343   GFRNONAA 74 (L) 07/31/2013 1845   GFRAA 86 (L) 07/31/2013 1845     ASSESSMENT & PLAN:46 yo perimenopausal female    Anemia, secondary to Iron and B12 deficiency  -We reviewed her medical record in detail with the patient. She has h/o mild intermittent since at least 2011, with Hgb in the 10-11 range until 05/2021 when she was found to have Hgb 9.8 (around time she was diagnosed with diverticulitis) with evidence of IDA and B12 deficiency  -She started oral iron and monthly B12 injections in 05/2021, she tolerates well overall.  -she is perimenopausal and having lighter periods, likely not the source of IDA. We recommend GI work up to further evaluate. We have referred her to [facility] per pt request.  -Due to her RA, we reviewed the potential of auto-immune cause. Intrinsic factor antibodies are normal.  -Melissa Massey presents with symptomatic anemia, mainly fatigue, dizziness, exertional dyspnea, and palpitations.  -Labs at this consult show Hgb 10.1, ferritin 5, serum iron 16, 4% sat, and high TIBC 452, and reticulocyte hgb 24 which is consistent with IDA. B12 is low at 190 despite monthly injections for past 4 months.  -She did not respond well to oral iron or monthly B12 injection.  -We recommended loading dose B12 and IV Iron weekly x3 -Ferritin improved from 5 to 32, normal, after IV iron; and CBC  normalized    Neuropathy -Likely secondary to B12 deficiency and DM -Will see if this improves with adequate B12 replacement -continue monitoring   3. Family history -We discussed her family history; mother has h/o IDA -She had a sister with neuroblastoma at 80 months old who is deceased; PGM had breast and ovarian cancer, and that person's daughter (pt's paternal aunt) had breast cancer in her 75's.  -Her father, our patient, unfortunately had an aggressive rare tumor and passed away quickly -Pt's genetic test was negative    4. Co-morbidities: asthma, ADHD, anxiety/depression, NASH, OSA, PCOS, RA, steroid induced DM, and diverticulitis  -Continue med regimen and management per PCP -Upcoming CT chest to monitor a lung nodule     PLAN:  No orders of the defined types were placed in this encounter.     All questions were answered. The patient knows to call the clinic with any problems, questions or concerns. No barriers to learning were detected. I spent *** counseling the patient face to face. The total time spent in the appointment was *** and more than 50% was on counseling, review of test results, and coordination of care.   Cira Rue, NP-C @DATE @

## 2022-04-16 ENCOUNTER — Inpatient Hospital Stay: Payer: 59

## 2022-04-16 ENCOUNTER — Encounter: Payer: Self-pay | Admitting: Nurse Practitioner

## 2022-04-16 ENCOUNTER — Other Ambulatory Visit: Payer: 59

## 2022-04-16 ENCOUNTER — Inpatient Hospital Stay: Payer: 59 | Attending: Hematology | Admitting: Nurse Practitioner

## 2022-04-27 DIAGNOSIS — H15001 Unspecified scleritis, right eye: Secondary | ICD-10-CM | POA: Diagnosis not present

## 2022-05-31 ENCOUNTER — Telehealth: Payer: Self-pay | Admitting: Nurse Practitioner

## 2022-06-13 ENCOUNTER — Telehealth: Payer: Self-pay | Admitting: Nurse Practitioner

## 2022-06-13 NOTE — Telephone Encounter (Signed)
Contacted patient to scheduled appointments. Patient is aware of appointments that are scheduled.   

## 2022-06-13 NOTE — Telephone Encounter (Signed)
Pharmacy sent refill request for glipizide please send to the [facility]

## 2022-06-14 ENCOUNTER — Other Ambulatory Visit: Payer: Self-pay

## 2022-06-14 MED ORDER — GLIPIZIDE 5 MG PO TABS: 5.0000 mg | ORAL_TABLET | Freq: Two times a day (BID) | ORAL | 3 refills | Status: DC

## 2022-06-18 DIAGNOSIS — K0490 Unspecified diseases of pulp and periapical tissues: Secondary | ICD-10-CM | POA: Diagnosis not present

## 2022-06-20 NOTE — Telephone Encounter (Signed)
I left message for pt to see what was decided

## 2022-06-23 ENCOUNTER — Other Ambulatory Visit: Payer: Self-pay | Admitting: Nurse Practitioner

## 2022-06-23 DIAGNOSIS — E1165 Type 2 diabetes mellitus with hyperglycemia: Secondary | ICD-10-CM

## 2022-06-23 DIAGNOSIS — M0579 Rheumatoid arthritis with rheumatoid factor of multiple sites without organ or systems involvement: Secondary | ICD-10-CM

## 2022-06-25 NOTE — Telephone Encounter (Signed)
Refill request last apt 02/04/22

## 2022-06-26 DIAGNOSIS — M791 Myalgia, unspecified site: Secondary | ICD-10-CM | POA: Diagnosis not present

## 2022-06-26 DIAGNOSIS — H15001 Unspecified scleritis, right eye: Secondary | ICD-10-CM | POA: Diagnosis not present

## 2022-06-26 DIAGNOSIS — Z6841 Body Mass Index (BMI) 40.0 and over, adult: Secondary | ICD-10-CM | POA: Diagnosis not present

## 2022-06-26 DIAGNOSIS — H04123 Dry eye syndrome of bilateral lacrimal glands: Secondary | ICD-10-CM | POA: Diagnosis not present

## 2022-06-26 DIAGNOSIS — M0579 Rheumatoid arthritis with rheumatoid factor of multiple sites without organ or systems involvement: Secondary | ICD-10-CM | POA: Diagnosis not present

## 2022-06-26 DIAGNOSIS — D508 Other iron deficiency anemias: Secondary | ICD-10-CM | POA: Diagnosis not present

## 2022-06-27 ENCOUNTER — Other Ambulatory Visit (HOSPITAL_BASED_OUTPATIENT_CLINIC_OR_DEPARTMENT_OTHER): Payer: Self-pay | Admitting: Chiropractic Medicine

## 2022-06-27 ENCOUNTER — Ambulatory Visit (HOSPITAL_BASED_OUTPATIENT_CLINIC_OR_DEPARTMENT_OTHER)
Admission: RE | Admit: 2022-06-27 | Discharge: 2022-06-27 | Disposition: A | Discharge status: Home / Self Care | Payer: 59 | Source: Ambulatory Visit | Attending: Chiropractic Medicine | Admitting: Chiropractic Medicine

## 2022-06-27 DIAGNOSIS — R52 Pain, unspecified: Secondary | ICD-10-CM | POA: Insufficient documentation

## 2022-06-27 DIAGNOSIS — M50323 Other cervical disc degeneration at C6-C7 level: Secondary | ICD-10-CM | POA: Diagnosis not present

## 2022-06-27 DIAGNOSIS — M9905 Segmental and somatic dysfunction of pelvic region: Secondary | ICD-10-CM | POA: Diagnosis not present

## 2022-06-27 DIAGNOSIS — R102 Pelvic and perineal pain: Secondary | ICD-10-CM | POA: Diagnosis not present

## 2022-06-27 DIAGNOSIS — M9902 Segmental and somatic dysfunction of thoracic region: Secondary | ICD-10-CM | POA: Diagnosis not present

## 2022-06-27 DIAGNOSIS — M62411 Contracture of muscle, right shoulder: Secondary | ICD-10-CM | POA: Diagnosis not present

## 2022-06-27 DIAGNOSIS — M9901 Segmental and somatic dysfunction of cervical region: Secondary | ICD-10-CM | POA: Diagnosis not present

## 2022-06-27 DIAGNOSIS — M5134 Other intervertebral disc degeneration, thoracic region: Secondary | ICD-10-CM | POA: Diagnosis not present

## 2022-06-27 DIAGNOSIS — M9903 Segmental and somatic dysfunction of lumbar region: Secondary | ICD-10-CM | POA: Diagnosis not present

## 2022-06-27 DIAGNOSIS — M533 Sacrococcygeal disorders, not elsewhere classified: Secondary | ICD-10-CM | POA: Diagnosis not present

## 2022-06-27 DIAGNOSIS — Z981 Arthrodesis status: Secondary | ICD-10-CM | POA: Diagnosis not present

## 2022-06-29 ENCOUNTER — Other Ambulatory Visit: Payer: Self-pay | Admitting: Nurse Practitioner

## 2022-06-29 DIAGNOSIS — M9901 Segmental and somatic dysfunction of cervical region: Secondary | ICD-10-CM | POA: Diagnosis not present

## 2022-06-29 DIAGNOSIS — G4710 Hypersomnia, unspecified: Secondary | ICD-10-CM

## 2022-06-29 DIAGNOSIS — F900 Attention-deficit hyperactivity disorder, predominantly inattentive type: Secondary | ICD-10-CM

## 2022-06-29 DIAGNOSIS — M9905 Segmental and somatic dysfunction of pelvic region: Secondary | ICD-10-CM | POA: Diagnosis not present

## 2022-06-29 DIAGNOSIS — M62411 Contracture of muscle, right shoulder: Secondary | ICD-10-CM | POA: Diagnosis not present

## 2022-06-29 DIAGNOSIS — M9903 Segmental and somatic dysfunction of lumbar region: Secondary | ICD-10-CM | POA: Diagnosis not present

## 2022-06-29 DIAGNOSIS — M9902 Segmental and somatic dysfunction of thoracic region: Secondary | ICD-10-CM | POA: Diagnosis not present

## 2022-07-02 MED ORDER — AMPHETAMINE-DEXTROAMPHET ER 20 MG PO CP24: 40.0000 mg | ORAL_CAPSULE | Freq: Every day | ORAL | 0 refills | Status: DC

## 2022-07-08 NOTE — Progress Notes (Unsigned)
Patient Care Team: Early, Sung Amabile, NP as PCP - General (Nurse Practitioner)   CHIEF COMPLAINT: Follow up iron and B12 deficiencies   CURRENT THERAPY: IV iron as needed (last 10/2021) and monthly B12 inj (last received 12/2021)  INTERVAL HISTORY Ms. Leete returns for follow up as scheduled. Last seen by me for initial visit 09/2021. This is a rescheduled visit originally on 04/16/22. She has been more anxious than depressed since her father (our patient) passed in December. Her step mom is moving to [place] and she got guardianship of her adult brother who has special needs. Additionally, her mother in law is also a patient of ours. She has not been able to keep up with her health or provider visits. She is asking for mental health referral today.   She is exhausted. On prednisone for scleritis. Also working with rheumatology to get Rituxan approved. Having hot flashes, no bleeding. Denies recent infection. Chiropractor is helping with neck/back pain. About 3 months ago she noticed a lump on her right back/flank which is growing. She notes it "pinches" when she twists to the left.   ROS   Past Medical History:  Diagnosis Date   Arthritis    Asthma    CHF (congestive heart failure) (HCC)    Diabetes mellitus without complication (HCC)    DVT (deep venous thrombosis) (HCC)    Graves disease    Hyperlipidemia    Hypertension    PCOS (polycystic ovarian syndrome)    Rheumatoid arthritis (HCC)      Past Surgical History:  Procedure Laterality Date   CESAREAN SECTION     x2   LUMBAR FUSION     TUBAL LIGATION       Outpatient Encounter Medications as of 07/09/2022  Medication Sig   albuterol (PROVENTIL) (2.5 MG/3ML) 0.083% nebulizer solution Take 3 mLs (2.5 mg total) by nebulization every 4 (four) hours as needed for wheezing or shortness of breath (please include nebulizer machine, hoses, and mask if needed.).   albuterol (VENTOLIN HFA) 108 (90 Base) MCG/ACT inhaler INHALE 2 PUFFS  BY MOUTH EVERY 6 HOURS AS NEEDED FOR WHEEZING AND FOR SHORTNESS OF BREATH   ALPRAZolam (XANAX) 0.5 MG tablet Take 0.5-1 tablets (0.25-0.5 mg total) by mouth 2 (two) times daily as needed for anxiety.   amphetamine-dextroamphetamine (ADDERALL XR) 20 MG 24 hr capsule Take 2 capsules (40 mg total) by mouth every morning.   amphetamine-dextroamphetamine (ADDERALL XR) 20 MG 24 hr capsule Take 2 capsules (40 mg total) by mouth every morning.   amphetamine-dextroamphetamine (ADDERALL XR) 20 MG 24 hr capsule Take 2 capsules (40 mg total) by mouth daily.   BREO ELLIPTA 200-25 MCG/ACT AEPB Inhale 1 puff into the lungs daily.   celecoxib (CELEBREX) 200 MG capsule Take 1 capsule by mouth twice daily   Continuous Blood Gluc Sensor (DEXCOM G7 SENSOR) MISC Apply one device every 10 days as directed for continuous glucose measurement.   cyanocobalamin (VITAMIN B12) 1000 MCG/ML injection Inject 1 mL (1,000 mcg total) into the muscle every 30 (thirty) days.   cyclobenzaprine (FLEXERIL) 5 MG tablet Take 5 mg by mouth at bedtime as needed for muscle spasms.   esomeprazole (NEXIUM) 40 MG capsule Take 40 mg by mouth 2 (two) times daily.   etanercept (ENBREL SURECLICK) 50 MG/ML injection 50mg    Fluticasone-Umeclidin-Vilant (TRELEGY ELLIPTA) 200-62.5-25 MCG/ACT AEPB Inhale 1 puff into the lungs daily.   glipiZIDE (GLUCOTROL) 5 MG tablet Take 1 tablet (5 mg total) by mouth 2 (  two) times daily before a meal.   hydrochlorothiazide (HYDRODIURIL) 25 MG tablet Take 25 mg by mouth daily.   insulin degludec (TRESIBA FLEXTOUCH) 100 UNIT/ML FlexTouch Pen Inject 10 Units into the skin at bedtime. Increase by 2 units every 4 days for blood sugar higher than 150 fasting in the morning   Iron, Ferrous Sulfate, 325 (65 Fe) MG TABS Take 325 mg by mouth daily.   levothyroxine (SYNTHROID) 125 MCG tablet Take 250 mcg by mouth daily.   liothyronine (CYTOMEL) 5 MCG tablet Take 5 mcg by mouth 2 (two) times daily.   losartan (COZAAR) 100 MG  tablet Take 1 tablet (100 mg total) by mouth daily.   metFORMIN (GLUCOPHAGE-XR) 500 MG 24 hr tablet TAKE 2 TABLETS BY MOUTH IN THE MORNING AND 2 AT BEDTIME   metoprolol tartrate (LOPRESSOR) 100 MG tablet Take 2 hours prior to ct scan   ondansetron (ZOFRAN) 8 MG tablet Take 1 tablet (8 mg total) by mouth every 8 (eight) hours as needed for nausea or vomiting.   predniSONE (DELTASONE) 20 MG tablet Take by mouth.   rosuvastatin (CRESTOR) 40 MG tablet Take 1 tablet (40 mg total) by mouth daily.   traZODone (DESYREL) 100 MG tablet Take 1 tablet (100 mg total) by mouth at bedtime as needed for sleep.   venlafaxine XR (EFFEXOR XR) 75 MG 24 hr capsule Take 1 capsule (75 mg total) by mouth daily with breakfast. Take with 150mg  to make 225mg  total daily.   venlafaxine XR (EFFEXOR-XR) 150 MG 24 hr capsule Take 1 capsule (150 mg total) by mouth daily with breakfast. Take with 75mg  to make 225mg  daily.   Vitamin D, Ergocalciferol, (DRISDOL) 1.25 MG (50000 UNIT) CAPS capsule Take 50,000 Units by mouth every Sunday.   [DISCONTINUED] montelukast (SINGULAIR) 10 MG tablet Take 10 mg by mouth daily.   [DISCONTINUED] promethazine (PHENERGAN) 25 MG tablet Take 1 tablet (25 mg total) by mouth every 8 (eight) hours as needed for nausea or vomiting.   [DISCONTINUED] tirzepatide [facility]) 2.5 MG/0.5ML Pen Inject 2.5 mg into the skin once a week.   Facility-Administered Encounter Medications as of 07/09/2022  Medication   cyanocobalamin ((VITAMIN B-12)) injection 1,000 mcg   cyanocobalamin ((VITAMIN B-12)) injection 1,000 mcg   cyanocobalamin (VITAMIN B12) injection 1,000 mcg     Today's Vitals   07/09/22 0911  BP: 128/89  Pulse: 81  Resp: 20  Temp: 98.6 F (37 C)  TempSrc: Oral  SpO2: 96%  Weight: 288 lb 12.8 oz (131 kg)   Body mass index is 46.61 kg/m.   PHYSICAL EXAM GENERAL:alert, no distress and comfortable SKIN: no rash  EYES: sclera clear NECK: without mass LYMPH:  no palpable cervical or  supraclavicular lymphadenopathy  LUNGS: clear with normal breathing effort HEART: regular rate & rhythm, no lower extremity edema ABDOMEN: abdomen soft, non-tender and normal bowel sounds. Soft tissue mass at R flank NEURO: alert & oriented x 3 with fluent speech  CBC    Component Value Date/Time   WBC 10.9 (H) 07/09/2022 0850   WBC 7.0 07/31/2013 1345   RBC 4.31 07/09/2022 0850   HGB 12.2 07/09/2022 0850   HGB 9.8 (L) 05/30/2021 1343   HCT 37.9 07/09/2022 0850   HCT 31.8 (L) 05/30/2021 1343   PLT 352 07/09/2022 0850   PLT 389 05/30/2021 1343   MCV 87.9 07/09/2022 0850   MCV 80 05/30/2021 1343   MCH 28.3 07/09/2022 0850   MCHC 32.2 07/09/2022 0850   RDW 16.0 (H)  07/09/2022 0850   RDW 16.9 (H) 05/30/2021 1343   LYMPHSABS 3.4 07/09/2022 0850   LYMPHSABS 1.0 05/30/2021 1343   MONOABS 0.8 07/09/2022 0850   EOSABS 0.1 07/09/2022 0850   EOSABS 0.1 05/30/2021 1343   BASOSABS 0.0 07/09/2022 0850   BASOSABS 0.0 05/30/2021 1343     CMP     Component Value Date/Time   NA 138 05/30/2021 1343   K 4.2 05/30/2021 1343   CL 99 05/30/2021 1343   CO2 20 05/30/2021 1343   GLUCOSE 301 (H) 05/30/2021 1343   GLUCOSE 80 07/31/2013 1845   BUN 10 05/30/2021 1343   CREATININE 0.67 05/30/2021 1343   CALCIUM 9.0 05/30/2021 1343   PROT 6.3 05/30/2021 1343   ALBUMIN 4.1 05/30/2021 1343   AST 21 05/30/2021 1343   ALT 35 (H) 05/30/2021 1343   ALKPHOS 91 05/30/2021 1343   BILITOT 0.2 05/30/2021 1343   GFRNONAA 74 (L) 07/31/2013 1845   GFRAA 86 (L) 07/31/2013 1845     ASSESSMENT & PLAN:47 yo perimenopausal female    Anemia, secondary to Iron and B12 deficiency  -She has h/o mild intermittent since at least 2011, with Hgb in the 10-11 range until 05/2021 when she was found to have Hgb 9.8 (around time she was diagnosed with diverticulitis) with evidence of IDA and B12 deficiency  -She started oral iron and monthly B12 injections in 05/2021, she tolerates well overall.  -she is perimenopausal  and having lighter periods, likely not the source of IDA. We recommended GI work up to further evaluate, but has not been done yet -Due to her RA, we reviewed the potential of auto-immune cause. Intrinsic factor antibodies are normal.  -Labs 10/18/21 show Hgb 10.1, ferritin 5, erum iron 16, 4% sat, and high TIBC 452, and reticulocyte hgb 24 which is consistent with IDA. B12 is low at 190 despite monthly injections for past 4 months. Unfortunately she has not responded well to oral iron or monthly B12 inj.  -She received loading dose B12 and IV iron x3 in 10/2021 -Her father was diagnosed with aggressive cancer around that time and she lost f/up with [place]. She has continued oral iron but has not received B12 since 01/19/22.  -Ms. Molinelli appears stable. She is very fatigued, likely multifactorial. CBC shows no anemia, and iron/TIBC are normal. Ferritin is pending -will arrange IV iron if needed -B12 level dropped to 155 while off inj for 6 months. She will resume monthly B12 today then continue at home per her request. My nurse did teaching -She is very anxious and sad being here at the cancer center since her father's passing. I offered to ask her PCP follow labs and will see her back as needed for IV iron. She would like that. I will discuss with Enid Skeens, NP -F/up open  2. Soft tissue nodule R back/flank -Onset ~3 months, enlarging. She has had lipomas in the past -exam shows a soft tissue density/mass at the R back/flank -Will obtain US, I will call her with results   3. Neuropathy -Likely secondary to B12 deficiency and DM -Will see if this improves with adequate B12 replacement -continue monitoring -not discussed today   4. Family history -We discussed her family history; mother has h/o IDA -She had a sister with neuroblastoma at 17 months old who is deceased; PGM had breast and ovarian cancer, and that person's daughter (pt's paternal aunt) had breast cancer in her 23's.  -Her father was  our patient, rare aggressive  cancer -she previously had genetic testing which was normal   5. Co-morbidities: asthma, ADHD, anxiety/depression, NASH, OSA, PCOS, RA, steroid induced DM, and diverticulitis  -Continue med regimen and management per PCP -Anxiety/depression worsened after her father passed away 6 months ago. Having panic attacks. Continue high dose effexor 225 mg daily and xanax PRN -Emotional support given. I will place urgent referral to [facility] and Dr. Bosie Clos. Pt agrees   PLAN: -Labs reviewed, ferritin is pending. Will arrange IV iron if needed -Resume B12 inj today, then pt will continue monthly at home -Urgent referral to SW and psych, per pt request -[place] ABD limited, R flank to evaluate soft tissue mass in 1 week, I will call with results -Will ask PCP Enid Skeens, NP to take over labs, with CBC, B12, iron/TIBC, and ferritin q6 months. Will arrange IV iron here if needed -Very stressful for pt to be here since her father's passing   Orders Placed This Encounter  Procedures   US Abdomen Limited    Standing Status:   Future    Standing Expiration Date:   07/09/2023    Order Specific Question:   Reason for Exam (SYMPTOM  OR DIAGNOSIS REQUIRED)    Answer:   3 mo h/o enlarging soft tissue mass at R flank; non-renal    Order Specific Question:   Preferred imaging location?    Answer:   [facility]   Ambulatory referral to Social Work    Referral Priority:   Urgent    Referral Type:   Consultation    Referral Reason:   Specialty Services Required    Referred to Provider:   Rachel Moulds, LCSW    Number of Visits Requested:   1   Ambulatory referral to Psychiatry    Referral Priority:   Routine    Referral Type:   Psychiatric    Referral Reason:   Specialty Services Required    Requested Specialty:   Psychiatry    Number of Visits Requested:   1      All questions were answered. The patient knows to call the clinic with any problems, questions or  concerns. No barriers to learning were detected. I spent 20 minutes counseling the patient face to face. The total time spent in the appointment was 30 minutes and more than 50% was on counseling, review of test results, and coordination of care.   Santiago Glad, NP-C 07/09/22

## 2022-07-09 ENCOUNTER — Other Ambulatory Visit: Payer: Self-pay

## 2022-07-09 ENCOUNTER — Encounter: Payer: Self-pay | Admitting: Nurse Practitioner

## 2022-07-09 ENCOUNTER — Inpatient Hospital Stay: Payer: 59 | Admitting: Nurse Practitioner

## 2022-07-09 ENCOUNTER — Inpatient Hospital Stay: Payer: 59 | Attending: Hematology

## 2022-07-09 ENCOUNTER — Inpatient Hospital Stay: Payer: 59

## 2022-07-09 VITALS — BP 128/89 | HR 81 | Temp 98.6°F | Resp 20 | Wt 288.8 lb

## 2022-07-09 DIAGNOSIS — E611 Iron deficiency: Secondary | ICD-10-CM | POA: Diagnosis not present

## 2022-07-09 DIAGNOSIS — M7989 Other specified soft tissue disorders: Secondary | ICD-10-CM | POA: Diagnosis not present

## 2022-07-09 DIAGNOSIS — R4589 Other symptoms and signs involving emotional state: Secondary | ICD-10-CM

## 2022-07-09 DIAGNOSIS — D509 Iron deficiency anemia, unspecified: Secondary | ICD-10-CM | POA: Insufficient documentation

## 2022-07-09 DIAGNOSIS — E538 Deficiency of other specified B group vitamins: Secondary | ICD-10-CM

## 2022-07-09 LAB — CBC WITH DIFFERENTIAL (CANCER CENTER ONLY)
Abs Immature Granulocytes: 0.04 10*3/uL (ref 0.00–0.07)
Basophils Absolute: 0 10*3/uL (ref 0.0–0.1)
Basophils Relative: 0 %
Eosinophils Absolute: 0.1 10*3/uL (ref 0.0–0.5)
Eosinophils Relative: 1 %
HCT: 37.9 % (ref 36.0–46.0)
Hemoglobin: 12.2 g/dL (ref 12.0–15.0)
Immature Granulocytes: 0 %
Lymphocytes Relative: 32 %
Lymphs Abs: 3.4 10*3/uL (ref 0.7–4.0)
MCH: 28.3 pg (ref 26.0–34.0)
MCHC: 32.2 g/dL (ref 30.0–36.0)
MCV: 87.9 fL (ref 80.0–100.0)
Monocytes Absolute: 0.8 10*3/uL (ref 0.1–1.0)
Monocytes Relative: 7 %
Neutro Abs: 6.5 10*3/uL (ref 1.7–7.7)
Neutrophils Relative %: 60 %
Platelet Count: 352 10*3/uL (ref 150–400)
RBC: 4.31 MIL/uL (ref 3.87–5.11)
RDW: 16 % — ABNORMAL HIGH (ref 11.5–15.5)
WBC Count: 10.9 10*3/uL — ABNORMAL HIGH (ref 4.0–10.5)
nRBC: 0 % (ref 0.0–0.2)

## 2022-07-09 LAB — IRON AND IRON BINDING CAPACITY (CC-WL,HP ONLY)
Iron: 48 ug/dL (ref 28–170)
Saturation Ratios: 12 % (ref 10.4–31.8)
TIBC: 403 ug/dL (ref 250–450)
UIBC: 355 ug/dL (ref 148–442)

## 2022-07-09 LAB — VITAMIN B12: Vitamin B-12: 155 pg/mL — ABNORMAL LOW (ref 180–914)

## 2022-07-09 LAB — FERRITIN: Ferritin: 6 ng/mL — ABNORMAL LOW (ref 11–307)

## 2022-07-09 MED ORDER — CYANOCOBALAMIN 1000 MCG/ML IJ SOLN
1000.0000 ug | Freq: Once | INTRAMUSCULAR | Status: AC
  Administered 2022-07-09: 1000 ug via INTRAMUSCULAR
  Filled 2022-07-09: qty 1

## 2022-07-09 MED ORDER — CYANOCOBALAMIN 1000 MCG/ML IJ SOLN
1000.0000 ug | INTRAMUSCULAR | 6 refills | Status: AC
Start: 2022-07-09 — End: ?

## 2022-07-09 NOTE — Patient Instructions (Signed)
Vitamin B12 Deficiency Vitamin B12 deficiency occurs when the body does not have enough of this important vitamin. The body needs this vitamin: To make red blood cells. To make DNA. This is the genetic material inside cells. To help the nerves work properly so they can carry messages from the brain to the body. Vitamin B12 deficiency can cause health problems, such as not having enough red blood cells in the blood (anemia). This can lead to nerve damage if untreated. What are the causes? This condition may be caused by: Not eating enough foods that contain vitamin B12. Not having enough stomach acid and digestive fluids to properly absorb vitamin B12 from the food that you eat. Having certain diseases that make it hard to absorb vitamin B12. These diseases include Crohn's disease, chronic pancreatitis, and cystic fibrosis. An autoimmune disorder in which the body does not make enough of a protein (intrinsic factor) within the stomach, resulting in not enough absorption of vitamin B12. Having a surgery in which part of the stomach or small intestine is removed. Taking certain medicines that make it hard for the body to absorb vitamin B12. These include: Heartburn medicines, such as antacids and proton pump inhibitors. Some medicines that are used to treat diabetes. What increases the risk? The following factors may make you more likely to develop a vitamin B12 deficiency: Being an older adult. Eating a vegetarian or vegan diet that does not include any foods that come from animals. Eating a poor diet while you are pregnant. Taking certain medicines. Having alcoholism. What are the signs or symptoms? In some cases, there are no symptoms of this condition. If the condition leads to anemia or nerve damage, various symptoms may occur, such as: Weakness. Tiredness (fatigue). Loss of appetite. Numbness or tingling in your hands and feet. Redness and burning of the tongue. Depression,  confusion, or memory problems. Trouble walking. If anemia is severe, symptoms can include: Shortness of breath. Dizziness. Rapid heart rate. How is this diagnosed? This condition may be diagnosed with a blood test to measure the level of vitamin B12 in your blood. You may also have other tests, including: A group of tests that measure certain characteristics of blood cells (complete blood count, CBC). A blood test to measure intrinsic factor. A procedure where a thin tube with a camera on the end is used to look into your stomach or intestines (endoscopy). Other tests may be needed to discover the cause of the deficiency. How is this treated? Treatment for this condition depends on the cause. This condition may be treated by: Changing your eating and drinking habits, such as: Eating more foods that contain vitamin B12. Drinking less alcohol or no alcohol. Getting vitamin B12 injections. Taking vitamin B12 supplements by mouth (orally). Your health care provider will tell you which dose is best for you. Follow these instructions at home: Eating and drinking  Include foods in your diet that come from animals and contain a lot of vitamin B12. These include: Meats and poultry. This includes beef, pork, chicken, turkey, and organ meats, such as liver. Seafood. This includes clams, rainbow trout, salmon, tuna, and haddock. Eggs. Dairy foods such as milk, yogurt, and cheese. Eat foods that have vitamin B12 added to them (are fortified), such as ready-to-eat breakfast cereals. Check the label on the package to see if a food is fortified. The items listed above may not be a complete list of foods and beverages you can eat and drink. Contact a dietitian for   more information. Alcohol use Do not drink alcohol if: Your health care provider tells you not to drink. You are pregnant, may be pregnant, or are planning to become pregnant. If you drink alcohol: Limit how much you have to: 0-1 drink a  day for women. 0-2 drinks a day for men. Know how much alcohol is in your drink. In the [place] one drink equals one 12 oz bottle of beer (355 mL), one 5 oz glass of wine (148 mL), or one 1 oz glass of hard liquor (44 mL). General instructions Get vitamin B12 injections if told to by your health care provider. Take supplements only as told by your health care provider. Follow the directions carefully. Keep all follow-up visits. This is important. Contact a health care provider if: Your symptoms come back. Your symptoms get worse or do not improve with treatment. Get help right away: You develop shortness of breath. You have a rapid heart rate. You have chest pain. You become dizzy or you faint. These symptoms may be an emergency. Get help right away. Call 911. Do not wait to see if the symptoms will go away. Do not drive yourself to the hospital. Summary Vitamin B12 deficiency occurs when the body does not have enough of this important vitamin. Common causes include not eating enough foods that contain vitamin B12, not being able to absorb vitamin B12 from the food that you eat, having a surgery in which part of the stomach or small intestine is removed, or taking certain medicines. Eat foods that have vitamin B12 in them. Treatment may include making a change in the way you eat and drink, getting vitamin B12 injections, or taking vitamin B12 supplements. This information is not intended to replace advice given to you by your health care provider. Make sure you discuss any questions you have with your health care provider. Document Revised: 09/02/2020 Document Reviewed: 09/02/2020 Elsevier Patient Education  2024 Elsevier Inc.  

## 2022-07-10 ENCOUNTER — Inpatient Hospital Stay: Payer: 59 | Admitting: Licensed Clinical Social Worker

## 2022-07-10 ENCOUNTER — Encounter: Payer: Self-pay | Admitting: Nurse Practitioner

## 2022-07-10 DIAGNOSIS — E611 Iron deficiency: Secondary | ICD-10-CM

## 2022-07-10 NOTE — Progress Notes (Signed)
[facility] CSW Progress Note  Clinical Child psychotherapist contacted patient by phone to discuss referral for behavioral health support.  Pt has experienced the loss of both parents over the course of a few months which has significantly impacted pt's life.  Pt stopped working to provide care for her parents when they became ill and has now taken over guardianship of her 48 y/o brother who has special needs.  Pt has four children of her own and multiple medical issues.  Pt referred to the cancer center for hematology services.  It is difficult for pt to come to the cancer center for treatment as it represents the loss of her father.  Pt states she has experienced extreme anxiety and depression for which she is seeking support.  Pt's PCP has prescribed medication for both.  CSW suggested pt see a psychiatrist to better manage these medications and to make changes if necessary.  Pt could also see a counselor from the same practice, allowing the two to collaborate to better manage pt's care.  Pt given contact information for [facility] to book an intake.  Pt also emailed a list of counselors pt can look into once she is more stable to establish long term emotional support.  CSW to remain available as appropriate to provide support.      Rachel Moulds, LCSW Clinical Social Worker [facility]

## 2022-07-13 ENCOUNTER — Telehealth: Payer: Self-pay | Admitting: Nurse Practitioner

## 2022-07-13 NOTE — Telephone Encounter (Signed)
Patient is going to contact provider regarding infusion treatments/treatment plans.

## 2022-07-16 ENCOUNTER — Telehealth: Payer: Self-pay | Admitting: Hematology

## 2022-07-16 ENCOUNTER — Ambulatory Visit (HOSPITAL_BASED_OUTPATIENT_CLINIC_OR_DEPARTMENT_OTHER)
Admission: RE | Admit: 2022-07-16 | Discharge: 2022-07-16 | Disposition: A | Discharge status: Home / Self Care | Payer: 59 | Source: Ambulatory Visit | Attending: Nurse Practitioner | Admitting: Nurse Practitioner

## 2022-07-16 DIAGNOSIS — M7989 Other specified soft tissue disorders: Secondary | ICD-10-CM | POA: Diagnosis not present

## 2022-07-16 DIAGNOSIS — R1900 Intra-abdominal and pelvic swelling, mass and lump, unspecified site: Secondary | ICD-10-CM | POA: Diagnosis not present

## 2022-07-16 NOTE — Telephone Encounter (Signed)
Contacted patient to scheduled appointments. Patient is aware of appointments that are scheduled.   

## 2022-07-17 ENCOUNTER — Encounter (HOSPITAL_COMMUNITY): Payer: Self-pay | Admitting: Psychiatry

## 2022-07-17 ENCOUNTER — Ambulatory Visit (HOSPITAL_COMMUNITY): Payer: 59 | Admitting: Psychiatry

## 2022-07-17 VITALS — BP 152/95 | HR 77 | Ht 66.0 in | Wt 287.0 lb

## 2022-07-17 DIAGNOSIS — F339 Major depressive disorder, recurrent, unspecified: Secondary | ICD-10-CM

## 2022-07-17 DIAGNOSIS — F411 Generalized anxiety disorder: Secondary | ICD-10-CM | POA: Diagnosis not present

## 2022-07-17 DIAGNOSIS — F4321 Adjustment disorder with depressed mood: Secondary | ICD-10-CM | POA: Diagnosis not present

## 2022-07-17 DIAGNOSIS — F332 Major depressive disorder, recurrent severe without psychotic features: Secondary | ICD-10-CM | POA: Diagnosis not present

## 2022-07-17 MED ORDER — FLUOXETINE HCL 10 MG PO CAPS: 10.0000 mg | ORAL_CAPSULE | Freq: Every day | ORAL | 0 refills | Status: DC
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
## 2022-07-19 ENCOUNTER — Ambulatory Visit (INDEPENDENT_AMBULATORY_CARE_PROVIDER_SITE_OTHER): Payer: 59 | Admitting: Licensed Clinical Social Worker

## 2022-07-19 ENCOUNTER — Encounter (HOSPITAL_COMMUNITY): Payer: Self-pay

## 2022-07-19 DIAGNOSIS — F331 Major depressive disorder, recurrent, moderate: Secondary | ICD-10-CM

## 2022-07-19 DIAGNOSIS — F411 Generalized anxiety disorder: Secondary | ICD-10-CM | POA: Diagnosis not present
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
## 2022-07-20 NOTE — Telephone Encounter (Signed)
error 

## 2022-07-23 DIAGNOSIS — M0579 Rheumatoid arthritis with rheumatoid factor of multiple sites without organ or systems involvement: Secondary | ICD-10-CM | POA: Diagnosis not present

## 2022-07-24 ENCOUNTER — Telehealth: Payer: Self-pay | Admitting: Internal Medicine

## 2022-07-24 DIAGNOSIS — M0579 Rheumatoid arthritis with rheumatoid factor of multiple sites without organ or systems involvement: Secondary | ICD-10-CM | POA: Diagnosis not present

## 2022-07-24 MED ORDER — HYDROCHLOROTHIAZIDE 25 MG PO TABS: 25.0000 mg | ORAL_TABLET | Freq: Every day | ORAL | 0 refills | Status: DC

## 2022-07-24 NOTE — Telephone Encounter (Signed)
Refill request for hydrochlorothiazide 25mg  to [facility]

## 2022-07-25 ENCOUNTER — Telehealth: Payer: Self-pay | Admitting: Nurse Practitioner

## 2022-07-25 DIAGNOSIS — J4542 Moderate persistent asthma with status asthmaticus: Secondary | ICD-10-CM

## 2022-07-25 MED ORDER — ALBUTEROL SULFATE HFA 108 (90 BASE) MCG/ACT IN AERS: INHALATION_SPRAY | RESPIRATORY_TRACT | 1 refills | Status: DC

## 2022-07-25 NOTE — Telephone Encounter (Signed)
Pharmacy sent refill request for albuterol Please send to the [facility] - [place], [place] - [address]

## 2022-07-31 ENCOUNTER — Other Ambulatory Visit: Payer: Self-pay | Admitting: Nurse Practitioner

## 2022-07-31 ENCOUNTER — Inpatient Hospital Stay: Payer: 59 | Attending: Hematology

## 2022-07-31 VITALS — BP 134/84 | HR 74 | Temp 98.7°F | Resp 13

## 2022-07-31 DIAGNOSIS — D509 Iron deficiency anemia, unspecified: Secondary | ICD-10-CM | POA: Diagnosis not present

## 2022-07-31 DIAGNOSIS — E538 Deficiency of other specified B group vitamins: Secondary | ICD-10-CM | POA: Diagnosis not present

## 2022-07-31 MED ORDER — SODIUM CHLORIDE 0.9 % IV SOLN: INTRAVENOUS | Status: DC

## 2022-07-31 MED ORDER — LORATADINE 10 MG PO TABS
10.0000 mg | ORAL_TABLET | Freq: Once | ORAL | Status: AC
  Administered 2022-07-31: 10 mg via ORAL
  Filled 2022-07-31: qty 1

## 2022-07-31 MED ORDER — SODIUM CHLORIDE 0.9 % IV SOLN
400.0000 mg | Freq: Once | INTRAVENOUS | Status: AC
  Administered 2022-07-31: 400 mg via INTRAVENOUS
  Filled 2022-07-31: qty 400

## 2022-07-31 NOTE — Patient Instructions (Signed)

## 2022-07-31 NOTE — Progress Notes (Signed)
Patient agreed to remain for 15 min post infusion.  Tolerated treatment well without incident.  VSS at discharge.  Ambulated to lobby.

## 2022-08-06 DIAGNOSIS — Z79899 Other long term (current) drug therapy: Secondary | ICD-10-CM | POA: Diagnosis not present

## 2022-08-06 DIAGNOSIS — M0579 Rheumatoid arthritis with rheumatoid factor of multiple sites without organ or systems involvement: Secondary | ICD-10-CM | POA: Diagnosis not present

## 2022-08-07 ENCOUNTER — Other Ambulatory Visit: Payer: Self-pay

## 2022-08-07 ENCOUNTER — Inpatient Hospital Stay: Payer: 59

## 2022-08-07 VITALS — BP 138/85 | HR 67 | Temp 98.4°F | Resp 16

## 2022-08-07 DIAGNOSIS — E538 Deficiency of other specified B group vitamins: Secondary | ICD-10-CM | POA: Diagnosis not present

## 2022-08-07 DIAGNOSIS — D509 Iron deficiency anemia, unspecified: Secondary | ICD-10-CM | POA: Diagnosis not present

## 2022-08-07 MED ORDER — SODIUM CHLORIDE 0.9 % IV SOLN
400.0000 mg | Freq: Once | INTRAVENOUS | Status: AC
  Administered 2022-08-07: 400 mg via INTRAVENOUS
  Filled 2022-08-07: qty 400

## 2022-08-07 MED ORDER — LORATADINE 10 MG PO TABS
10.0000 mg | ORAL_TABLET | Freq: Once | ORAL | Status: AC
  Administered 2022-08-07: 10 mg via ORAL
  Filled 2022-08-07: qty 1

## 2022-08-07 MED ORDER — SODIUM CHLORIDE 0.9 % IV SOLN: INTRAVENOUS | Status: DC

## 2022-08-07 NOTE — Progress Notes (Signed)
Patient tolerates iron well- declined 30 minute observation. Vss- BP 138/85 (BP Location: Right Arm, Patient Position: Sitting)   Pulse 67   Temp 98.4 F (36.9 C) (Oral)   Resp 16   SpO2 100%

## 2022-08-07 NOTE — Patient Instructions (Signed)
 Iron Sucrose Injection What is this medication? IRON SUCROSE (EYE ern SOO krose) treats low levels of iron (iron deficiency anemia) in people with kidney disease. Iron is a mineral that plays an important role in making red blood cells, which carry oxygen from your lungs to the rest of your body. This medicine may be used for other purposes; ask your health care provider or pharmacist if you have questions. COMMON BRAND NAME(S): Venofer What should I tell my care team before I take this medication? They need to know if you have any of these conditions: Anemia not caused by low iron levels Heart disease High levels of iron in the blood Kidney disease Liver disease An unusual or allergic reaction to iron, other medications, foods, dyes, or preservatives Pregnant or trying to get pregnant Breast-feeding How should I use this medication? This medication is for infusion into a vein. It is given in a hospital or clinic setting. Talk to your care team about the use of this medication in children. While this medication may be prescribed for children as young as 2 years for selected conditions, precautions do apply. Overdosage: If you think you have taken too much of this medicine contact a poison control center or emergency room at once. NOTE: This medicine is only for you. Do not share this medicine with others. What if I miss a dose? It is important not to miss your dose. Call your care team if you are unable to keep an appointment. What may interact with this medication? Do not take this medication with any of the following: Deferoxamine Dimercaprol Other iron products This medication may also interact with the following: Chloramphenicol Deferasirox This list may not describe all possible interactions. Give your health care provider a list of all the medicines, herbs, non-prescription drugs, or dietary supplements you use. Also tell them if you smoke, drink alcohol, or use illegal drugs.  Some items may interact with your medicine. What should I watch for while using this medication? Visit your care team regularly. Tell your care team if your symptoms do not start to get better or if they get worse. You may need blood work done while you are taking this medication. You may need to follow a special diet. Talk to your care team. Foods that contain iron include: whole grains/cereals, dried fruits, beans, or peas, leafy green vegetables, and organ meats (liver, kidney). What side effects may I notice from receiving this medication? Side effects that you should report to your care team as soon as possible: Allergic reactions--skin rash, itching, hives, swelling of the face, lips, tongue, or throat Low blood pressure--dizziness, feeling faint or lightheaded, blurry vision Shortness of breath Side effects that usually do not require medical attention (report to your care team if they continue or are bothersome): Flushing Headache Joint pain Muscle pain Nausea Pain, redness, or irritation at injection site This list may not describe all possible side effects. Call your doctor for medical advice about side effects. You may report side effects to FDA at 1-800-FDA-1088. Where should I keep my medication? This medication is given in a hospital or clinic and will not be stored at home. NOTE: This sheet is a summary. It may not cover all possible information. If you have questions about this medicine, talk to your doctor, pharmacist, or health care provider.  2023 Elsevier/Gold Standard (2007-03-01 00:00:00)  

## 2022-08-08 ENCOUNTER — Other Ambulatory Visit (HOSPITAL_COMMUNITY): Payer: Self-pay | Admitting: Psychiatry

## 2022-08-08 ENCOUNTER — Other Ambulatory Visit: Payer: Self-pay | Admitting: Nurse Practitioner

## 2022-08-08 DIAGNOSIS — M0579 Rheumatoid arthritis with rheumatoid factor of multiple sites without organ or systems involvement: Secondary | ICD-10-CM

## 2022-08-08 NOTE — Telephone Encounter (Signed)
Refill request last apt 02/02/22. 

## 2022-08-09 ENCOUNTER — Telehealth (INDEPENDENT_AMBULATORY_CARE_PROVIDER_SITE_OTHER): Payer: 59 | Admitting: Psychiatry

## 2022-08-09 ENCOUNTER — Other Ambulatory Visit: Payer: Self-pay | Admitting: Nurse Practitioner

## 2022-08-09 ENCOUNTER — Encounter (HOSPITAL_COMMUNITY): Payer: Self-pay | Admitting: Psychiatry

## 2022-08-09 DIAGNOSIS — F411 Generalized anxiety disorder: Secondary | ICD-10-CM | POA: Diagnosis not present

## 2022-08-09 DIAGNOSIS — F4321 Adjustment disorder with depressed mood: Secondary | ICD-10-CM

## 2022-08-09 DIAGNOSIS — F331 Major depressive disorder, recurrent, moderate: Secondary | ICD-10-CM

## 2022-08-09 DIAGNOSIS — F332 Major depressive disorder, recurrent severe without psychotic features: Secondary | ICD-10-CM

## 2022-08-09 DIAGNOSIS — F900 Attention-deficit hyperactivity disorder, predominantly inattentive type: Secondary | ICD-10-CM

## 2022-08-09 DIAGNOSIS — G4710 Hypersomnia, unspecified: Secondary | ICD-10-CM

## 2022-08-09 MED ORDER — FLUOXETINE HCL 20 MG PO CAPS: 20.0000 mg | ORAL_CAPSULE | Freq: Every day | ORAL | 0 refills | Status: DC
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
## 2022-08-10 ENCOUNTER — Other Ambulatory Visit: Payer: Self-pay | Admitting: Nurse Practitioner

## 2022-08-10 ENCOUNTER — Encounter: Payer: Self-pay | Admitting: Internal Medicine

## 2022-08-10 DIAGNOSIS — E1139 Type 2 diabetes mellitus with other diabetic ophthalmic complication: Secondary | ICD-10-CM

## 2022-08-20 DIAGNOSIS — M0579 Rheumatoid arthritis with rheumatoid factor of multiple sites without organ or systems involvement: Secondary | ICD-10-CM | POA: Diagnosis not present

## 2022-08-21 ENCOUNTER — Ambulatory Visit (HOSPITAL_COMMUNITY): Payer: Self-pay | Admitting: Licensed Clinical Social Worker

## 2022-08-23 ENCOUNTER — Ambulatory Visit (INDEPENDENT_AMBULATORY_CARE_PROVIDER_SITE_OTHER): Payer: 59 | Admitting: Licensed Clinical Social Worker

## 2022-08-23 DIAGNOSIS — F411 Generalized anxiety disorder: Secondary | ICD-10-CM | POA: Diagnosis not present

## 2022-08-23 DIAGNOSIS — F331 Major depressive disorder, recurrent, moderate: Secondary | ICD-10-CM | POA: Diagnosis not present

## 2022-08-23 NOTE — Progress Notes (Signed)
THERAPIST PROGRESS NOTE  Session Time: 9:00 am-9:45 am  Type of Therapy: Individual Therapy  Purpose of session:Melissa Massey will manage mood and anxiety as evidenced by finding self, daily hygiene, improved sleep, coping appropriately with stressors, identify and challenge anxious and depressive thoughts, and processing the past for 5 out of 7 days for 60 days.  ProgressTowards Goals: Initial  Interventions: Therapist utilized CBT and Solution focused brief therapy to address mood. Therapist provided support and empathy to patient during session. Therapist administered the PHQ9 and the GAD7 to patient. Therapist processed Melissa Massey feelings to identify triggers for mood. Therapist worked with patient to identify her personal values. Therapist explored Melissa Massey feelings of hurt.    Effectiveness: Patient was oriented x4 (person, place, situation, and time). Patient was casually dressed, and appropriately groomed. Patient was alert, engaged, pleasant, and cooperative. Patient completed a PHQ9 with a score of 19 indicating moderate severe depressive symptoms. Patient completed a GAD7 with a score of 16 indicating severe anxiety symptoms. Patient noted that her mother in law passed away and the family is grieving. Patient was able to identify her values including: kids, cats, brother, family , family time, job, friendships, Clinical cytogeneticist, creativity, and cooking. Patient identified the following feelings, sensations, and past memories that stop her from living her best life including: feeling overwhelmed, feeling stuck, lack of motivation, depression, overwhelming thoughts, pity-why me? Thinking, and past hurt from her father. Patient identified that when she experiences those she will do certain things to avoid experiencing them such as: sleep, shop, eat, cry, and disassociate. Patient noted that to get closer to what she wants she needs to develop or do more of the following: coping skills, move forward,  support system, and self care. Patient shared that she wants to forgive her father but struggles because she doesn't understand. Patient was adopted by her father and she always knew that he was not her biological father. She was close to him, and took care of him. Her mother passed and wanted her to take guardianship of Melissa Massey brother but her father wanted guardianship. Melissa Massey father became upset with her about this. A few months after this he got an insurance policy but left her off but included her siblings. Patient didn't even want money but felt excluded by her father. She didn't discover this until the night before her father passed when someone asked her to look at the policy. She has been hurt ever since and thinks about that hurt constantly.   Patient engaged in session. She responded well to interventions. Patient continues to meet criteria for Major depressive disorder, recurrent episode, with anxious distress, and Generalized Anxiety Disorder. Patient will continue in outpatient therapy due to being the least restrictive service to meet her needs at this time. Patient made minimal progress on her goals.   Suicidal/Homicidal: Nowithout intent/plan  Plan: Return again in 2-4 weeks.  Diagnosis: Major depressive disorder, recurrent episode, moderate with anxious distress (HCC)  GAD (generalized anxiety disorder)  Collaboration of Care: Psychiatrist AEB Dr. Thresa Ross  Patient/Guardian was advised Release of Information must be obtained prior to any record release in order to collaborate their care with an outside provider. Patient/Guardian was advised if they have not already done so to contact the registration department to sign all necessary forms in order for [place] to release information regarding their care.   Consent: Patient/Guardian gives verbal consent for treatment and assignment of benefits for services provided during this visit. Patient/Guardian expressed understanding and  agreed to  proceed.    Bynum Bellows, LCSW 08/24/2022

## 2022-09-05 ENCOUNTER — Ambulatory Visit (HOSPITAL_COMMUNITY): Payer: 59 | Admitting: Licensed Clinical Social Worker

## 2022-09-05 DIAGNOSIS — F331 Major depressive disorder, recurrent, moderate: Secondary | ICD-10-CM

## 2022-09-05 DIAGNOSIS — F411 Generalized anxiety disorder: Secondary | ICD-10-CM

## 2022-09-06 NOTE — Progress Notes (Signed)
   THERAPIST PROGRESS NOTE  Session Time: 8:00 am-8:45 am  Type of Therapy: Individual Therapy  Purpose of session:Kaveri will manage mood and anxiety as evidenced by finding self, daily hygiene, improved sleep, coping appropriately with stressors, identify and challenge anxious and depressive thoughts, and processing the past for 5 out of 7 days for 60 days.  ProgressTowards Goals: Initial  Interventions: Therapist utilized CBT and Solution focused brief therapy to address mood. Therapist provided support and empathy to patient during session. Therapist administered the PHQ9 and the GAD7 to patient. Therapist processed patient's feelings to identify triggers for mood and worked with patient to identify what has gone well. Therapist provided psychoeducation on CBT and worked with patient to identify her thoughts that lead to her emotions and reactions.    Effectiveness: Patient was oriented x4 (person, place, situation, and time). Patient was casually dressed, and appropriately groomed. Patient was alert, engaged, pleasant, and cooperative. Patient completed a PHQ9 with a score of 12 indicating moderate severe depressive symptoms. Patient completed a GAD7 with a score of 10 indicating severe anxiety symptoms. Patient noted that she has started to focus on herself. She had lunch with a friend though she originally didn't want to go. Patient got her nails done as well. She is finding herself again. Patient understood CBT and how her thoughts influence her. Patient is not at the point of wanting to forgive her father yet. She wants to get to that point but is not ready. Patient is going to continue to focus on herself and pay attention to her thoughts.    Patient engaged in session. She responded well to interventions. Patient continues to meet criteria for Major depressive disorder, recurrent episode, with anxious distress, and Generalized Anxiety Disorder. Patient will continue in outpatient  therapy due to being the least restrictive service to meet her needs at this time. Patient made minimal progress on her goals.   Suicidal/Homicidal: Nowithout intent/plan  Plan: Return again in 2-4 weeks.  Diagnosis: Major depressive disorder, recurrent episode, moderate with anxious distress (HCC)  GAD (generalized anxiety disorder)  Collaboration of Care: Psychiatrist AEB Dr. Thresa Ross  Patient/Guardian was advised Release of Information must be obtained prior to any record release in order to collaborate their care with an outside provider. Patient/Guardian was advised if they have not already done so to contact the registration department to sign all necessary forms in order for [place] to release information regarding their care.   Consent: Patient/Guardian gives verbal consent for treatment and assignment of benefits for services provided during this visit. Patient/Guardian expressed understanding and agreed to proceed.    Bynum Bellows, LCSW 09/06/2022

## 2022-09-10 ENCOUNTER — Other Ambulatory Visit: Payer: Self-pay | Admitting: Family Medicine

## 2022-09-10 ENCOUNTER — Other Ambulatory Visit: Payer: Self-pay | Admitting: Nurse Practitioner

## 2022-09-10 ENCOUNTER — Other Ambulatory Visit: Payer: Self-pay | Admitting: Medical

## 2022-09-10 ENCOUNTER — Other Ambulatory Visit (HOSPITAL_COMMUNITY): Payer: Self-pay | Admitting: Psychiatry

## 2022-09-10 DIAGNOSIS — F900 Attention-deficit hyperactivity disorder, predominantly inattentive type: Secondary | ICD-10-CM

## 2022-09-10 DIAGNOSIS — G4710 Hypersomnia, unspecified: Secondary | ICD-10-CM

## 2022-09-10 DIAGNOSIS — M0579 Rheumatoid arthritis with rheumatoid factor of multiple sites without organ or systems involvement: Secondary | ICD-10-CM

## 2022-09-10 DIAGNOSIS — E1139 Type 2 diabetes mellitus with other diabetic ophthalmic complication: Secondary | ICD-10-CM

## 2022-09-10 NOTE — Telephone Encounter (Signed)
Is this okay to refill? 

## 2022-09-10 NOTE — Telephone Encounter (Signed)
Last apt 02/02/22.

## 2022-09-18 ENCOUNTER — Ambulatory Visit (HOSPITAL_COMMUNITY): Payer: 59 | Admitting: Licensed Clinical Social Worker

## 2022-09-20 ENCOUNTER — Telehealth: Payer: Self-pay

## 2022-09-20 NOTE — Telephone Encounter (Signed)
Request faxed for cyclobenzaprine. Cancelled last appt on 02/06/22. Next appt. 10/04/22

## 2022-09-30 ENCOUNTER — Other Ambulatory Visit: Payer: Self-pay | Admitting: Nurse Practitioner

## 2022-09-30 DIAGNOSIS — J4542 Moderate persistent asthma with status asthmaticus: Secondary | ICD-10-CM

## 2022-10-01 ENCOUNTER — Ambulatory Visit (HOSPITAL_COMMUNITY): Payer: 59 | Admitting: Licensed Clinical Social Worker

## 2022-10-01 DIAGNOSIS — F411 Generalized anxiety disorder: Secondary | ICD-10-CM | POA: Diagnosis not present

## 2022-10-01 DIAGNOSIS — F331 Major depressive disorder, recurrent, moderate: Secondary | ICD-10-CM | POA: Diagnosis not present

## 2022-10-01 NOTE — Progress Notes (Signed)
THERAPIST PROGRESS NOTE  Session Time: 8:00 am-8:45 am  Type of Therapy: Individual Therapy  Purpose of session:Addysin will manage mood and anxiety as evidenced by finding self, daily hygiene, improved sleep, coping appropriately with stressors, identify and challenge anxious and depressive thoughts, and processing the past for 5 out of 7 days for 60 days.  ProgressTowards Goals: Initial  Interventions: Therapist utilized CBT and Solution focused brief therapy to address mood. Therapist provided support and empathy to patient during session. Therapist administered the PHQ9 and the GAD7 to patient. Therapist worked with patient on challenging anxious and depressive thoughts.    Effectiveness: Patient was oriented x4 (person, place, situation, and time). Patient was casually dressed, and appropriately groomed. Patient was alert, engaged, pleasant, and cooperative. Patient completed a PHQ9 with a score of 15 indicating moderate severe depressive symptoms. Patient completed a GAD7 with a score of 13 indicating moderate anxiety symptoms. Patient noted that things have been going ok. Patient is taking care of her father and taking him to the wound clinic. Patient also noted that her daughter was working for her business partner's spouse. Patient's daughter put her two weeks notice in due to the bad work conditions. She has RA and had a flair up which caused her to not be able to go to work. Patient noted her daughter's boss her told her not to come back. Patient was frustrated by this. She noted her daughter's boss, who works next door, came by to discuss it and stated things that were not true such as patient's daughter had missed work weekly. She defended her daughter and got angry with him to the point she cried. She didn't like getting to that point but also felt some feelings were released that she has been carrying for some time now. Patient and her sister have been talking about things with her  father. She feels like maybe her father didn't fully understand what he was signing or signed the life insurance policy that left patient out so there was no conflict. Patient feels like that though helped her a little with what she was experiencing. Patient is struggling with motivation to clean. She makes lists and knows what needs to be done but hasn't done them. She is going to look for an app or set reminders on her phone to trigger the thought to clean or do paper work. She feels like she gets overwhelmed thinking about all that she needs to do rather than focusing on one task at a time.    Patient engaged in session. She responded well to interventions. Patient continues to meet criteria for Major depressive disorder, recurrent episode, with anxious distress, and Generalized Anxiety Disorder. Patient will continue in outpatient therapy due to being the least restrictive service to meet her needs at this time. Patient made minimal progress on her goals.   Suicidal/Homicidal: Nowithout intent/plan  Plan: Return again in 2-4 weeks.  Diagnosis: Major depressive disorder, recurrent episode, moderate with anxious distress (HCC)  GAD (generalized anxiety disorder)  Collaboration of Care: Psychiatrist AEB Dr. Thresa Ross  Patient/Guardian was advised Release of Information must be obtained prior to any record release in order to collaborate their care with an outside provider. Patient/Guardian was advised if they have not already done so to contact the registration department to sign all necessary forms in order for [place] to release information regarding their care.   Consent: Patient/Guardian gives verbal consent for treatment and assignment of benefits for services provided during this visit.  Patient/Guardian expressed understanding and agreed to proceed.    Bynum Bellows, LCSW 10/01/2022

## 2022-10-03 ENCOUNTER — Encounter: Payer: Self-pay | Admitting: Nurse Practitioner

## 2022-10-04 ENCOUNTER — Ambulatory Visit: Payer: 59 | Admitting: Nurse Practitioner

## 2022-10-10 DIAGNOSIS — Z6841 Body Mass Index (BMI) 40.0 and over, adult: Secondary | ICD-10-CM | POA: Diagnosis not present

## 2022-10-10 DIAGNOSIS — H04123 Dry eye syndrome of bilateral lacrimal glands: Secondary | ICD-10-CM | POA: Diagnosis not present

## 2022-10-10 DIAGNOSIS — M0579 Rheumatoid arthritis with rheumatoid factor of multiple sites without organ or systems involvement: Secondary | ICD-10-CM | POA: Diagnosis not present

## 2022-10-10 DIAGNOSIS — H15001 Unspecified scleritis, right eye: Secondary | ICD-10-CM | POA: Diagnosis not present

## 2022-10-10 DIAGNOSIS — M5136 Other intervertebral disc degeneration, lumbar region: Secondary | ICD-10-CM | POA: Diagnosis not present

## 2022-10-10 DIAGNOSIS — D508 Other iron deficiency anemias: Secondary | ICD-10-CM | POA: Diagnosis not present

## 2022-10-10 DIAGNOSIS — M791 Myalgia, unspecified site: Secondary | ICD-10-CM | POA: Diagnosis not present

## 2022-10-11 ENCOUNTER — Other Ambulatory Visit (HOSPITAL_COMMUNITY): Payer: Self-pay | Admitting: Psychiatry

## 2022-10-11 ENCOUNTER — Other Ambulatory Visit: Payer: Self-pay | Admitting: Nurse Practitioner

## 2022-10-19 ENCOUNTER — Other Ambulatory Visit: Payer: Self-pay | Admitting: Nurse Practitioner

## 2022-10-19 DIAGNOSIS — G4710 Hypersomnia, unspecified: Secondary | ICD-10-CM

## 2022-10-19 DIAGNOSIS — F900 Attention-deficit hyperactivity disorder, predominantly inattentive type: Secondary | ICD-10-CM

## 2022-10-19 NOTE — Telephone Encounter (Signed)
Last apt 02/02/22.

## 2022-10-25 ENCOUNTER — Other Ambulatory Visit: Payer: Self-pay | Admitting: Pulmonary Disease

## 2022-11-16 ENCOUNTER — Other Ambulatory Visit: Payer: Self-pay | Admitting: Nurse Practitioner

## 2022-11-16 MED ORDER — LIOTHYRONINE SODIUM 5 MCG PO TABS: 5.0000 ug | ORAL_TABLET | Freq: Two times a day (BID) | ORAL | 1 refills | Status: DC

## 2022-11-16 MED ORDER — LEVOTHYROXINE SODIUM 125 MCG PO TABS: 250.0000 ug | ORAL_TABLET | Freq: Every day | ORAL | 1 refills | Status: DC

## 2022-11-16 NOTE — Telephone Encounter (Signed)
I did not see where you had filled this for her before. Last apt 02/02/22.

## 2022-11-19 ENCOUNTER — Other Ambulatory Visit (HOSPITAL_COMMUNITY): Payer: Self-pay | Admitting: *Deleted

## 2022-11-19 NOTE — Telephone Encounter (Signed)
Left message for patient to call back and schedule appointment. She has not been discharged.

## 2022-11-19 NOTE — Telephone Encounter (Signed)
Message LEFT for patient to call back and schedule appointment. She has not been discharged. PER SCOTT

## 2022-11-19 NOTE — Telephone Encounter (Signed)
Is this patient scheduled or discharged?  Can send refill after appointment otherwise no. You can send and ill co sign if there is appointment

## 2022-11-20 NOTE — Telephone Encounter (Signed)
READING PREVIOUS NOTES

## 2022-11-22 MED ORDER — FLUOXETINE HCL 20 MG PO CAPS: 20.0000 mg | ORAL_CAPSULE | Freq: Every day | ORAL | 0 refills | Status: DC

## 2022-12-01 ENCOUNTER — Other Ambulatory Visit: Payer: Self-pay | Admitting: Nurse Practitioner

## 2022-12-01 DIAGNOSIS — J4542 Moderate persistent asthma with status asthmaticus: Secondary | ICD-10-CM

## 2022-12-05 ENCOUNTER — Other Ambulatory Visit: Payer: Self-pay | Admitting: Nurse Practitioner

## 2022-12-12 ENCOUNTER — Telehealth: Payer: Self-pay | Admitting: Nurse Practitioner

## 2022-12-12 DIAGNOSIS — G4710 Hypersomnia, unspecified: Secondary | ICD-10-CM

## 2022-12-12 DIAGNOSIS — F900 Attention-deficit hyperactivity disorder, predominantly inattentive type: Secondary | ICD-10-CM

## 2022-12-12 NOTE — Telephone Encounter (Signed)
Pt requesting a refill on adderall to [facility] - [place] - [address]

## 2022-12-14 MED ORDER — AMPHETAMINE-DEXTROAMPHET ER 20 MG PO CP24
40.0000 mg | ORAL_CAPSULE | Freq: Every day | ORAL | 0 refills | Status: DC
Start: 2022-12-14 — End: 2023-01-04

## 2022-12-27 ENCOUNTER — Other Ambulatory Visit: Payer: Self-pay | Admitting: Nurse Practitioner

## 2022-12-27 DIAGNOSIS — J4542 Moderate persistent asthma with status asthmaticus: Secondary | ICD-10-CM

## 2023-01-01 ENCOUNTER — Other Ambulatory Visit: Payer: Self-pay | Admitting: Nurse Practitioner

## 2023-01-01 DIAGNOSIS — E1165 Type 2 diabetes mellitus with hyperglycemia: Secondary | ICD-10-CM

## 2023-01-04 ENCOUNTER — Ambulatory Visit: Payer: 59 | Admitting: Nurse Practitioner

## 2023-01-04 ENCOUNTER — Encounter: Payer: Self-pay | Admitting: Nurse Practitioner

## 2023-01-04 VITALS — BP 132/82 | HR 69 | Wt 300.2 lb

## 2023-01-04 DIAGNOSIS — E0500 Thyrotoxicosis with diffuse goiter without thyrotoxic crisis or storm: Secondary | ICD-10-CM

## 2023-01-04 DIAGNOSIS — J4540 Moderate persistent asthma, uncomplicated: Secondary | ICD-10-CM

## 2023-01-04 DIAGNOSIS — E538 Deficiency of other specified B group vitamins: Secondary | ICD-10-CM | POA: Diagnosis not present

## 2023-01-04 DIAGNOSIS — I152 Hypertension secondary to endocrine disorders: Secondary | ICD-10-CM

## 2023-01-04 DIAGNOSIS — F900 Attention-deficit hyperactivity disorder, predominantly inattentive type: Secondary | ICD-10-CM | POA: Diagnosis not present

## 2023-01-04 DIAGNOSIS — E1159 Type 2 diabetes mellitus with other circulatory complications: Secondary | ICD-10-CM

## 2023-01-04 DIAGNOSIS — B009 Herpesviral infection, unspecified: Secondary | ICD-10-CM

## 2023-01-04 DIAGNOSIS — E1139 Type 2 diabetes mellitus with other diabetic ophthalmic complication: Secondary | ICD-10-CM | POA: Diagnosis not present

## 2023-01-04 DIAGNOSIS — M0579 Rheumatoid arthritis with rheumatoid factor of multiple sites without organ or systems involvement: Secondary | ICD-10-CM | POA: Diagnosis not present

## 2023-01-04 DIAGNOSIS — N970 Female infertility associated with anovulation: Secondary | ICD-10-CM | POA: Diagnosis not present

## 2023-01-04 DIAGNOSIS — E785 Hyperlipidemia, unspecified: Secondary | ICD-10-CM | POA: Diagnosis not present

## 2023-01-04 DIAGNOSIS — G4710 Hypersomnia, unspecified: Secondary | ICD-10-CM

## 2023-01-04 DIAGNOSIS — K760 Fatty (change of) liver, not elsewhere classified: Secondary | ICD-10-CM

## 2023-01-04 DIAGNOSIS — D508 Other iron deficiency anemias: Secondary | ICD-10-CM | POA: Diagnosis not present

## 2023-01-04 DIAGNOSIS — R230 Cyanosis: Secondary | ICD-10-CM

## 2023-01-04 DIAGNOSIS — E1169 Type 2 diabetes mellitus with other specified complication: Secondary | ICD-10-CM | POA: Diagnosis not present

## 2023-01-04 DIAGNOSIS — G629 Polyneuropathy, unspecified: Secondary | ICD-10-CM

## 2023-01-04 DIAGNOSIS — E1165 Type 2 diabetes mellitus with hyperglycemia: Secondary | ICD-10-CM

## 2023-01-04 DIAGNOSIS — E559 Vitamin D deficiency, unspecified: Secondary | ICD-10-CM | POA: Diagnosis not present

## 2023-01-04 DIAGNOSIS — F419 Anxiety disorder, unspecified: Secondary | ICD-10-CM

## 2023-01-04 DIAGNOSIS — E063 Autoimmune thyroiditis: Secondary | ICD-10-CM

## 2023-01-04 DIAGNOSIS — F339 Major depressive disorder, recurrent, unspecified: Secondary | ICD-10-CM

## 2023-01-04 DIAGNOSIS — Z7952 Long term (current) use of systemic steroids: Secondary | ICD-10-CM

## 2023-01-04 DIAGNOSIS — Z23 Encounter for immunization: Secondary | ICD-10-CM

## 2023-01-04 DIAGNOSIS — M51369 Other intervertebral disc degeneration, lumbar region without mention of lumbar back pain or lower extremity pain: Secondary | ICD-10-CM | POA: Insufficient documentation

## 2023-01-04 LAB — LIPID PANEL

## 2023-01-04 MED ORDER — VALACYCLOVIR HCL 1 G PO TABS: ORAL_TABLET | ORAL | 3 refills | Status: DC

## 2023-01-04 MED ORDER — CELECOXIB 200 MG PO CAPS: 200.0000 mg | ORAL_CAPSULE | Freq: Two times a day (BID) | ORAL | 3 refills | Status: DC

## 2023-01-04 MED ORDER — TRESIBA FLEXTOUCH 100 UNIT/ML ~~LOC~~ SOPN: PEN_INJECTOR | SUBCUTANEOUS | 3 refills | Status: DC

## 2023-01-04 MED ORDER — LOSARTAN POTASSIUM 100 MG PO TABS: 100.0000 mg | ORAL_TABLET | Freq: Every day | ORAL | 3 refills | Status: DC

## 2023-01-04 MED ORDER — HYDROCHLOROTHIAZIDE 25 MG PO TABS: 25.0000 mg | ORAL_TABLET | Freq: Every day | ORAL | 3 refills | Status: DC

## 2023-01-04 MED ORDER — FLUOXETINE HCL 20 MG PO CAPS: 20.0000 mg | ORAL_CAPSULE | Freq: Every day | ORAL | 3 refills | Status: DC

## 2023-01-04 MED ORDER — LIOTHYRONINE SODIUM 5 MCG PO TABS: 5.0000 ug | ORAL_TABLET | Freq: Two times a day (BID) | ORAL | 1 refills | Status: DC

## 2023-01-04 MED ORDER — LEVOTHYROXINE SODIUM 125 MCG PO TABS: 250.0000 ug | ORAL_TABLET | Freq: Every day | ORAL | 1 refills | Status: DC

## 2023-01-04 MED ORDER — CYANOCOBALAMIN 1000 MCG/ML IJ SOLN
1000.0000 ug | Freq: Once | INTRAMUSCULAR | Status: AC
  Administered 2023-01-04: 1000 ug via INTRAMUSCULAR

## 2023-01-04 MED ORDER — VENLAFAXINE HCL ER 75 MG PO CP24: 150.0000 mg | ORAL_CAPSULE | Freq: Every day | ORAL | 3 refills | Status: DC

## 2023-01-04 MED ORDER — METFORMIN HCL ER 500 MG PO TB24: ORAL_TABLET | ORAL | 3 refills | Status: DC

## 2023-01-04 MED ORDER — AMPHETAMINE-DEXTROAMPHET ER 30 MG PO CP24: 30.0000 mg | ORAL_CAPSULE | Freq: Every day | ORAL | 0 refills | Status: DC

## 2023-01-04 MED ORDER — TRELEGY ELLIPTA 200-62.5-25 MCG/ACT IN AEPB: INHALATION_SPRAY | RESPIRATORY_TRACT | 3 refills | Status: DC

## 2023-01-04 MED ORDER — AMPHETAMINE-DEXTROAMPHET ER 10 MG PO CP24: 10.0000 mg | ORAL_CAPSULE | Freq: Every day | ORAL | 0 refills | Status: DC

## 2023-01-04 MED ORDER — GLIPIZIDE 5 MG PO TABS: 5.0000 mg | ORAL_TABLET | Freq: Two times a day (BID) | ORAL | 3 refills | Status: DC

## 2023-01-04 NOTE — Progress Notes (Unsigned)
Melissa Clamp, DNP, AGNP-c [facility]  [address] [place], [place] [address] [phone]  ESTABLISHED PATIENT- Chronic Health and/or Follow-Up Visit  Blood pressure 132/82, pulse 69, weight (!) 300 lb 3.2 oz (136.2 kg).    Melissa Massey is a 47 y.o. year old female presenting today for evaluation and management of chronic conditions with a MedCheck.  History of Present Illness The patient, with a history of rheumatoid arthritis (RA), diabetes, and recent bereavements, presents with concerns about her overall health and medication management. She reports a period of emotional distress following the loss of both parents and her mother-in-law, which led to a neglect of her health and medication regimen. She has been in counseling but is currently seeking a new counselor.  The patient reports a potential need for iron infusions, suspecting that her iron levels may be low. She also expresses concerns about being in perimenopause, citing a lack of menstruation for three months and experiencing hot flashes. She also reports memory issues.  The patient has been managing her diabetes with insulin and glipizide, maintaining blood sugar levels between 130 and 200 most of the time. However, she has been on prednisone for three years due to RA, which she believes may be affecting her blood sugar levels. She expresses a desire to restart Ozempic, noting that it not only helped with her blood sugars but also reduced her inflammation.  The patient also reports numbness in her feet, which she is unsure if it is due to radiculopathy from lumbar surgery, neuropathy from diabetes, or a vascular issue. She notes that her toes often turn blue and are cold, and she experiences pain, especially at night. She also reports that she has difficulty feeling sensations on the soles of her feet and toes.  The patient has been tapering off prednisone slowly and is currently on Effexor and Prozac  for mental health management. She expresses a desire to come off Effexor due to concerns about side effects and believes that Prozac has been more beneficial for her anxiety. She also mentions that she has been on Adderall for focus and energy.  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 She has not had any flare-ups in the last six months since starting the infusions. She also expresses concerns about potential bone density loss due to long-term prednisone use.   All ROS negative with exception of what is listed above.   PHYSICAL EXAM Physical Exam Vitals and nursing note reviewed.  Constitutional:      General: She is not in acute distress.    Appearance: Normal appearance. She is obese.  HENT:     Head: Normocephalic.  Eyes:     Conjunctiva/sclera: Conjunctivae normal.     Pupils: Pupils are equal, round, and reactive to light.  Neck:     Vascular: No carotid bruit.  Cardiovascular:     Rate and Rhythm: Normal rate and regular rhythm.     Pulses: Normal pulses.     Heart sounds: Normal heart sounds.  Pulmonary:     Effort: Pulmonary effort is normal.     Breath sounds: Normal breath sounds.  Musculoskeletal:        General: Normal range of motion.     Cervical back: Normal range of motion.  Skin:    General: Skin is warm.  Neurological:     General: No focal deficit present.  Mental Status: She is alert and oriented to person, place, and time.  Psychiatric:        Mood and Affect: Mood normal.      PLAN Assessment & Plan Diabetes Mellitus Blood sugars typically between 130-200 mg/dL, with occasional spikes due to prednisone. Discussed potential benefits of GLP-1 agonists (Mounjaro, Ozempic) for blood sugar control and inflammation, including ongoing studies showing remission of autoimmune disorders. - Check insurance coverage for Mounjaro and Ozempic -  Consider starting Trulicity if other options are not covered - Monitor blood glucose levels  Rheumatoid Arthritis On prednisone for three years, currently tapering. 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 Discussed need for bone density scan due to long-term prednisone use. - Continue Rituxan infusions - Taper prednisone slowly - Order bone density scan  Neuropathy Reports numbness and pain in feet, with toes turning blue and feeling cold. Differential includes diabetic neuropathy, radiculopathy, and vascular issues. Discussed possibility of Raynaud's phenomenon and need for vascular evaluation. - Refer to vascular surgeon for evaluation - Consider Raynaud's phenomenon as a differential diagnosis  Medication Management Requires refills and adjustments for multiple medications including Effexor, Prozac, Trelegy, rosuvastatin, glipizide, Tresiba, levothyroxine, Cytomel, losartan, metformin, Valtrex, Adderall, and Celebrex. Discussed tapering off venlafaxine (Effexor) and potentially increasing Prozac if needed. Reports difficulty self-administering B12 injections, prefers in-office administration. - Refill Effexor 75 mg twice daily - Refill Prozac 20 mg daily - Refill Trelegy - Refill rosuvastatin - Refill glipizide 5 mg daily - Refill Tresiba 10 units daily - Refill levothyroxine 125 mcg daily - Refill Cytomel 5 mcg twice daily - Refill losartan - Refill metformin 2000 mg daily - Refill Valtrex as needed - Refill Adderall 40 mg daily - Refill Celebrex - Administer B12 injections in-office  Mental Health In counseling but not satisfied with current counselor. On Prozac and Effexor for anxiety and depression, with a plan to taper off Effexor. Discussed potential need to increase Prozac if tapering Effexor causes issues. - Continue Prozac 20 mg daily - Taper Effexor to 75 mg twice daily - Consider increasing Prozac if  needed - Seek new counselor  Perimenopause Reports hot flashes, memory issues, and absence of menstruation for three months. Symptoms suggest perimenopause. Discussed need for hormone level testing and other relevant labs. - Order labs to check hormone levels, A1c, vitamin D, iron, kidney, and liver function  General Health Maintenance Inquires about pneumonia vaccination due to RA and immunosuppression. Discussed importance of regular screenings and vaccinations. - Administer Pneumovax 20 - Schedule mammogram - Schedule colonoscopy - Schedule physical exam  Follow-up - Schedule follow-up appointment in 4-5 months - Monitor response to medication adjustments - Follow up with vascular surgeon.  Orders Placed This Encounter  Procedures   DG Bone Density    Standing Status:   Future    Expiration Date:   01/04/2024    Scheduling Instructions:     Please call patient to schedule    Reason for Exam (SYMPTOM  OR DIAGNOSIS REQUIRED):   screening bone density    Is the patient pregnant?:   No    [facility]erred [facility]    Release to patient:   Immediate   Tdap vaccine greater than or equal to 7yo IM   Flu vaccine trivalent PF, 6mos and older(Flulaval,Afluria,Fluarix,Fluzone)   Hemoglobin A1c    Release to patient:   Immediate   CBC with Differential/Platelet    Release to patient:  Immediate   Comprehensive metabolic panel    Has the patient fasted?:   Yes    Release to patient:   Immediate   Vitamin B12   Iron, TIBC and Ferritin Panel   T4, free   TSH    Release to patient:   Immediate   VITAMIN D 25 Hydroxy (Vit-D Deficiency, Fractures)    Release to patient:   Immediate   Lipid panel    Has the patient fasted?:   Yes    Release to patient:   Immediate   Estradiol    Release to patient:   Immediate [1]   FSH/LH    Release to patient:   Immediate   Testosterone, Free, Total, SHBG    Release to patient:   Immediate   Ambulatory referral to  Vascular Surgery    Referral Priority:   Routine    Referral Type:   Surgical    Referral Reason:   Specialty Services Required    Requested Specialty:   Vascular Surgery    Number of Visits Requested:   1      Return for CPE with pap 4-5 months.  Melissa Clamp, DNP, AGNP-c

## 2023-01-06 LAB — CBC WITH DIFFERENTIAL/PLATELET
Basophils Absolute: 0 10*3/uL (ref 0.0–0.2)
Basos: 0 %
EOS (ABSOLUTE): 0.1 10*3/uL (ref 0.0–0.4)
Eos: 1 %
Hematocrit: 39.5 % (ref 34.0–46.6)
Hemoglobin: 13.1 g/dL (ref 11.1–15.9)
Immature Grans (Abs): 0.1 10*3/uL (ref 0.0–0.1)
Immature Granulocytes: 1 %
Lymphocytes Absolute: 1 10*3/uL (ref 0.7–3.1)
Lymphs: 10 %
MCH: 31.3 pg (ref 26.6–33.0)
MCHC: 33.2 g/dL (ref 31.5–35.7)
MCV: 94 fL (ref 79–97)
Monocytes Absolute: 0.4 10*3/uL (ref 0.1–0.9)
Monocytes: 4 %
Neutrophils Absolute: 8.1 10*3/uL — ABNORMAL HIGH (ref 1.4–7.0)
Neutrophils: 84 %
Platelets: 362 10*3/uL (ref 150–450)
RBC: 4.19 x10E6/uL (ref 3.77–5.28)
RDW: 13.3 % (ref 11.7–15.4)
WBC: 9.6 10*3/uL (ref 3.4–10.8)

## 2023-01-06 LAB — COMPREHENSIVE METABOLIC PANEL
ALT: 25 IU/L (ref 0–32)
AST: 21 IU/L (ref 0–40)
Albumin: 4.3 g/dL (ref 3.9–4.9)
Alkaline Phosphatase: 78 IU/L (ref 44–121)
BUN/Creatinine Ratio: 16 (ref 9–23)
BUN: 12 mg/dL (ref 6–24)
CO2: 22 mmol/L (ref 20–29)
Calcium: 9.1 mg/dL (ref 8.7–10.2)
Chloride: 100 mmol/L (ref 96–106)
Creatinine, Ser: 0.75 mg/dL (ref 0.57–1.00)
Globulin, Total: 2.1 g/dL (ref 1.5–4.5)
Glucose: 231 mg/dL — ABNORMAL HIGH (ref 70–99)
Potassium: 5 mmol/L (ref 3.5–5.2)
Sodium: 137 mmol/L (ref 134–144)
Total Protein: 6.4 g/dL (ref 6.0–8.5)
eGFR: 98 mL/min/{1.73_m2} (ref >59)

## 2023-01-06 LAB — LIPID PANEL
Cholesterol, Total: 248 mg/dL — ABNORMAL HIGH (ref 100–199)
HDL: 69 mg/dL (ref >39)
LDL CALC COMMENT:: 3.6 ratio (ref 0.0–4.4)
LDL Chol Calc (NIH): 139 mg/dL — ABNORMAL HIGH (ref 0–99)
Triglycerides: 228 mg/dL — ABNORMAL HIGH (ref 0–149)
VLDL Cholesterol Cal: 40 mg/dL (ref 5–40)

## 2023-01-06 LAB — HEMOGLOBIN A1C
Est. average glucose Bld gHb Est-mCnc: 166 mg/dL
Hgb A1c MFr Bld: 7.4 % — ABNORMAL HIGH (ref 4.8–5.6)

## 2023-01-06 LAB — VITAMIN B12

## 2023-01-06 LAB — IRON,TIBC AND FERRITIN PANEL
Ferritin: 20 ng/mL (ref 15–150)
Iron Saturation: 12 % — ABNORMAL LOW (ref 15–55)
Iron: 43 ug/dL (ref 27–159)
Total Iron Binding Capacity: 348 ug/dL (ref 250–450)
UIBC: 305 ug/dL (ref 131–425)

## 2023-01-06 LAB — T4, FREE: Free T4: 1.27 ng/dL (ref 0.82–1.77)

## 2023-01-06 LAB — FSH/LH
FSH: 9.8 m[IU]/mL
LH: 6.6 m[IU]/mL

## 2023-01-06 LAB — TESTOSTERONE, FREE, TOTAL, SHBG: Sex Hormone Binding: 34.1 nmol/L (ref 24.6–122.0)

## 2023-01-06 LAB — VITAMIN D 25 HYDROXY (VIT D DEFICIENCY, FRACTURES): Vit D, 25-Hydroxy: 13 ng/mL — ABNORMAL LOW (ref 30.0–100.0)

## 2023-01-06 LAB — TSH: TSH: 2.85 u[IU]/mL (ref 0.450–4.500)

## 2023-01-06 LAB — ESTRADIOL: Estradiol: 23.3 pg/mL

## 2023-01-07 ENCOUNTER — Telehealth: Payer: Self-pay | Admitting: Nurse Practitioner

## 2023-01-07 NOTE — Telephone Encounter (Signed)
The pharmacy called and states for insurance purposes there needs to be a note on the prescription for Evaristo Bury saying the max units per day.  [facility] - [place], [place] - [address]

## 2023-01-08 ENCOUNTER — Other Ambulatory Visit: Payer: Self-pay | Admitting: Nurse Practitioner

## 2023-01-08 DIAGNOSIS — E1139 Type 2 diabetes mellitus with other diabetic ophthalmic complication: Secondary | ICD-10-CM

## 2023-01-08 MED ORDER — TRESIBA FLEXTOUCH 100 UNIT/ML ~~LOC~~ SOPN: PEN_INJECTOR | SUBCUTANEOUS | 3 refills | Status: DC

## 2023-01-08 NOTE — Telephone Encounter (Signed)
Change made and script sent to requested pharmacy. Thank you

## 2023-01-09 DIAGNOSIS — Z111 Encounter for screening for respiratory tuberculosis: Secondary | ICD-10-CM | POA: Diagnosis not present

## 2023-01-09 DIAGNOSIS — M791 Myalgia, unspecified site: Secondary | ICD-10-CM | POA: Diagnosis not present

## 2023-01-09 DIAGNOSIS — H15001 Unspecified scleritis, right eye: Secondary | ICD-10-CM | POA: Diagnosis not present

## 2023-01-09 DIAGNOSIS — Z6841 Body Mass Index (BMI) 40.0 and over, adult: Secondary | ICD-10-CM | POA: Diagnosis not present

## 2023-01-09 DIAGNOSIS — M5136 Other intervertebral disc degeneration, lumbar region with discogenic back pain only: Secondary | ICD-10-CM | POA: Diagnosis not present

## 2023-01-09 DIAGNOSIS — H04123 Dry eye syndrome of bilateral lacrimal glands: Secondary | ICD-10-CM | POA: Diagnosis not present

## 2023-01-09 DIAGNOSIS — D508 Other iron deficiency anemias: Secondary | ICD-10-CM | POA: Diagnosis not present

## 2023-01-09 DIAGNOSIS — M0579 Rheumatoid arthritis with rheumatoid factor of multiple sites without organ or systems involvement: Secondary | ICD-10-CM | POA: Diagnosis not present

## 2023-01-17 MED ORDER — ROSUVASTATIN CALCIUM 40 MG PO TABS: 40.0000 mg | ORAL_TABLET | Freq: Every day | ORAL | 3 refills | Status: DC

## 2023-01-17 MED ORDER — VITAMIN D (ERGOCALCIFEROL) 1.25 MG (50000 UNIT) PO CAPS: 50000.0000 [IU] | ORAL_CAPSULE | ORAL | 3 refills | Status: DC

## 2023-01-17 NOTE — Addendum Note (Signed)
Addended by: Myelle Poteat, Huntley Dec E on: 01/17/2023 08:05 AM   Modules accepted: Orders

## 2023-01-21 ENCOUNTER — Encounter: Payer: Self-pay | Admitting: Nurse Practitioner

## 2023-01-28 ENCOUNTER — Other Ambulatory Visit: Payer: Self-pay | Admitting: Nurse Practitioner

## 2023-01-28 DIAGNOSIS — E1165 Type 2 diabetes mellitus with hyperglycemia: Secondary | ICD-10-CM

## 2023-01-28 DIAGNOSIS — I152 Hypertension secondary to endocrine disorders: Secondary | ICD-10-CM

## 2023-01-30 DIAGNOSIS — M0579 Rheumatoid arthritis with rheumatoid factor of multiple sites without organ or systems involvement: Secondary | ICD-10-CM | POA: Diagnosis not present

## 2023-02-01 ENCOUNTER — Telehealth: Payer: Self-pay

## 2023-02-01 NOTE — Telephone Encounter (Signed)
 Request for clarification levothyroxine 125 mcg tab #180:   "We have changed manufacturers and require your consent to dispense a different product"   Did not state new manufacturer

## 2023-02-04 ENCOUNTER — Other Ambulatory Visit: Payer: 59

## 2023-02-04 ENCOUNTER — Encounter: Payer: Self-pay | Admitting: Nurse Practitioner

## 2023-02-11 ENCOUNTER — Other Ambulatory Visit: Payer: Self-pay

## 2023-02-11 DIAGNOSIS — I739 Peripheral vascular disease, unspecified: Secondary | ICD-10-CM

## 2023-02-14 ENCOUNTER — Encounter: Payer: Self-pay | Admitting: Nurse Practitioner

## 2023-02-15 NOTE — Telephone Encounter (Signed)
See notes, Are you ok with putting pt on Victoza, per insurance requirements?

## 2023-02-18 ENCOUNTER — Other Ambulatory Visit: Payer: Self-pay

## 2023-02-18 MED ORDER — LIRAGLUTIDE 18 MG/3ML ~~LOC~~ SOPN: 0.6000 mg | PEN_INJECTOR | Freq: Every day | SUBCUTANEOUS | 0 refills | Status: DC

## 2023-02-20 DIAGNOSIS — M0579 Rheumatoid arthritis with rheumatoid factor of multiple sites without organ or systems involvement: Secondary | ICD-10-CM | POA: Diagnosis not present

## 2023-02-20 DIAGNOSIS — Z79899 Other long term (current) drug therapy: Secondary | ICD-10-CM | POA: Diagnosis not present

## 2023-02-20 DIAGNOSIS — R5383 Other fatigue: Secondary | ICD-10-CM | POA: Diagnosis not present

## 2023-02-20 NOTE — Progress Notes (Deleted)
 Office Note     CC:  Neuropathy and blue toes  Requesting Provider:  Tollie Eth, NP  HPI: Melissa Massey is a 48 y.o. (04-07-1975) female presenting at the request of .Tollie Eth, NP ***  Medical history includes longstanding diabetes Medications include Adderall  The pt is *** on a statin for cholesterol management.  The pt is *** on a daily aspirin.   Other AC:  *** The pt is *** on medication for hypertension.   The pt is *** diabetic.  Tobacco hx:  ***  Past Medical History:  Diagnosis Date   Arthritis    Asthma    CHF (congestive heart failure) (HCC)    Diabetes mellitus without complication (HCC)    DVT (deep venous thrombosis) (HCC)    Graves disease    Hyperlipidemia    Hypertension    PCOS (polycystic ovarian syndrome)    Rheumatoid arthritis (HCC)     Past Surgical History:  Procedure Laterality Date   CESAREAN SECTION     x2   LUMBAR FUSION     TUBAL LIGATION      Social History   Socioeconomic History   Marital status: Single    Spouse name: Not on file   Number of children: 4   Years of education: Not on file   Highest education level: Not on file  Occupational History   Not on file  Tobacco Use   Smoking status: Former    Current packs/day: 1.00    Types: Cigarettes   Smokeless tobacco: Never  Vaping Use   Vaping status: Former   Substances: Nicotine  Substance and Sexual Activity   Alcohol use: No   Drug use: No   Sexual activity: Yes    Birth control/protection: Surgical  Other Topics Concern   Not on file  Social History Narrative   ** Merged History Encounter **       Social Drivers of Health   Financial Resource Strain: Medium Risk (01/04/2023)   Overall Financial Resource Strain (CARDIA)    Difficulty of Paying Living Expenses: Somewhat hard  Food Insecurity: No Food Insecurity (01/04/2023)   Hunger Vital Sign    Worried About Running Out of Food in the Last Year: Never true    Ran Out of Food in the Last Year:  Never true  Transportation Needs: No Transportation Needs (01/04/2023)   PRAPARE - Administrator, Civil Service (Medical): No    Lack of Transportation (Non-Medical): No  Physical Activity: Insufficiently Active (01/04/2023)   Exercise Vital Sign    Days of Exercise per Week: 3 days    Minutes of Exercise per Session: 30 min  Stress: Stress Concern Present (01/04/2023)   [employer] of Occupational Health - Occupational Stress Questionnaire    Feeling of Stress : Very much  Social Connections: Moderately Integrated (01/04/2023)   Social Connection and Isolation Panel [NHANES]    Frequency of Communication with Friends and Family: Three times a week    Frequency of Social Gatherings with Friends and Family: Once a week    Attends Religious Services: More than 4 times per year    Active Member of [employer] or Organizations: Yes    Attends Banker Meetings: 1 to 4 times per year    Marital Status: Divorced  Intimate Partner Violence: Not At Risk (01/04/2023)   Humiliation, Afraid, Rape, and Kick questionnaire    Fear of Current or Ex-Partner: No    Emotionally  Abused: No    Physically Abused: No    Sexually Abused: No   *** Family History  Problem Relation Age of Onset   Heart attack Mother    Stroke Mother    Diabetes Mother    Hypertension Father    Colon polyps Father    Cancer - Other Sister 1       neuroblastoma   Lung cancer Maternal Grandfather        d. 2s   Breast cancer Paternal Grandmother    Ovarian cancer Paternal Grandmother    Breast cancer Paternal Aunt        dx 36s   Uterine cancer Other        MGM's sister; dx before 58    Current Outpatient Medications  Medication Sig Dispense Refill   albuterol (PROVENTIL) (2.5 MG/3ML) 0.083% nebulizer solution Take 3 mLs (2.5 mg total) by nebulization every 4 (four) hours as needed for wheezing or shortness of breath (please include nebulizer machine, hoses, and mask if needed.). 185 mL 6    albuterol (VENTOLIN HFA) 108 (90 Base) MCG/ACT inhaler INHALE 2 PUFFS BY MOUTH EVERY 6 HOURS AS NEEDED FOR WHEEZING AND SHORTNESS OF BREATH 8.5 g 0   amphetamine-dextroamphetamine (ADDERALL XR) 10 MG 24 hr capsule Take 1 capsule (10 mg total) by mouth daily. 30 capsule 0   amphetamine-dextroamphetamine (ADDERALL XR) 30 MG 24 hr capsule Take 1 capsule (30 mg total) by mouth daily. 30 capsule 0   celecoxib (CELEBREX) 200 MG capsule Take 1 capsule (200 mg total) by mouth 2 (two) times daily. 180 capsule 3   Continuous Blood Gluc Sensor (DEXCOM G7 SENSOR) MISC Apply one device every 10 days as directed for continuous glucose measurement. 3 each 11   cyanocobalamin (VITAMIN B12) 1000 MCG/ML injection Inject 1 mL (1,000 mcg total) into the muscle every 30 (thirty) days. 1 mL 6   cyclobenzaprine (FLEXERIL) 5 MG tablet Take 5 mg by mouth at bedtime as needed for muscle spasms.     esomeprazole (NEXIUM) 40 MG capsule Take 40 mg by mouth 2 (two) times daily.     FLUoxetine (PROZAC) 20 MG capsule Take 1 capsule (20 mg total) by mouth daily. 90 capsule 3   Fluticasone-Umeclidin-Vilant (TRELEGY ELLIPTA) 200-62.5-25 MCG/ACT AEPB 1 inhalation daily. 180 each 3   glipiZIDE (GLUCOTROL) 5 MG tablet Take 1 tablet (5 mg total) by mouth 2 (two) times daily before a meal. 180 tablet 3   hydrochlorothiazide (HYDRODIURIL) 25 MG tablet Take 1 tablet (25 mg total) by mouth daily. 90 tablet 3   insulin degludec (TRESIBA FLEXTOUCH) 100 UNIT/ML FlexTouch Pen INJECT 10 UNITS SUBCUTANEOUSLY AT BEDTIME. INCREASE BY 2 UNITS EVERY 4 DAYS FOR BLOOD SUGAR HIGHER THAN 150 FASTING IN MORNING. Max dose 60 units per dose. 15 mL 3   Iron, Ferrous Sulfate, 325 (65 Fe) MG TABS Take 325 mg by mouth daily. 90 tablet 1   levothyroxine (SYNTHROID) 125 MCG tablet Take 2 tablets (250 mcg total) by mouth daily. 180 tablet 1   liothyronine (CYTOMEL) 5 MCG tablet Take 1 tablet (5 mcg total) by mouth 2 (two) times daily. 180 tablet 1   liraglutide  (VICTOZA) 18 MG/3ML SOPN Inject 0.6 mg into the skin daily. For a week if tolerates can move up to 1.2 mg  for a week and then to 1.8 mg once a day if side effects drop back down to previous 9 mL 0   losartan (COZAAR) 100 MG tablet Take 1 tablet (  100 mg total) by mouth daily. 90 tablet 3   metFORMIN (GLUCOPHAGE-XR) 500 MG 24 hr tablet TAKE 2 TABLETS BY MOUTH IN THE MORNING AND 2 TABLETS BY MOUTH AT BEDTIME 360 tablet 3   mycophenolate (CELLCEPT) 500 MG tablet Take by mouth.     ondansetron (ZOFRAN) 8 MG tablet Take 1 tablet (8 mg total) by mouth every 8 (eight) hours as needed for nausea or vomiting. 20 tablet 2   predniSONE (DELTASONE) 20 MG tablet Take by mouth.     riTUXimab-abbs (TRUXIMA) 500 MG/50ML injection 1000 MG Intravenous DAY 0 & DAY 15 THEN REPEAT COURSE EVERY 24 WEEKS THEREAFTER     rosuvastatin (CRESTOR) 40 MG tablet Take 1 tablet (40 mg total) by mouth daily. 90 tablet 3   valACYclovir (VALTREX) 1000 MG tablet Take 2 tabs on day one and 1 tab a day on day 2,3,and 4 for outbreak of cold sore. 18 tablet 3   venlafaxine XR (EFFEXOR XR) 75 MG 24 hr capsule Take 2 capsules (150 mg total) by mouth daily with breakfast. Taper down as directed. 60 capsule 3   Vitamin D, Ergocalciferol, (DRISDOL) 1.25 MG (50000 UNIT) CAPS capsule Take 1 capsule (50,000 Units total) by mouth every 7 (seven) days. 12 capsule 3   No current facility-administered medications for this visit.    No Known Allergies   REVIEW OF SYSTEMS:  *** [X]  denotes positive finding, [ ]  denotes negative finding Cardiac  Comments:  Chest pain or chest pressure:    Shortness of breath upon exertion:    Short of breath when lying flat:    Irregular heart rhythm:        Vascular    Pain in calf, thigh, or hip brought on by ambulation:    Pain in feet at night that wakes you up from your sleep:     Blood clot in your veins:    Leg swelling:         Pulmonary    Oxygen at home:    Productive cough:     Wheezing:          Neurologic    Sudden weakness in arms or legs:     Sudden numbness in arms or legs:     Sudden onset of difficulty speaking or slurred speech:    Temporary loss of vision in one eye:     Problems with dizziness:         Gastrointestinal    Blood in stool:     Vomited blood:         Genitourinary    Burning when urinating:     Blood in urine:        Psychiatric    Major depression:         Hematologic    Bleeding problems:    Problems with blood clotting too easily:        Skin    Rashes or ulcers:        Constitutional    Fever or chills:      PHYSICAL EXAMINATION:  There were no vitals filed for this visit.  General:  WDWN in NAD; vital signs documented above Gait: Not observed HENT: WNL, normocephalic Pulmonary: normal non-labored breathing , without wheezing Cardiac: {Desc; regular/irreg:14544} HR Abdomen: soft, NT, no masses Skin: {With/Without:20273} rashes Vascular Exam/Pulses:  Right Left  Radial {Exam; arterial pulse strength 0-4:30167} {Exam; arterial pulse strength 0-4:30167}  Ulnar {Exam; arterial pulse strength 0-4:30167} {Exam; arterial pulse strength 0-4:30167}  Femoral {Exam; arterial pulse strength 0-4:30167} {Exam; arterial pulse strength 0-4:30167}  Popliteal {Exam; arterial pulse strength 0-4:30167} {Exam; arterial pulse strength 0-4:30167}  DP {Exam; arterial pulse strength 0-4:30167} {Exam; arterial pulse strength 0-4:30167}  PT {Exam; arterial pulse strength 0-4:30167} {Exam; arterial pulse strength 0-4:30167}   Extremities: {With/Without:20273} ischemic changes, {With/Without:20273} Gangrene , {With/Without:20273} cellulitis; {With/Without:20273} open wounds;  Musculoskeletal: no muscle wasting or atrophy  Neurologic: A&O X 3;  No focal weakness or paresthesias are detected Psychiatric:  The pt has {Desc; normal/abnormal:11317::"Normal"} affect.   Non-Invasive Vascular Imaging:   ***    ASSESSMENT/PLAN: RAEGAN WINDERS is a  48 y.o. female presenting with ***   ***   Victorino Sparrow, MD [facility] [phone]

## 2023-02-21 ENCOUNTER — Encounter: Payer: 59 | Admitting: Vascular Surgery

## 2023-02-21 ENCOUNTER — Ambulatory Visit (HOSPITAL_COMMUNITY): Payer: 59 | Attending: Vascular Surgery

## 2023-02-23 ENCOUNTER — Other Ambulatory Visit (HOSPITAL_BASED_OUTPATIENT_CLINIC_OR_DEPARTMENT_OTHER): Payer: Self-pay | Admitting: Nurse Practitioner

## 2023-02-23 DIAGNOSIS — J4542 Moderate persistent asthma with status asthmaticus: Secondary | ICD-10-CM

## 2023-03-05 ENCOUNTER — Telehealth: Payer: Self-pay | Admitting: Nurse Practitioner

## 2023-03-05 NOTE — Telephone Encounter (Signed)
See 01/21/23 message, pt would like to go on Mounjaro since she now has Medicaid

## 2023-03-06 ENCOUNTER — Encounter: Payer: Self-pay | Admitting: Nurse Practitioner

## 2023-03-11 ENCOUNTER — Other Ambulatory Visit: Payer: Self-pay

## 2023-03-11 MED ORDER — MOUNJARO 2.5 MG/0.5ML ~~LOC~~ SOAJ: 2.5000 mg | SUBCUTANEOUS | 0 refills | Status: DC

## 2023-03-13 ENCOUNTER — Other Ambulatory Visit (HOSPITAL_COMMUNITY): Payer: Self-pay

## 2023-03-13 ENCOUNTER — Encounter: Payer: Self-pay | Admitting: Nurse Practitioner

## 2023-03-18 ENCOUNTER — Other Ambulatory Visit: Payer: 59

## 2023-03-21 NOTE — Telephone Encounter (Signed)
 Will you switch her Mounjaro to [employer]

## 2023-03-22 ENCOUNTER — Other Ambulatory Visit (HOSPITAL_COMMUNITY): Payer: Self-pay

## 2023-03-22 ENCOUNTER — Other Ambulatory Visit: Payer: Self-pay

## 2023-03-22 ENCOUNTER — Telehealth: Payer: Self-pay

## 2023-03-22 MED ORDER — MOUNJARO 2.5 MG/0.5ML ~~LOC~~ SOAJ: 2.5000 mg | SUBCUTANEOUS | 0 refills | Status: DC

## 2023-03-22 NOTE — Telephone Encounter (Signed)
 Melissa Massey

## 2023-03-22 NOTE — Telephone Encounter (Signed)
 Pharmacy Patient Advocate Encounter   Received notification from Physician's Office that prior authorization for Mounjaro 2.5MG /0.5ML auto-injectors  is required/requested.   Insurance verification completed.   The patient is insured through [facility] .   Per test claim: PA required; PA submitted to above mentioned insurance via CoverMyMeds Key/confirmation #/EOC (Key: [id number])     Status is pending

## 2023-03-22 NOTE — Telephone Encounter (Signed)
 Called pharmacy & went thru for $4, pt informed

## 2023-03-25 ENCOUNTER — Other Ambulatory Visit (HOSPITAL_COMMUNITY): Payer: Self-pay

## 2023-03-25 NOTE — Telephone Encounter (Signed)
 Pharmacy Patient Advocate Encounter  Received notification from [facility] that Prior Authorization for [facility] 2.5 has been APPROVED from 2.28.25 to 2.28.26. Ran test claim, Copay is $4.00. This test claim was processed through [facility]- copay amounts may vary at other pharmacies due to pharmacy/plan contracts, or as the patient moves through the different stages of their insurance plan.

## 2023-03-28 ENCOUNTER — Encounter: Payer: Self-pay | Admitting: Nurse Practitioner

## 2023-03-28 ENCOUNTER — Other Ambulatory Visit (HOSPITAL_COMMUNITY): Payer: Self-pay

## 2023-03-28 ENCOUNTER — Telehealth: Payer: Self-pay

## 2023-03-28 NOTE — Telephone Encounter (Signed)
 Pharmacy Patient Advocate Encounter   Received notification from CoverMyMeds that prior authorization for Trelegy Ellipta 200-62.5-25MCG/ACT aerosol powder is required/requested.   Insurance verification completed.   The patient is insured through [facility] .   Per test claim: PA required; PA submitted to above mentioned insurance via CoverMyMeds Key/confirmation #/EOC (Key: [id number])      Status is pending

## 2023-03-28 NOTE — Telephone Encounter (Signed)
 I have received a request for a Prior Authorization for Trelegy Ellipta 200-62.5-25MCG/ACT aerosol powder. Per the pts pref'd pharmacy the pt has been filling the Rx via her Primary Insurance ([employer]) But due to pts coinsurnce the pt copay amt is 654.48. I have done a Prior Authorization through her medicaid to see if this will lower her cost.       Status is pending

## 2023-03-29 ENCOUNTER — Other Ambulatory Visit (HOSPITAL_COMMUNITY): Payer: Self-pay

## 2023-03-29 NOTE — Telephone Encounter (Signed)
 Pharmacy Patient Advocate Encounter  Received notification from [facility] that Prior Authorization for TRELEGY ELLIPTA 200/62.5 has been DENIED.  Full denial letter will be uploaded to the media tab. See denial reason below.     PT has alternate pharmacy benefits/another primary payer and will need to be submitted via that Ins

## 2023-04-01 ENCOUNTER — Encounter: Payer: Self-pay | Admitting: Internal Medicine

## 2023-04-01 NOTE — Telephone Encounter (Signed)
 Pts secondary insurance wont cover Rx due to her having an alternate -pharmacy benefits as the primary payer.

## 2023-04-03 ENCOUNTER — Telehealth: Payer: Self-pay

## 2023-04-03 ENCOUNTER — Other Ambulatory Visit (HOSPITAL_COMMUNITY): Payer: Self-pay

## 2023-04-03 NOTE — Telephone Encounter (Signed)
 Per first Denial with pts medicaid'' The pt has alternate Insurance''. However the pt states she doesn't have another insurance''. I have resubmitted another P/A to see if we can get an approval'' as mentioned in the previous encounter if we received a denial again for the same reason'' the pt will need to contact her medicaid plan to inform them that she doesn't have alternate insurance   New Key: [id number]

## 2023-04-04 NOTE — Telephone Encounter (Signed)
 Hi Melissa Massey,         I have received the same denial. The pts ''Medicaid'' is still picking up an alternate insurance. Please see below where she will need to call Her medicaid plan at [phone] to have this alternate Insurance removed.      Marland Kitchen

## 2023-04-08 ENCOUNTER — Encounter: Payer: Self-pay | Admitting: Nurse Practitioner

## 2023-04-08 ENCOUNTER — Telehealth: Payer: Self-pay

## 2023-04-08 ENCOUNTER — Other Ambulatory Visit (HOSPITAL_COMMUNITY): Payer: Self-pay

## 2023-04-08 NOTE — Telephone Encounter (Signed)
 Pharmacy Patient Advocate Encounter   Received notification from CoverMyMeds that prior authorization for Amphetamine-Dextroamphet ER 20MG  er capsules is required/requested.   Insurance verification completed.   The patient is insured through [facility] .   Per test claim: PA required; PA submitted to above mentioned insurance via CoverMyMeds Key/confirmation #/EOC (Key: [id number])   Status is pending

## 2023-04-09 ENCOUNTER — Other Ambulatory Visit (HOSPITAL_COMMUNITY): Payer: Self-pay

## 2023-04-09 NOTE — Telephone Encounter (Signed)
 Pharmacy Patient Advocate Encounter  Received notification from [facility] that Prior Authorization for Amphetamine-Dextroamphet ER 20MG  er capsules has been APPROVED from 3.17.25 to 3.17.26. Ran test claim, Copay is $RTS, RX WAS LAST FILLED 3.18.25. This test claim was processed through [facility]- copay amounts may vary at other pharmacies due to pharmacy/plan contracts, or as the patient moves through the different stages of their insurance plan.   PA #/Case ID/Reference #: (Key: [id number])

## 2023-04-18 ENCOUNTER — Other Ambulatory Visit: Payer: Self-pay | Admitting: Nurse Practitioner

## 2023-04-26 ENCOUNTER — Other Ambulatory Visit (HOSPITAL_COMMUNITY): Payer: Self-pay

## 2023-04-26 ENCOUNTER — Telehealth: Payer: Self-pay

## 2023-04-26 ENCOUNTER — Telehealth: Payer: Self-pay | Admitting: Nurse Practitioner

## 2023-04-26 ENCOUNTER — Other Ambulatory Visit: Payer: Self-pay | Admitting: Nurse Practitioner

## 2023-04-26 DIAGNOSIS — E1165 Type 2 diabetes mellitus with hyperglycemia: Secondary | ICD-10-CM

## 2023-04-26 MED ORDER — TRULICITY 3 MG/0.5ML ~~LOC~~ SOAJ: 3.0000 mg | SUBCUTANEOUS | 0 refills | Status: DC

## 2023-04-26 MED ORDER — TRULICITY 0.75 MG/0.5ML ~~LOC~~ SOAJ: 0.7500 mg | SUBCUTANEOUS | 0 refills | Status: DC

## 2023-04-26 MED ORDER — TRULICITY 1.5 MG/0.5ML ~~LOC~~ SOAJ: 1.5000 mg | SUBCUTANEOUS | 0 refills | Status: DC

## 2023-04-26 MED ORDER — TRULICITY 4.5 MG/0.5ML ~~LOC~~ SOAJ: 4.5000 mg | SUBCUTANEOUS | 3 refills | Status: DC

## 2023-04-26 NOTE — Telephone Encounter (Signed)
 Pharmacy Patient Advocate Encounter   Received notification from Physician's Office that prior authorization for Trulicity 0.75mg /0.5ML auto-injectors is required/requested.   Insurance verification completed.   The patient is insured through [facility] .   Per test claim: PA required; PA submitted to above mentioned insurance via CoverMyMeds Key/confirmation #/EOC Key: [id number]   Status is pending     Per test claim NO P/a Is required. However the patient is required to pay 100% of her pt coinsurance. This leaves her cost around/about  953.73

## 2023-04-26 NOTE — Telephone Encounter (Addendum)
 Pharmacy Patient Advocate Encounter   Received notification from Physician's Office that prior authorization for Trulicity 0.75mg /0.5ML auto-injectors is required/requested.   Insurance verification completed.   The patient is insured through [facility] .   Per test claim: PA required; PA submitted to above mentioned insurance via CoverMyMeds Key/confirmation #/EOC Key: [id number]   Status is pending     Per test claim NO P/a Is required. However the patient is required to pay 100% of her pt coinsurance. This leaves her cost around/about  953.73

## 2023-04-26 NOTE — Telephone Encounter (Signed)
 Please call Melissa Massey to let her know we received a message that her insurance requires her to try Trulicity before Mounjaro. I have sent this into the [facility] pharmacy for her with the increasing doses for 4 months.   If she has any issues with the medication or is unable to tolerate this, ask her to let us know so we can document this and see about getting something else covered.

## 2023-05-20 ENCOUNTER — Telehealth: Payer: Self-pay

## 2023-05-20 NOTE — Telephone Encounter (Signed)
 Refill Request Trulicity 100 unit inj Last fill date 04/29/2023

## 2023-05-21 ENCOUNTER — Other Ambulatory Visit: Payer: Self-pay | Admitting: Nurse Practitioner

## 2023-05-21 DIAGNOSIS — F419 Anxiety disorder, unspecified: Secondary | ICD-10-CM

## 2023-05-21 NOTE — Telephone Encounter (Signed)
 Last apt 01/04/23.

## 2023-05-22 ENCOUNTER — Other Ambulatory Visit (HOSPITAL_COMMUNITY): Payer: Self-pay

## 2023-05-23 ENCOUNTER — Encounter: Payer: Self-pay | Admitting: Nurse Practitioner

## 2023-05-23 ENCOUNTER — Other Ambulatory Visit (HOSPITAL_COMMUNITY): Payer: Self-pay

## 2023-05-26 ENCOUNTER — Ambulatory Visit (HOSPITAL_BASED_OUTPATIENT_CLINIC_OR_DEPARTMENT_OTHER)
Admission: RE | Admit: 2023-05-26 | Discharge: 2023-05-26 | Disposition: A | Discharge status: Home / Self Care | Source: Ambulatory Visit | Attending: Nurse Practitioner | Admitting: Nurse Practitioner

## 2023-05-26 ENCOUNTER — Ambulatory Visit
Admission: EM | Admit: 2023-05-26 | Discharge: 2023-05-26 | Disposition: A | Discharge status: Home / Self Care | Attending: Nurse Practitioner | Admitting: Nurse Practitioner

## 2023-05-26 ENCOUNTER — Encounter: Payer: Self-pay | Admitting: Nurse Practitioner

## 2023-05-26 ENCOUNTER — Encounter: Payer: Self-pay | Admitting: Emergency Medicine

## 2023-05-26 DIAGNOSIS — M79671 Pain in right foot: Secondary | ICD-10-CM | POA: Diagnosis present

## 2023-05-26 DIAGNOSIS — S92351A Displaced fracture of fifth metatarsal bone, right foot, initial encounter for closed fracture: Secondary | ICD-10-CM | POA: Insufficient documentation

## 2023-05-26 DIAGNOSIS — S92341A Displaced fracture of fourth metatarsal bone, right foot, initial encounter for closed fracture: Secondary | ICD-10-CM | POA: Insufficient documentation

## 2023-05-26 DIAGNOSIS — R2241 Localized swelling, mass and lump, right lower limb: Secondary | ICD-10-CM

## 2023-05-26 DIAGNOSIS — X58XXXA Exposure to other specified factors, initial encounter: Secondary | ICD-10-CM | POA: Insufficient documentation

## 2023-05-26 DIAGNOSIS — M19071 Primary osteoarthritis, right ankle and foot: Secondary | ICD-10-CM | POA: Insufficient documentation

## 2023-05-26 DIAGNOSIS — M7989 Other specified soft tissue disorders: Secondary | ICD-10-CM | POA: Insufficient documentation

## 2023-05-26 NOTE — ED Provider Notes (Signed)
 [name]    CSN: [id number] Arrival date & time: 05/26/23  1529      History   Chief Complaint Chief Complaint  Patient presents with   Foot Pain    HPI Melissa Massey is a 48 y.o. female.   Melissa Massey is a 48 year old female presenting with swelling to the right foot that has been ongoing for the past couple of weeks. She denies any known injury. Over the past 2 to 3 days, she has experienced worsening pain and increased swelling in the affected foot. Her medical history is significant for diabetes and rheumatoid arthritis. She is on chronic Celebrex  and prednisone  therapy. She has attempted conservative measures including ice, elevation, and compression socks without significant relief. She also trialed wearing a CAM boot but noted no improvement in her symptoms. The swelling has become significant enough to prevent her from wearing a regular shoe. She denies chest pain, shortness of breath, ankle or leg swelling, and has no pain or weakness in the legs. She reports no loss of sensation or inability to bear weight on the right lower extremity.  The following portions of the patient's history were reviewed and updated as appropriate: allergies, current medications, past family history, past medical history, past social history, past surgical history, and problem list.         Past Medical History:  Diagnosis Date   Arthritis    Asthma    CHF (congestive heart failure) (HCC)    Diabetes mellitus without complication (HCC)    DVT (deep venous thrombosis) (HCC)    Graves disease    Hyperlipidemia    Hypertension    PCOS (polycystic ovarian syndrome)    Rheumatoid arthritis (HCC)     Patient Active Problem List   Diagnosis Date Noted   Degeneration of lumbar intervertebral disc 01/04/2023   Genetic testing 02/16/2022   Multiple pulmonary nodules 02/02/2022   Grief reaction 02/02/2022   Severe anxiety with panic 02/02/2022   Psychophysiological  insomnia 02/02/2022   Diverticulosis 12/24/2021   Hashimoto's disease 12/24/2021   Iron  deficiency 06/14/2021   Diverticulitis 06/09/2021   Vitamin B 12 deficiency 05/30/2021   Chronic congestive heart failure (HCC) 05/30/2021   Depression, recurrent (HCC) 05/30/2021   Attention deficit hyperactivity disorder (ADHD), 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/2020   Rheumatoid arthritis involving multiple sites with positive rheumatoid factor (HCC) 12/12/2020   Asthma in adult, moderate persistent, with status asthmaticus 02/03/2020   Vitamin D  deficiency 11/13/2017   Anxiety 08/21/2017   Lower extremity edema 06/01/2016   Type 2 diabetes mellitus with hyperglycemia, without long-term current use of insulin (HCC) 06/01/2016   Graves disease 03/29/2016   Fatty liver 04/09/2014   Hepatosplenomegaly 04/09/2014   Obstructive sleep apnea syndrome, severe 03/25/2014   Hypersomnia 03/05/2014   Severe obesity (BMI >= 40) (HCC) 03/05/2014   Hypertension associated with diabetes (HCC) 01/12/2014   Gastroesophageal reflux disease without esophagitis 01/12/2014   History of back surgery 01/12/2014   Polycystic ovarian syndrome 01/12/2014   Postoperative hypothyroidism 01/12/2014    Past Surgical History:  Procedure Laterality Date   CESAREAN SECTION     x2   LUMBAR FUSION     TUBAL LIGATION      OB History   No obstetric history on file.      Home Medications    Prior to Admission medications   Medication Sig Start Date End Date Taking? Authorizing Provider  albuterol  (PROVENTIL ) (2.5 MG/3ML) 0.083% nebulizer solution Take 3 mLs (  2.5 mg total) by nebulization every 4 (four) hours as needed for wheezing or shortness of breath (please include nebulizer machine, hoses, and mask if needed.). 05/30/21   Early, Sara E, NP  albuterol  (VENTOLIN  HFA) 108 (90 Base) MCG/ACT inhaler INHALE 2 PUFFS BY MOUTH EVERY 6 HOURS AS NEEDED FOR WHEEZING AND FOR SHORTNESS OF BREATH 02/25/23   Early, Adriane Albe, NP   amphetamine -dextroamphetamine (ADDERALL XR) 10 MG 24 hr capsule Take 1 capsule (10 mg total) by mouth daily. 01/04/23   Annella Kief, NP  amphetamine -dextroamphetamine (ADDERALL XR) 30 MG 24 hr capsule Take 1 capsule (30 mg total) by mouth daily. 01/04/23   Early, Sara E, NP  celecoxib  (CELEBREX ) 200 MG capsule Take 1 capsule (200 mg total) by mouth 2 (two) times daily. 01/04/23   Early, Sara E, NP  Continuous Blood Gluc Sensor (DEXCOM G7 SENSOR) MISC Apply one device every 10 days as directed for continuous glucose measurement. 02/02/22   Early, Sara E, NP  cyanocobalamin  (VITAMIN B12) 1000 MCG/ML injection Inject 1 mL (1,000 mcg total) into the muscle every 30 (thirty) days. 07/09/22   Burton, Lacie K, NP  cyclobenzaprine (FLEXERIL) 5 MG tablet Take 5 mg by mouth at bedtime as needed for muscle spasms. 12/27/20   [provider]  Dulaglutide (TRULICITY) 0.75 MG/0.5ML SOAJ Inject 0.75 mg into the skin once a week. 04/26/23   Early, Sara E, NP  Dulaglutide (TRULICITY) 1.5 MG/0.5ML SOAJ Inject 1.5 mg into the skin once a week. 05/21/23   Early, Sara E, NP  Dulaglutide (TRULICITY) 3 MG/0.5ML SOAJ Inject 3 mg as directed once a week. 06/15/23   Early, Sara E, NP  Dulaglutide (TRULICITY) 4.5 MG/0.5ML SOAJ Inject 4.5 mg as directed once a week. 07/10/23   Early, Sara E, NP  esomeprazole (NEXIUM) 40 MG capsule Take 40 mg by mouth 2 (two) times daily. 11/29/20   [provider]  Fluticasone-Umeclidin-Vilant (TRELEGY ELLIPTA ) 200-62.5-25 MCG/ACT AEPB 1 inhalation daily. 01/04/23   Early, Sara E, NP  glipiZIDE  (GLUCOTROL ) 5 MG tablet Take 1 tablet (5 mg total) by mouth 2 (two) times daily before a meal. 01/04/23   Early, Adriane Albe, NP  hydrochlorothiazide  (HYDRODIURIL ) 25 MG tablet Take 1 tablet (25 mg total) by mouth daily. 01/04/23   Early, Sara E, NP  insulin degludec  (TRESIBA  FLEXTOUCH) 100 UNIT/ML FlexTouch Pen INJECT 10 UNITS SUBCUTANEOUSLY AT BEDTIME. INCREASE BY 2 UNITS EVERY 4 DAYS FOR BLOOD  SUGAR HIGHER THAN 150 FASTING IN MORNING. Max dose 60 units per dose. 01/08/23   Annella Kief, NP  Iron , Ferrous Sulfate , 325 (65 Fe) MG TABS Take 325 mg by mouth daily. 06/13/21   Early, Sara E, NP  levothyroxine  (SYNTHROID ) 125 MCG tablet Take 2 tablets (250 mcg total) by mouth daily. 01/04/23   Early, Sara E, NP  liothyronine  (CYTOMEL ) 5 MCG tablet Take 1 tablet (5 mcg total) by mouth 2 (two) times daily. 01/04/23   Early, Sara E, NP  losartan  (COZAAR ) 100 MG tablet Take 1 tablet (100 mg total) by mouth daily. 01/04/23   Early, Sara E, NP  metFORMIN  (GLUCOPHAGE -XR) 500 MG 24 hr tablet TAKE 2 TABLETS BY MOUTH IN THE MORNING AND 2 TABLETS BY MOUTH AT BEDTIME 01/04/23   Early, Sara E, NP  ondansetron  (ZOFRAN ) 8 MG tablet Take 1 tablet (8 mg total) by mouth every 8 (eight) hours as needed for nausea or vomiting. 06/09/21   Early, Adriane Albe, NP  predniSONE  (DELTASONE ) 20 MG tablet Take by  mouth.    [provider]  riTUXimab -abbs (TRUXIMA ) 500 MG/50ML injection 1000 MG Intravenous DAY 0 & DAY 15 THEN REPEAT COURSE EVERY 24 WEEKS THEREAFTER    [provider]  rosuvastatin  (CRESTOR ) 40 MG tablet Take 1 tablet (40 mg total) by mouth daily. 01/17/23   Early, Sara E, NP  valACYclovir  (VALTREX ) 1000 MG tablet Take 2 tabs on day one and 1 tab a day on day 2,3,and 4 for outbreak of cold sore. 01/04/23   Early, Sara E, NP  venlafaxine  XR (EFFEXOR -XR) 75 MG 24 hr capsule TAKE 2 CAPSULES BY MOUTH ONCE DAILY WITH BREAKFAST. TAPER  DOWN  AS  DIRECTED 05/22/23   Early, Sara E, NP  Vitamin D , Ergocalciferol , (DRISDOL ) 1.25 MG (50000 UNIT) CAPS capsule Take 1 capsule (50,000 Units total) by mouth every 7 (seven) days. 01/17/23   Early, Adriane Albe, NP    Family History Family History  Problem Relation Age of Onset   Heart attack Mother    Stroke Mother    Diabetes Mother    Hypertension Father    Colon polyps Father    Cancer - Other Sister 1       neuroblastoma   Lung cancer Maternal Grandfather         d. 55s   Breast cancer Paternal Grandmother    Ovarian cancer Paternal Grandmother    Breast cancer Paternal Aunt        dx 51s   Uterine cancer Other        MGM's sister; dx before 22    Social History Social History   Tobacco Use   Smoking status: Former    Current packs/day: 1.00    Types: Cigarettes   Smokeless tobacco: Never  Vaping Use   Vaping status: Former   Substances: Nicotine  Substance Use Topics   Alcohol use: No   Drug use: No     Allergies   Patient has no known allergies.   Review of Systems Review of Systems  Constitutional:  Negative for fever.  Musculoskeletal:  Positive for arthralgias.  Neurological:  Negative for weakness and numbness.  All other systems reviewed and are negative.    Physical Exam Triage Vital Signs ED Triage Vitals  Encounter Vitals Group     BP      Systolic BP Percentile      Diastolic BP Percentile      Pulse      Resp      Temp      Temp src      SpO2      Weight      Height      Head Circumference      Peak Flow      Pain Score      Pain Loc      Pain Education      Exclude from Growth Chart    No data found.  Updated Vital Signs There were no vitals taken for this visit.  Visual Acuity Right Eye Distance:   Left Eye Distance:   Bilateral Distance:    Right Eye Near:   Left Eye Near:    Bilateral Near:     Physical Exam Vitals reviewed.  Constitutional:      General: She is not in acute distress.    Appearance: Normal appearance. She is well-developed. She is obese. She is not ill-appearing or toxic-appearing.  HENT:     Head: Normocephalic.     Mouth/Throat:  Mouth: Mucous membranes are moist.  Eyes:     Conjunctiva/sclera: Conjunctivae normal.  Cardiovascular:     Rate and Rhythm: Normal rate and regular rhythm.     Pulses:          Posterior tibial pulses are 2+ on the right side.     Heart sounds: Normal heart sounds.  Pulmonary:     Effort: Pulmonary effort is normal.      Breath sounds: Normal breath sounds.  Musculoskeletal:        General: Normal range of motion.     Right foot: Normal range of motion and normal capillary refill. Swelling and tenderness present.     Comments: Non-pitting edema localized to the dorsal aspect of the right foot.  Feet:     Right foot:     Skin integrity: Skin integrity normal. No ulcer, skin breakdown or erythema.     Toenail Condition: Right toenails are normal.  Skin:    General: Skin is warm and dry.  Neurological:     General: No focal deficit present.     Mental Status: She is alert and oriented to person, place, and time.  Psychiatric:        Behavior: Behavior is cooperative.      UC Treatments / Results  Labs (all labs ordered are listed, but only abnormal results are displayed) Labs Reviewed - No data to display  EKG   Radiology No results found.  Procedures Procedures (including critical care time)  Medications Ordered in UC Medications - No data to display  Initial Impression / Assessment and Plan / UC Course  I have reviewed the triage vital signs and the nursing notes.  Pertinent labs & imaging results that were available during my care of the patient were reviewed by me and considered in my medical decision making (see chart for details).    48 year old female presenting with right foot swelling and pain. She is afebrile, nontoxic, and obese. Physical exam revealed non-pitting edema localized to the dorsal aspect of the right foot. No erythema, skin breakdown, or ulcerations were observed, including between the toes. The etiology of the swelling is unclear at this time. X-ray imaging is not available at this facility, so an X-ray has been ordered to be completed at [facility]. The patient will be contacted if results are abnormal; otherwise, she may view the results on MyChart. She was placed in a post-op shoe for support. Supportive care measures were reviewed, including rest,  elevation, and limited weight-bearing. Follow-up with her primary care provider and/or orthopedics as needed.  Today's evaluation has revealed no signs of a dangerous process. Discussed diagnosis with patient and/or guardian. Patient and/or guardian aware of their diagnosis, possible red flag symptoms to watch out for and need for close follow up. Patient and/or guardian understands verbal and written discharge instructions. Patient and/or guardian comfortable with plan and disposition.  Patient and/or guardian has a clear mental status at this time, good insight into illness (after discussion and teaching) and has clear judgment to make decisions regarding their care  Documentation was completed with the aid of voice recognition software. Transcription may contain typographical errors. Final Clinical Impressions(s) / UC Diagnoses   Final diagnoses:  Acute pain of right foot  Localized swelling of right foot     Discharge Instructions      You were seen today for evaluation of foot pain and swelling that has been ongoing for a couple of weeks. There is no  history of injury, and no signs of infection were noted. On exam, there were no open wounds, lesions, or sores.  A foot X-ray has been ordered to further evaluate the cause of your symptoms. Please go to [facility] Emergency Department to have this imaging completed. You will only be contacted if the results are abnormal. Otherwise, you can view the results through your MyChart account.  You have been placed in a post-op shoe for support and stabilization. Please continue supportive care measures, including rest, elevation, ice, and over-the-counter pain medications as needed. Avoid prolonged standing or walking and limit weight-bearing on the affected foot as much as possible.  Follow up with your primary care provider or an orthopedic specialist for further evaluation and management, especially if symptoms do not improve or  worsen.     ED Prescriptions   None    PDMP not reviewed this encounter.   Maryruth Sol, [place] 05/26/23 1558

## 2023-05-26 NOTE — Discharge Instructions (Addendum)
 You were seen today for evaluation of foot pain and swelling that has been ongoing for a couple of weeks. There is no history of injury, and no signs of infection were noted. On exam, there were no open wounds, lesions, or sores.  A foot X-ray has been ordered to further evaluate the cause of your symptoms. Please go to [facility] Emergency Department to have this imaging completed. You will only be contacted if the results are abnormal. Otherwise, you can view the results through your MyChart account.  You have been placed in a post-op shoe for support and stabilization. Please continue supportive care measures, including rest, elevation, ice, and over-the-counter pain medications as needed. Avoid prolonged standing or walking and limit weight-bearing on the affected foot as much as possible.  Follow up with your primary care provider or an orthopedic specialist for further evaluation and management, especially if symptoms do not improve or worsen.

## 2023-05-26 NOTE — ED Triage Notes (Signed)
 Pt c/o foot pain to right foot x several weeks. Pt denies injury and/or fall.

## 2023-05-29 ENCOUNTER — Telehealth: Payer: Self-pay

## 2023-05-29 NOTE — Telephone Encounter (Signed)
 Hi Melissa Massey,               I received a P/a request for Tresiba (Flextouch) Insulin. Im not sure if I need to disregard this request. I see the pt has mounjaro  and trulicity on her profile as well. Please advise

## 2023-05-29 NOTE — Telephone Encounter (Signed)
 Okay, Perfect! Thank you Melissa Massey

## 2023-05-31 ENCOUNTER — Other Ambulatory Visit (HOSPITAL_BASED_OUTPATIENT_CLINIC_OR_DEPARTMENT_OTHER): Payer: Self-pay | Admitting: Nurse Practitioner

## 2023-05-31 DIAGNOSIS — J4542 Moderate persistent asthma with status asthmaticus: Secondary | ICD-10-CM

## 2023-06-03 ENCOUNTER — Telehealth: Payer: Self-pay | Admitting: Nurse Practitioner

## 2023-06-03 ENCOUNTER — Telehealth: Payer: Self-pay | Admitting: Pharmacy Technician

## 2023-06-03 ENCOUNTER — Encounter: Payer: Self-pay | Admitting: Nurse Practitioner

## 2023-06-03 ENCOUNTER — Other Ambulatory Visit (HOSPITAL_COMMUNITY): Payer: Self-pay

## 2023-06-03 NOTE — Telephone Encounter (Signed)
 Pharmacy Patient Advocate Encounter   Received notification from Onbase that prior authorization for Albuterol  Sulfate HFA 108 (90 Base)MCG/ACT aerosol is required/requested.   Insurance verification completed.   The patient is insured through [facility] .   Per test claim:  BRAND NAME VENTOLIN  HFA is preferred by the insurance.  If suggested medication is appropriate, Please send in a new RX and discontinue this one. If not, please advise as to why it's not appropriate so that we may request a Prior Authorization. Please note, some preferred medications may still require a PA.  If the suggested medications have not been trialed and there are no contraindications to their use, the PA will not be submitted, as it will not be approved.

## 2023-06-03 NOTE — Telephone Encounter (Signed)
 Fax from [facility]  [employer]  [address]  [address]  Last filled  04/24/2023

## 2023-06-04 ENCOUNTER — Other Ambulatory Visit: Payer: Self-pay

## 2023-06-04 ENCOUNTER — Other Ambulatory Visit: Payer: Self-pay | Admitting: Nurse Practitioner

## 2023-06-04 DIAGNOSIS — F900 Attention-deficit hyperactivity disorder, predominantly inattentive type: Secondary | ICD-10-CM

## 2023-06-04 DIAGNOSIS — G4710 Hypersomnia, unspecified: Secondary | ICD-10-CM

## 2023-06-04 MED ORDER — ALBUTEROL SULFATE HFA 108 (90 BASE) MCG/ACT IN AERS: 2.0000 | INHALATION_SPRAY | Freq: Four times a day (QID) | RESPIRATORY_TRACT | 1 refills | Status: DC | PRN

## 2023-06-04 MED ORDER — ESOMEPRAZOLE MAGNESIUM 40 MG PO CPDR: 40.0000 mg | DELAYED_RELEASE_CAPSULE | Freq: Two times a day (BID) | ORAL | 5 refills | Status: DC

## 2023-06-04 NOTE — Telephone Encounter (Signed)
 Last apt 01/04/23.

## 2023-06-05 MED ORDER — AMPHETAMINE-DEXTROAMPHET ER 30 MG PO CP24: 30.0000 mg | ORAL_CAPSULE | Freq: Every day | ORAL | 0 refills | Status: DC

## 2023-06-11 ENCOUNTER — Encounter: Payer: 59 | Admitting: Nurse Practitioner

## 2023-06-20 DIAGNOSIS — H15001 Unspecified scleritis, right eye: Secondary | ICD-10-CM | POA: Insufficient documentation

## 2023-06-23 ENCOUNTER — Other Ambulatory Visit (HOSPITAL_BASED_OUTPATIENT_CLINIC_OR_DEPARTMENT_OTHER): Payer: Self-pay | Admitting: Nurse Practitioner

## 2023-06-23 DIAGNOSIS — J4542 Moderate persistent asthma with status asthmaticus: Secondary | ICD-10-CM

## 2023-06-24 ENCOUNTER — Encounter: Payer: Self-pay | Admitting: Nurse Practitioner

## 2023-06-26 ENCOUNTER — Other Ambulatory Visit (HOSPITAL_BASED_OUTPATIENT_CLINIC_OR_DEPARTMENT_OTHER): Payer: Self-pay | Admitting: Nurse Practitioner

## 2023-06-26 DIAGNOSIS — J4542 Moderate persistent asthma with status asthmaticus: Secondary | ICD-10-CM

## 2023-06-26 NOTE — Telephone Encounter (Unsigned)
 Copied from CRM [id number]. Topic: Clinical - Prescription Issue >> Jun 26, 2023  4:35 PM Melissa Massey wrote: Reason for CRM: Melissa Massey with the prior authorization department at  [facility] has called regarding prior authorizations received for the patient's prescription of Trulicity and would like to please be contacted at [phone] when possible

## 2023-06-27 ENCOUNTER — Other Ambulatory Visit (HOSPITAL_COMMUNITY): Payer: Self-pay

## 2023-06-27 ENCOUNTER — Telehealth: Payer: Self-pay | Admitting: Pharmacy Technician

## 2023-06-27 NOTE — Telephone Encounter (Signed)
 Pharmacy Patient Advocate Encounter  Received notification from [facility] [employer] [place] [employer] that Prior Authorization for TRULICITY 1.5MG /0.5ML AUTO-INJECTORS has been APPROVED from 06/26/2023 to 06/25/2024.

## 2023-07-01 ENCOUNTER — Ambulatory Visit: Admitting: Nurse Practitioner

## 2023-07-01 ENCOUNTER — Encounter: Payer: Self-pay | Admitting: Nurse Practitioner

## 2023-07-01 ENCOUNTER — Ambulatory Visit
Admission: RE | Admit: 2023-07-01 | Discharge: 2023-07-01 | Disposition: A | Discharge status: Home / Self Care | Source: Ambulatory Visit | Attending: Nurse Practitioner | Admitting: Nurse Practitioner

## 2023-07-01 VITALS — BP 124/80 | HR 76 | Ht 66.0 in | Wt 278.8 lb

## 2023-07-01 DIAGNOSIS — J4542 Moderate persistent asthma with status asthmaticus: Secondary | ICD-10-CM

## 2023-07-01 DIAGNOSIS — R531 Weakness: Secondary | ICD-10-CM

## 2023-07-01 DIAGNOSIS — G4733 Obstructive sleep apnea (adult) (pediatric): Secondary | ICD-10-CM

## 2023-07-01 DIAGNOSIS — R918 Other nonspecific abnormal finding of lung field: Secondary | ICD-10-CM

## 2023-07-01 DIAGNOSIS — Z794 Long term (current) use of insulin: Secondary | ICD-10-CM

## 2023-07-01 DIAGNOSIS — R296 Repeated falls: Secondary | ICD-10-CM

## 2023-07-01 DIAGNOSIS — D649 Anemia, unspecified: Secondary | ICD-10-CM | POA: Insufficient documentation

## 2023-07-01 DIAGNOSIS — E1165 Type 2 diabetes mellitus with hyperglycemia: Secondary | ICD-10-CM | POA: Diagnosis not present

## 2023-07-01 DIAGNOSIS — E1159 Type 2 diabetes mellitus with other circulatory complications: Secondary | ICD-10-CM | POA: Diagnosis not present

## 2023-07-01 DIAGNOSIS — M5412 Radiculopathy, cervical region: Secondary | ICD-10-CM

## 2023-07-01 DIAGNOSIS — Z0000 Encounter for general adult medical examination without abnormal findings: Secondary | ICD-10-CM

## 2023-07-01 DIAGNOSIS — H04123 Dry eye syndrome of bilateral lacrimal glands: Secondary | ICD-10-CM | POA: Insufficient documentation

## 2023-07-01 DIAGNOSIS — I509 Heart failure, unspecified: Secondary | ICD-10-CM

## 2023-07-01 DIAGNOSIS — M546 Pain in thoracic spine: Secondary | ICD-10-CM

## 2023-07-01 DIAGNOSIS — I152 Hypertension secondary to endocrine disorders: Secondary | ICD-10-CM

## 2023-07-01 DIAGNOSIS — K760 Fatty (change of) liver, not elsewhere classified: Secondary | ICD-10-CM

## 2023-07-01 DIAGNOSIS — M0579 Rheumatoid arthritis with rheumatoid factor of multiple sites without organ or systems involvement: Secondary | ICD-10-CM

## 2023-07-01 DIAGNOSIS — H04129 Dry eye syndrome of unspecified lacrimal gland: Secondary | ICD-10-CM | POA: Insufficient documentation

## 2023-07-01 DIAGNOSIS — E559 Vitamin D deficiency, unspecified: Secondary | ICD-10-CM

## 2023-07-01 DIAGNOSIS — E611 Iron deficiency: Secondary | ICD-10-CM

## 2023-07-01 DIAGNOSIS — E538 Deficiency of other specified B group vitamins: Secondary | ICD-10-CM

## 2023-07-01 DIAGNOSIS — F900 Attention-deficit hyperactivity disorder, predominantly inattentive type: Secondary | ICD-10-CM

## 2023-07-01 DIAGNOSIS — F339 Major depressive disorder, recurrent, unspecified: Secondary | ICD-10-CM

## 2023-07-01 DIAGNOSIS — E1139 Type 2 diabetes mellitus with other diabetic ophthalmic complication: Secondary | ICD-10-CM

## 2023-07-01 DIAGNOSIS — E0500 Thyrotoxicosis with diffuse goiter without thyrotoxic crisis or storm: Secondary | ICD-10-CM

## 2023-07-01 DIAGNOSIS — E785 Hyperlipidemia, unspecified: Secondary | ICD-10-CM

## 2023-07-01 DIAGNOSIS — E1169 Type 2 diabetes mellitus with other specified complication: Secondary | ICD-10-CM

## 2023-07-01 DIAGNOSIS — G4710 Hypersomnia, unspecified: Secondary | ICD-10-CM

## 2023-07-01 DIAGNOSIS — K5792 Diverticulitis of intestine, part unspecified, without perforation or abscess without bleeding: Secondary | ICD-10-CM

## 2023-07-01 MED ORDER — BREZTRI AEROSPHERE 160-9-4.8 MCG/ACT IN AERO: 2.0000 | INHALATION_SPRAY | Freq: Two times a day (BID) | RESPIRATORY_TRACT | 11 refills | Status: DC

## 2023-07-01 MED ORDER — VENLAFAXINE HCL ER 37.5 MG PO CP24
37.5000 mg | ORAL_CAPSULE | Freq: Every day | ORAL | 1 refills | Status: DC
Start: 2023-07-01 — End: 2023-08-21

## 2023-07-01 MED ORDER — AMPHETAMINE-DEXTROAMPHET ER 20 MG PO CP24: 40.0000 mg | ORAL_CAPSULE | Freq: Every day | ORAL | 0 refills | Status: DC

## 2023-07-01 MED ORDER — DEXCOM G7 SENSOR MISC: 11 refills | Status: AC

## 2023-07-01 MED ORDER — FLUOXETINE HCL 40 MG PO CAPS: 40.0000 mg | ORAL_CAPSULE | Freq: Every day | ORAL | 1 refills | Status: DC

## 2023-07-01 MED ORDER — TIRZEPATIDE 7.5 MG/0.5ML ~~LOC~~ SOAJ
7.5000 mg | SUBCUTANEOUS | 0 refills | Status: DC
Start: 2023-07-01 — End: 2023-09-25

## 2023-07-01 MED ORDER — TIRZEPATIDE 10 MG/0.5ML ~~LOC~~ SOAJ: 10.0000 mg | SUBCUTANEOUS | 0 refills | Status: DC

## 2023-07-01 MED ORDER — ALBUTEROL SULFATE (2.5 MG/3ML) 0.083% IN NEBU: 2.5000 mg | INHALATION_SOLUTION | RESPIRATORY_TRACT | 6 refills | Status: AC | PRN

## 2023-07-01 MED ORDER — ONDANSETRON HCL 8 MG PO TABS: 8.0000 mg | ORAL_TABLET | Freq: Three times a day (TID) | ORAL | 2 refills | Status: AC | PRN

## 2023-07-01 MED ORDER — TIRZEPATIDE 5 MG/0.5ML ~~LOC~~ SOAJ: 5.0000 mg | SUBCUTANEOUS | 0 refills | Status: DC

## 2023-07-01 NOTE — Patient Instructions (Signed)
  X-rays were sent to [facility] at [address]. You can walk in to have these done.

## 2023-07-01 NOTE — Progress Notes (Signed)
 Dell Fennel, DNP, AGNP-c [facility] [address] [place], [place] [address] Main Office [phone]  BP 124/80   Pulse 76   Ht 5' 6 (1.676 m)   Wt 278 lb 12.8 oz (126.5 kg)   SpO2 96%   BMI 45.00 kg/m    Subjective:    Patient ID: Melissa Massey, female    DOB: 1975/06/07, 48 y.o.   MRN: [id number]  HPI: History of Present Illness Melissa Massey is a 48 year old female with rheumatoid arthritis who presents for her annual exam. She also would like to discuss neck pain and radiculopathy.  She experiences neck pain radiating from the base of her skull down her neck, predominantly on one side. Radiculopathy extends down her arm into her hand, accompanied by significant shoulder weakness. The pain radiates into her shoulder blade and down the back of her arm into her forearm and hand.  She is currently experiencing a rheumatoid arthritis flare, identified two weeks ago during a rheumatology visit. X-rays revealed fractures in both feet and her right wrist, including a nonunion fracture on the side of her foot and multiple broken toes. Her right foot has been swelling, prompting the x-rays. She is having difficulty tapering off prednisone  due to recurrent scleritis, and she is scheduled for a rituximab  infusion later this month. Her RA is progressing, affecting her breathing as she reduces prednisone , and she has developed painful RA nodules over the past year, which have increased in number recently.  She has been on prednisone , currently reduced to 10 mg from 20 mg. Rituximab  has been the most effective treatment for her eyes. Methotrexate is not an option due to liver issues. She is also taking Mounjaro , which she feels helps with inflammation, and she is on 2.5 mg currently. She is on venlafaxine  75 mg, which she plans to taper down, and she wishes to increase her Prozac  dosage as it is more effective for her. She takes Adderall 40 mg daily for fatigue  associated with her RA flare.  She reports significant fatigue, describing it as 'horrible' during this RA flare. She has experienced multiple falls, which she attributes to balance issues rather than pain from fractures. She notes dizziness when standing up too quickly and an unsteady gait, particularly when closing her eyes. Her balance issues began with the onset of her neck symptoms. No chest pain or heart palpitations but is concerned about her balance and gait.  She is scheduled for a pulmonary function test and a pulmonology appointment due to breathing difficulties associated with RA nodules. She has been unable to obtain Trelegy due to insurance issues and is considering Breztri  as an alternative. She uses a nebulizer with albuterol  and takes Celebrex  for pain management. She has discontinued rosuvastatin  due to gastrointestinal side effects and is exploring alternative lipid-lowering therapies.  Her daughter, a Archivist, is pregnant and also has RA, which requires her to come off her current medication. This situation adds to her stress, but she is motivated to improve her health to support her daughter.  Left foot and right foot fractures, right wrist fracture.   Pertinent items are noted in HPI.    Most Recent Depression Screen:     07/01/2023    8:30 AM 07/19/2022    7:37 PM 07/17/2022   11:06 AM 07/17/2022   10:56 AM 02/08/2022   10:17 PM  Depression screen PHQ 2/9  Decreased Interest 0    1  Down, Depressed, Hopeless 0  2  PHQ - 2 Score 0    3  Altered sleeping     3  Tired, decreased energy     3  Change in appetite     3  Feeling bad or failure about yourself      2  Trouble concentrating     0  Moving slowly or fidgety/restless     0  Suicidal thoughts     0  PHQ-9 Score     14  Difficult doing work/chores     Very difficult     Information is confidential and restricted. Go to Review Flowsheets to unlock data.   Most Recent Anxiety Screen:      No data to  display         Most Recent Fall Screen:    07/01/2023    8:30 AM 01/04/2023   10:33 AM 06/13/2021    1:29 PM 06/09/2021   10:02 AM 05/30/2021   11:06 AM  Fall Risk   Falls in the past year? 1 0 0 0 1  Number falls in past yr: 1 0 0 0 0  Injury with Fall? 1 0 0 0 1  Risk for fall due to : History of fall(s) No Fall Risks No Fall Risks Other (Comment)   Follow up Falls evaluation completed Falls evaluation completed Falls evaluation completed;Education provided  Falls evaluation completed;Education provided  Falls evaluation completed;Education provided      Data saved with a previous flowsheet row definition    Past medical history, surgical history, medications, allergies, family history and social history reviewed with patient today and changes made to appropriate areas of the chart.  Past Medical History:  Past Medical History:  Diagnosis Date   Absolute anemia 07/01/2023   Anxiety 08/21/2017   Arthritis    Asthma    CHF (congestive heart failure) (HCC)    Diabetes mellitus without complication (HCC)    Diverticulitis 06/09/2021   DVT (deep venous thrombosis) (HCC)    Graves disease    Grief reaction 02/02/2022   Hyperlipidemia    Hypertension    PCOS (polycystic ovarian syndrome)    Psychophysiological insomnia 02/02/2022   Rheumatoid arthritis (HCC)    Severe anxiety with panic 02/02/2022   Medications:  Current Outpatient Medications on File Prior to Visit  Medication Sig   albuterol  (VENTOLIN  HFA) 108 (90 Base) MCG/ACT inhaler Inhale 2 puffs into the lungs every 6 (six) hours as needed for wheezing or shortness of breath. Please fill as ventolin    celecoxib  (CELEBREX ) 200 MG capsule Take 1 capsule (200 mg total) by mouth 2 (two) times daily.   cyanocobalamin  (VITAMIN B12) 1000 MCG/ML injection Inject 1 mL (1,000 mcg total) into the muscle every 30 (thirty) days.   cyclobenzaprine (FLEXERIL) 5 MG tablet Take 5 mg by mouth at bedtime as needed for muscle spasms.    esomeprazole  (NEXIUM ) 40 MG capsule Take 1 capsule (40 mg total) by mouth 2 (two) times daily.   glipiZIDE  (GLUCOTROL ) 5 MG tablet Take 1 tablet (5 mg total) by mouth 2 (two) times daily before a meal.   hydrochlorothiazide  (HYDRODIURIL ) 25 MG tablet Take 1 tablet (25 mg total) by mouth daily.   insulin degludec  (TRESIBA  FLEXTOUCH) 100 UNIT/ML FlexTouch Pen INJECT 10 UNITS SUBCUTANEOUSLY AT BEDTIME. INCREASE BY 2 UNITS EVERY 4 DAYS FOR BLOOD SUGAR HIGHER THAN 150 FASTING IN MORNING. Max dose 60 units per dose.   Iron , Ferrous Sulfate , 325 (65 Fe) MG TABS Take 325 mg  by mouth daily.   levothyroxine  (SYNTHROID ) 125 MCG tablet Take 2 tablets (250 mcg total) by mouth daily.   liothyronine  (CYTOMEL ) 5 MCG tablet Take 1 tablet (5 mcg total) by mouth 2 (two) times daily.   losartan  (COZAAR ) 100 MG tablet Take 1 tablet (100 mg total) by mouth daily.   metFORMIN  (GLUCOPHAGE -XR) 500 MG 24 hr tablet TAKE 2 TABLETS BY MOUTH IN THE MORNING AND 2 TABLETS BY MOUTH AT BEDTIME   predniSONE  (DELTASONE ) 20 MG tablet Take by mouth.   riTUXimab -abbs (TRUXIMA ) 500 MG/50ML injection 1000 MG Intravenous DAY 0 & DAY 15 THEN REPEAT COURSE EVERY 24 WEEKS THEREAFTER   valACYclovir  (VALTREX ) 1000 MG tablet Take 2 tabs on day one and 1 tab a day on day 2,3,and 4 for outbreak of cold sore.   Vitamin D , Ergocalciferol , (DRISDOL ) 1.25 MG (50000 UNIT) CAPS capsule Take 1 capsule (50,000 Units total) by mouth every 7 (seven) days.   No current facility-administered medications on file prior to visit.   Surgical History:  Past Surgical History:  Procedure Laterality Date   CESAREAN SECTION     x2   LUMBAR FUSION     TUBAL LIGATION     Allergies:  Allergies  Allergen Reactions   Dextromethorphan-Guaifenesin Other (See Comments)   Family History:  Family History  Problem Relation Age of Onset   Heart attack Mother    Stroke Mother    Diabetes Mother    Hypertension Father    Colon polyps Father    Cancer - Other  Sister 1       neuroblastoma   Lung cancer Maternal Grandfather        d. 17s   Breast cancer Paternal Grandmother    Ovarian cancer Paternal Grandmother    Breast cancer Paternal Aunt        dx 5s   Uterine cancer Other        MGM's sister; dx before 72       Objective:    BP 124/80   Pulse 76   Ht 5' 6 (1.676 m)   Wt 278 lb 12.8 oz (126.5 kg)   SpO2 96%   BMI 45.00 kg/m   Wt Readings from Last 3 Encounters:  07/01/23 278 lb 12.8 oz (126.5 kg)  05/26/23 (!) 300 lb 4.3 oz (136.2 kg)  01/04/23 (!) 300 lb 3.2 oz (136.2 kg)    Physical Exam Vitals and nursing note reviewed.  Constitutional:      General: She is not in acute distress.    Appearance: She is obese. She is not ill-appearing.  HENT:     Head: Normocephalic and atraumatic.     Right Ear: Tympanic membrane normal.     Left Ear: Tympanic membrane normal.     Nose: Nose normal.     Mouth/Throat:     Mouth: Mucous membranes are moist.     Pharynx: Oropharynx is clear. No posterior oropharyngeal erythema.   Eyes:     Extraocular Movements: Extraocular movements intact.     Pupils: Pupils are equal, round, and reactive to light.   Neck:     Vascular: No carotid bruit.   Cardiovascular:     Rate and Rhythm: Normal rate and regular rhythm.     Pulses: Normal pulses.     Heart sounds: Normal heart sounds.  Pulmonary:     Effort: Pulmonary effort is normal.     Breath sounds: Wheezing present.  Abdominal:     General: Bowel  sounds are normal. There is no distension.     Palpations: Abdomen is soft.     Tenderness: There is no abdominal tenderness. There is no right CVA tenderness, left CVA tenderness or guarding.   Musculoskeletal:        General: Swelling, tenderness and signs of injury present.     Cervical back: Neck supple. Tenderness present.     Right lower leg: No edema.     Left lower leg: No edema.  Lymphadenopathy:     Cervical: No cervical adenopathy.   Skin:    General: Skin is warm and  dry.     Capillary Refill: Capillary refill takes less than 2 seconds.   Neurological:     Mental Status: She is alert and oriented to person, place, and time.     Cranial Nerves: Cranial nerves 2-12 are intact.     Sensory: No sensory deficit.     Motor: Weakness present.     Coordination: Impaired rapid alternating movements.     Gait: Gait abnormal.     Deep Tendon Reflexes: Reflexes are normal and symmetric.   Psychiatric:        Attention and Perception: Attention normal.        Mood and Affect: Mood normal.        Speech: Speech normal.        Behavior: Behavior normal. Behavior is cooperative.        Thought Content: Thought content normal.        Cognition and Memory: Cognition normal.        Judgment: Judgment normal.    Results for orders placed or performed in visit on 07/01/23  Vitamin B12   Collection Time: 07/01/23  9:31 AM  Result Value Ref Range   Vitamin B-12 284 232 - 1,245 pg/mL  TSH   Collection Time: 07/01/23  9:31 AM  Result Value Ref Range   TSH 12.800 (H) 0.450 - 4.500 uIU/mL  VITAMIN D  25 Hydroxy (Vit-D Deficiency, Fractures)   Collection Time: 07/01/23  9:31 AM  Result Value Ref Range   Vit D, 25-Hydroxy 37.2 30.0 - 100.0 ng/mL  T4, free   Collection Time: 07/01/23  9:31 AM  Result Value Ref Range   Free T4 0.74 (L) 0.82 - 1.77 ng/dL  Lipid panel   Collection Time: 07/01/23  9:31 AM  Result Value Ref Range   Cholesterol, Total 204 (H) 100 - 199 mg/dL   Triglycerides 191 (H) 0 - 149 mg/dL   HDL 56 >47 mg/dL   VLDL Cholesterol Cal 27 5 - 40 mg/dL   LDL Chol Calc (NIH) 829 (H) 0 - 99 mg/dL   Chol/HDL Ratio 3.6 0.0 - 4.4 ratio       Assessment & Plan:   Problem List Items Addressed This Visit     Asthma in adult, moderate persistent, with status asthmaticus   Some wheezing, recommend triple therapy for management. Samples provided of Breztri .       Relevant Medications   albuterol  (PROVENTIL ) (2.5 MG/3ML) 0.083% nebulizer solution    Attention deficit hyperactivity disorder (ADHD), predominantly inattentive type   Chronic. Well controlled on adderall. Continue 40mg  daily.       Relevant Medications   amphetamine -dextroamphetamine (ADDERALL XR) 20 MG 24 hr capsule   Hypertension associated with diabetes (HCC)   BP well controlled. No alarm symptoms present. Continue to monitor.       Relevant Medications   tirzepatide  (MOUNJARO ) 5 MG/0.5ML Pen  tirzepatide  (MOUNJARO ) 7.5 MG/0.5ML Pen   tirzepatide  (MOUNJARO ) 10 MG/0.5ML Pen   Rheumatoid arthritis involving multiple sites with positive rheumatoid factor (HCC)   Active RA flare with multiple fractures in both feet and right wrist, likely due to long-term prednisone  use. 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 Rituximab  has been effective in the past, and another infusion is planned. Methotrexate is contraindicated due to liver issues. Significant fatigue and difficulty managing blood sugars due to prednisone . Mounjaro  noted to help with inflammation. If rituximab  is ineffective, Remicade will be considered. She is motivated to improve health due to daughter's pregnancy and her own RA management. - Administer rituximab  infusion on June 25th. - Continue tapering prednisone  with the goal of discontinuation. - Continue Mounjaro  at 2.5 mg, with potential increase to 5 mg if tolerated. - Prescribe Celebrex  for pain management. - Refer to neurology for further evaluation of weakness and balance issues. - Order B12 and vitamin D  levels.       Severe obesity (BMI >= 40) (HCC)   Currently unable to exercise due to severe pain and debilitation from RA and new symptoms of weakness. Will taper Mounjaro  up to max dose. Recommend strict dietary control and start exercise as soon as cleared with neuro and rheum.       Relevant Medications   amphetamine -dextroamphetamine (ADDERALL XR) 20 MG 24 hr capsule   tirzepatide  (MOUNJARO ) 5 MG/0.5ML Pen   tirzepatide  (MOUNJARO ) 7.5  MG/0.5ML Pen   tirzepatide  (MOUNJARO ) 10 MG/0.5ML Pen   Type 2 diabetes mellitus with hyperglycemia, without long-term current use of insulin (HCC)   Continues on prednisone , but working on tapering which should help with BG control. Labs pending. Increase Mounjaro  and work to taper to max dose as tolerated monthly.        Relevant Medications   tirzepatide  (MOUNJARO ) 5 MG/0.5ML Pen   tirzepatide  (MOUNJARO ) 7.5 MG/0.5ML Pen   tirzepatide  (MOUNJARO ) 10 MG/0.5ML Pen   Continuous Glucose Sensor (DEXCOM G7 SENSOR) MISC   Other Relevant Orders   Lipid panel (Completed)   Vitamin D  deficiency   Chronic with decreased absorption. Chronically on replacement. Will monitor labs today.       Relevant Orders   VITAMIN D  25 Hydroxy (Vit-D Deficiency, Fractures) (Completed)   Vitamin B 12 deficiency   Chronic. Repeat labs      Relevant Orders   Vitamin B12 (Completed)   Chronic congestive heart failure (HCC)   No alarm symptoms present at this time. Continue current medication and monitoring.       Depression, recurrent (HCC)   On venlafaxine  for two years, wishes to taper off due to side effects. Anxiety improved, but depression persists. Prozac  effective in the past, especially for PMDD. Plan to taper venlafaxine  and increase Prozac  dosage. - Taper venlafaxine  to 37.5 mg for two weeks, then consider discontinuation. - Increase Prozac  to 40 mg daily.      Relevant Medications   FLUoxetine  (PROZAC ) 40 MG capsule   venlafaxine  XR (EFFEXOR  XR) 37.5 MG 24 hr capsule   Iron  deficiency   Repeat labs      Multiple pulmonary nodules   Reports changes in breathing with prednisone  taper. Scheduled for pulmonary function test and pulmonology follow-up. Trelegy not approved by Medicaid; Breztri  is an alternative. Advised to inform pulmonology of chronic prednisone  use. - Hold off on starting Breztri  until after pulmonary function test. - Ensure pulmonology is aware of chronic prednisone  use. -  Prescribe albuterol  for nebulizer.      Cervical  radiculopathy, acute - Primary   Neck pain at the base of the skull radiating down the arm with associated weakness and radiculopathy. Symptoms suggest possible cervical radiculopathy or pinched nerve. Differential includes stress-related muscle strain or cervical spine degeneration. Concerns about potential RA-related cervical spine issues, such as C1-C2 spondylopathy. - Order cervical and thoracic spine x-rays. - Refer to neurology for further evaluation.      Relevant Medications   FLUoxetine  (PROZAC ) 40 MG capsule   venlafaxine  XR (EFFEXOR  XR) 37.5 MG 24 hr capsule   amphetamine -dextroamphetamine (ADDERALL XR) 20 MG 24 hr capsule   Other Relevant Orders   DG Cervical Spine Complete (Completed)   Acute bilateral thoracic back pain   Relevant Orders   DG Thoracic Spine 2 View (Completed)   Left-sided weakness   Weakness of unknown origin with frequent falls in addition to radicular neck and upper back pain. Recommend nuero evaluation for weakness.       Relevant Orders   Ambulatory referral to Neurology   Hyperlipidemia associated with type 2 diabetes mellitus (HCC)   Unable to tolerate rosuvastatin  due to gastrointestinal side effects. Interested in [place], but insurance approval is uncertain. - Attempt to obtain insurance approval for Repatha.      Relevant Medications   tirzepatide  (MOUNJARO ) 5 MG/0.5ML Pen   tirzepatide  (MOUNJARO ) 7.5 MG/0.5ML Pen   tirzepatide  (MOUNJARO ) 10 MG/0.5ML Pen   Obstructive sleep apnea syndrome, severe   RESOLVED: Diverticulitis   Relevant Medications   ondansetron  (ZOFRAN ) 8 MG tablet   Fatty liver   Relevant Orders   Lipid panel (Completed)   Graves disease   Relevant Orders   TSH (Completed)   T4, free (Completed)   Other Visit Diagnoses       Hypersomnia       Relevant Medications   FLUoxetine  (PROZAC ) 40 MG capsule   venlafaxine  XR (EFFEXOR  XR) 37.5 MG 24 hr capsule    amphetamine -dextroamphetamine (ADDERALL XR) 20 MG 24 hr capsule     Type 2 diabetes mellitus with other ophthalmic complication, with long-term current use of insulin (HCC)       Relevant Medications   tirzepatide  (MOUNJARO ) 5 MG/0.5ML Pen   tirzepatide  (MOUNJARO ) 7.5 MG/0.5ML Pen   tirzepatide  (MOUNJARO ) 10 MG/0.5ML Pen   Continuous Glucose Sensor (DEXCOM G7 SENSOR) MISC     Frequent falls       Relevant Orders   Ambulatory referral to Neurology         Follow up plan: Return in about 6 months (around 12/31/2023) for Med Management 44.  NEXT PREVENTATIVE PHYSICAL DUE IN 1 YEAR.  PATIENT COUNSELING PROVIDED FOR ALL ADULT PATIENTS: A well balanced diet low in saturated fats, cholesterol, and moderation in carbohydrates.  This can be as simple as monitoring portion sizes and cutting back on sugary beverages such as soda and juice to start with.    Daily water consumption of at least 64 ounces.  Physical activity at least 180 minutes per week.  If just starting out, start 10 minutes a day and work your way up.   This can be as simple as taking the stairs instead of the elevator and walking 2-3 laps around the office  purposefully every day.   STD protection, partner selection, and regular testing if high risk.  Limited consumption of alcoholic beverages if alcohol is consumed. For men, I recommend no more than 14 alcoholic beverages per week, spread out throughout the week (max 2 per day). Avoid binge drinking or consuming large  quantities of alcohol in one setting.  Please let me know if you feel you may need help with reduction or quitting alcohol consumption.   Avoidance of nicotine, if used. Please let me know if you feel you may need help with reduction or quitting nicotine use.   Daily mental health attention. This can be in the form of 5 minute daily meditation, prayer, journaling, yoga, reflection, etc.  Purposeful attention to your emotions and mental state can  significantly improve your overall wellbeing  and  Health.  Please know that I am here to help you with all of your health care goals and am happy to work with you to find a solution that works best for you.  The greatest advice I have received with any changes in life are to take it one step at a time, that even means if all you can focus on is the next 60 seconds, then do that and celebrate your victories.  With any changes in life, you will have set backs, and that is OK. The important thing to remember is, if you have a set back, it is not a failure, it is an opportunity to try again! Screening Testing Mammogram Every 1 -2 years based on history and risk factors Starting at age 66 Pap Smear Ages 21-39 every 3 years Ages 57-65 every 5 years with HPV testing More frequent testing may be required based on results and history Colon Cancer Screening Every 1-10 years based on test performed, risk factors, and history Starting at age 65 Bone Density Screening Every 2-10 years based on history Starting at age 67 for women Recommendations for men differ based on medication usage, history, and risk factors AAA Screening One time ultrasound Men 18-74 years old who have every smoked Lung Cancer Screening Low Dose Lung CT every 12 months Age 35-80 years with a 30 pack-year smoking history who still smoke or who have quit within the last 15 years   Screening Labs Routine  Labs: Complete Blood Count (CBC), Complete Metabolic Panel (CMP), Cholesterol (Lipid Panel) Every 6-12 months based on history and medications May be recommended more frequently based on current conditions or previous results Hemoglobin A1c Lab Every 3-12 months based on history and previous results Starting at age 55 or earlier with diagnosis of diabetes, high cholesterol, BMI >26, and/or risk factors Frequent monitoring for patients with diabetes to ensure blood sugar control Thyroid Panel (TSH) Every 6 months based on  history, symptoms, and risk factors May be repeated more often if on medication HIV One time testing for all patients 58 and older May be repeated more frequently for patients with increased risk factors or exposure Hepatitis C One time testing for all patients 67 and older May be repeated more frequently for patients with increased risk factors or exposure Gonorrhea, Chlamydia Every 12 months for all sexually active persons 13-24 years Additional monitoring may be recommended for those who are considered high risk or who have symptoms Every 12 months for any woman on birth control, regardless of sexual activity PSA Men 49-28 years old with risk factors Additional screening may be recommended from age 48-69 based on risk factors, symptoms, and history  Vaccine Recommendations Tetanus Booster All adults every 10 years Flu Vaccine All patients 6 months and older every year COVID Vaccine All patients 12 years and older Initial dosing with booster May recommend additional booster based on age and health history HPV Vaccine 2 doses all patients age 19-26 Dosing may be  considered for patients over 26 Shingles Vaccine (Shingrix) 2 doses all adults 55 years and older Pneumonia (Pneumovax 23) All adults 65 years and older May recommend earlier dosing based on health history One year apart from Prevnar 13 Pneumonia (Prevnar 33) All adults 65 years and older Dosed 1 year after Pneumovax 23 Pneumonia (Prevnar 20) One time alternative to the two dosing of 13 and 23 For all adults with initial dose of 23, 20 is recommended 1 year later For all adults with initial dose of 13, 23 is still recommended as second option 1 year later

## 2023-07-02 ENCOUNTER — Other Ambulatory Visit (HOSPITAL_COMMUNITY): Payer: Self-pay

## 2023-07-02 ENCOUNTER — Telehealth: Payer: Self-pay

## 2023-07-02 DIAGNOSIS — J4542 Moderate persistent asthma with status asthmaticus: Secondary | ICD-10-CM

## 2023-07-02 LAB — LIPID PANEL
Chol/HDL Ratio: 3.6 ratio (ref 0.0–4.4)
Cholesterol, Total: 204 mg/dL — ABNORMAL HIGH (ref 100–199)
HDL: 56 mg/dL (ref >39)
LDL Chol Calc (NIH): 121 mg/dL — ABNORMAL HIGH (ref 0–99)
Triglycerides: 151 mg/dL — ABNORMAL HIGH (ref 0–149)
VLDL Cholesterol Cal: 27 mg/dL (ref 5–40)

## 2023-07-02 LAB — T4, FREE: Free T4: 0.74 ng/dL — ABNORMAL LOW (ref 0.82–1.77)

## 2023-07-02 LAB — VITAMIN B12: Vitamin B-12: 284 pg/mL (ref 232–1245)

## 2023-07-02 LAB — VITAMIN D 25 HYDROXY (VIT D DEFICIENCY, FRACTURES): Vit D, 25-Hydroxy: 37.2 ng/mL (ref 30.0–100.0)

## 2023-07-02 LAB — TSH: TSH: 12.8 u[IU]/mL — ABNORMAL HIGH (ref 0.450–4.500)

## 2023-07-02 NOTE — Telephone Encounter (Signed)
 Pharmacy Patient Advocate Encounter   Received notification from CoverMyMeds that prior authorization for Dexcom G7 Sensor is required/requested.   Insurance verification completed.   The patient is insured through [facility] .   Per test claim: PA required; PA submitted to above mentioned insurance via CoverMyMeds Key/confirmation #/EOC (Key: [id number])     Status is pending

## 2023-07-02 NOTE — Telephone Encounter (Signed)
 Pharmacy Patient Advocate Encounter   Received notification from CoverMyMeds that prior authorization for Breztri Aerosphere 160-9-4.8MCG/ACT aerosol is required/requested.   Insurance verification completed.   The patient is insured through [facility] .   Per test claim: PA required; PA submitted to above mentioned insurance via CoverMyMeds Key/confirmation #/EOC (Key: [id number])     Status is pending

## 2023-07-03 ENCOUNTER — Ambulatory Visit: Payer: Self-pay | Admitting: Nurse Practitioner

## 2023-07-03 ENCOUNTER — Other Ambulatory Visit (HOSPITAL_COMMUNITY): Payer: Self-pay

## 2023-07-03 ENCOUNTER — Other Ambulatory Visit: Payer: Self-pay

## 2023-07-03 MED ORDER — ATORVASTATIN CALCIUM 10 MG PO TABS: 10.0000 mg | ORAL_TABLET | Freq: Every day | ORAL | 0 refills | Status: DC

## 2023-07-03 MED ORDER — VITAMIN D (ERGOCALCIFEROL) 1.25 MG (50000 UNIT) PO CAPS: 50000.0000 [IU] | ORAL_CAPSULE | ORAL | 1 refills | Status: DC

## 2023-07-03 NOTE — Telephone Encounter (Signed)
 Pharmacy Patient Advocate Encounter  Received notification from [facility] that Prior Authorization for Dexcom G7 Sensor has been APPROVED from 6.10.25 to 12.10.25. Ran test claim, Copay is $0.00. This test claim was processed through [facility]- copay amounts may vary at other pharmacies due to pharmacy/plan contracts, or as the patient moves through the different stages of their insurance plan.   PA #/Case ID/Reference #: (Key: [id number])

## 2023-07-05 ENCOUNTER — Other Ambulatory Visit: Payer: Self-pay

## 2023-07-05 DIAGNOSIS — M47812 Spondylosis without myelopathy or radiculopathy, cervical region: Secondary | ICD-10-CM

## 2023-07-05 DIAGNOSIS — M546 Pain in thoracic spine: Secondary | ICD-10-CM

## 2023-07-05 DIAGNOSIS — M5412 Radiculopathy, cervical region: Secondary | ICD-10-CM

## 2023-07-08 NOTE — Telephone Encounter (Signed)
 Pharmacy Patient Advocate Encounter  Received notification from [facility] that Prior Authorization for  Breztri  Aerosphere 160-9-4.8MCG/ACT aerosol has been DENIED.  Full denial letter will be uploaded to the media tab. See denial reason below.  Pt plan states'' the pt must trial and fail one of the 3 pref'd drugs first

## 2023-07-09 ENCOUNTER — Encounter: Payer: Self-pay | Admitting: Nurse Practitioner

## 2023-07-09 DIAGNOSIS — M546 Pain in thoracic spine: Secondary | ICD-10-CM | POA: Insufficient documentation

## 2023-07-09 DIAGNOSIS — E1169 Type 2 diabetes mellitus with other specified complication: Secondary | ICD-10-CM | POA: Insufficient documentation

## 2023-07-09 DIAGNOSIS — R531 Weakness: Secondary | ICD-10-CM | POA: Insufficient documentation

## 2023-07-09 DIAGNOSIS — M5412 Radiculopathy, cervical region: Secondary | ICD-10-CM | POA: Insufficient documentation

## 2023-07-09 MED ORDER — FLUTICASONE-SALMETEROL 100-50 MCG/ACT IN AEPB: 1.0000 | INHALATION_SPRAY | Freq: Two times a day (BID) | RESPIRATORY_TRACT | 3 refills | Status: AC

## 2023-07-09 NOTE — Assessment & Plan Note (Signed)
 Continues on prednisone , but working on tapering which should help with BG control. Labs pending. Increase Mounjaro  and work to taper to max dose as tolerated monthly.

## 2023-07-09 NOTE — Assessment & Plan Note (Signed)
 Unable to tolerate rosuvastatin  due to gastrointestinal side effects. Interested in [place], but insurance approval is uncertain. - Attempt to obtain insurance approval for Repatha.

## 2023-07-09 NOTE — Assessment & Plan Note (Signed)
 Weakness of unknown origin with frequent falls in addition to radicular neck and upper back pain. Recommend nuero evaluation for weakness.

## 2023-07-09 NOTE — Assessment & Plan Note (Signed)
 Neck pain at the base of the skull radiating down the arm with associated weakness and radiculopathy. Symptoms suggest possible cervical radiculopathy or pinched nerve. Differential includes stress-related muscle strain or cervical spine degeneration. Concerns about potential RA-related cervical spine issues, such as C1-C2 spondylopathy. - Order cervical and thoracic spine x-rays. - Refer to neurology for further evaluation.

## 2023-07-09 NOTE — Assessment & Plan Note (Signed)
 No alarm symptoms present at this time. Continue current medication and monitoring.

## 2023-07-09 NOTE — Assessment & Plan Note (Signed)
Chronic with decreased absorption. Chronically on replacement. Will monitor labs today.  ?

## 2023-07-09 NOTE — Telephone Encounter (Signed)
 Please let her know that her insurance would not cover Breztri  until she tries and fails Advair. I have sent this in for her

## 2023-07-09 NOTE — Assessment & Plan Note (Signed)
 Some wheezing, recommend triple therapy for management. Samples provided of Breztri .

## 2023-07-09 NOTE — Assessment & Plan Note (Signed)
 Reports changes in breathing with prednisone  taper. Scheduled for pulmonary function test and pulmonology follow-up. Trelegy not approved by Medicaid; Breztri  is an alternative. Advised to inform pulmonology of chronic prednisone  use. - Hold off on starting Breztri  until after pulmonary function test. - Ensure pulmonology is aware of chronic prednisone  use. - Prescribe albuterol  for nebulizer.

## 2023-07-09 NOTE — Assessment & Plan Note (Signed)
 Chronic. Well controlled on adderall. Continue 40mg  daily.

## 2023-07-09 NOTE — Assessment & Plan Note (Signed)
 BP well controlled. No alarm symptoms present. Continue to monitor.

## 2023-07-09 NOTE — Addendum Note (Signed)
 Addended by: Khaleesi Gruel, Abraham Hoffmann E on: 07/09/2023 06:35 PM   Modules accepted: Orders

## 2023-07-09 NOTE — Assessment & Plan Note (Signed)
 Active RA flare with multiple fractures in both feet and right wrist, likely due to long-term prednisone  use. 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 Rituximab  has been effective in the past, and another infusion is planned. Methotrexate is contraindicated due to liver issues. Significant fatigue and difficulty managing blood sugars due to prednisone . Mounjaro  noted to help with inflammation. If rituximab  is ineffective, Remicade will be considered. She is motivated to improve health due to daughter's pregnancy and her own RA management. - Administer rituximab  infusion on June 25th. - Continue tapering prednisone  with the goal of discontinuation. - Continue Mounjaro  at 2.5 mg, with potential increase to 5 mg if tolerated. - Prescribe Celebrex  for pain management. - Refer to neurology for further evaluation of weakness and balance issues. - Order B12 and vitamin D  levels.

## 2023-07-09 NOTE — Assessment & Plan Note (Signed)
 Currently unable to exercise due to severe pain and debilitation from RA and new symptoms of weakness. Will taper Mounjaro  up to max dose. Recommend strict dietary control and start exercise as soon as cleared with neuro and rheum.

## 2023-07-09 NOTE — Assessment & Plan Note (Signed)
Chronic. Repeat labs.

## 2023-07-09 NOTE — Assessment & Plan Note (Signed)
 Repeat labs:

## 2023-07-09 NOTE — Assessment & Plan Note (Signed)
 On venlafaxine  for two years, wishes to taper off due to side effects. Anxiety improved, but depression persists. Prozac  effective in the past, especially for PMDD. Plan to taper venlafaxine  and increase Prozac  dosage. - Taper venlafaxine  to 37.5 mg for two weeks, then consider discontinuation. - Increase Prozac  to 40 mg daily.

## 2023-07-10 ENCOUNTER — Other Ambulatory Visit (HOSPITAL_COMMUNITY): Payer: Self-pay

## 2023-07-10 ENCOUNTER — Telehealth: Payer: Self-pay

## 2023-07-10 NOTE — Telephone Encounter (Signed)
 Pharmacy Patient Advocate Encounter   Received notification from CoverMyMeds that prior authorization for Advair Diskus 100-50MCG/ACT aerosol powder is required/requested.   Insurance verification completed.   The patient is insured through [facility] .   Per test claim: PA required; PA submitted to above mentioned insurance via CoverMyMeds Key/confirmation #/EOC (Key: [id number])    Status is pending

## 2023-07-12 NOTE — Telephone Encounter (Signed)
 6/20 Update:  Spoke with pts pharmacy and was directed to pharmacist on duty and was made aware that the Rx is for Advair is not going through, even when she has switched ndc's and tried other avenues, I informed the pharmacist to contact the number that was given to me during the P/a submission Process.

## 2023-07-22 ENCOUNTER — Other Ambulatory Visit (HOSPITAL_COMMUNITY): Payer: Self-pay

## 2023-07-22 NOTE — Telephone Encounter (Signed)
 Called pharmacy to verify that advair was successfully picked up and it was, as of 06/23. Nothing further needed.

## 2023-07-23 ENCOUNTER — Telehealth: Payer: Self-pay | Admitting: Neurology

## 2023-07-23 ENCOUNTER — Other Ambulatory Visit (HOSPITAL_COMMUNITY): Payer: Self-pay

## 2023-07-23 NOTE — Telephone Encounter (Signed)
 LVM and sent mychart msg informing pt of need to reschedule 11/12/23 appt - MD out

## 2023-07-24 ENCOUNTER — Other Ambulatory Visit (HOSPITAL_BASED_OUTPATIENT_CLINIC_OR_DEPARTMENT_OTHER): Payer: Self-pay | Admitting: Nurse Practitioner

## 2023-07-24 DIAGNOSIS — J4542 Moderate persistent asthma with status asthmaticus: Secondary | ICD-10-CM

## 2023-07-30 DIAGNOSIS — S92301G Fracture of unspecified metatarsal bone(s), right foot, subsequent encounter for fracture with delayed healing: Secondary | ICD-10-CM | POA: Insufficient documentation

## 2023-07-30 DIAGNOSIS — Q66221 Congenital metatarsus adductus, right foot: Secondary | ICD-10-CM | POA: Insufficient documentation

## 2023-07-30 DIAGNOSIS — M7741 Metatarsalgia, right foot: Secondary | ICD-10-CM | POA: Insufficient documentation

## 2023-08-12 ENCOUNTER — Encounter: Payer: Self-pay | Admitting: Nurse Practitioner

## 2023-08-13 ENCOUNTER — Other Ambulatory Visit: Payer: Self-pay | Admitting: Nurse Practitioner

## 2023-08-13 ENCOUNTER — Other Ambulatory Visit (HOSPITAL_COMMUNITY): Payer: Self-pay

## 2023-08-13 ENCOUNTER — Telehealth: Payer: Self-pay

## 2023-08-13 ENCOUNTER — Encounter: Payer: Self-pay | Admitting: Nurse Practitioner

## 2023-08-13 DIAGNOSIS — G4710 Hypersomnia, unspecified: Secondary | ICD-10-CM

## 2023-08-13 DIAGNOSIS — F900 Attention-deficit hyperactivity disorder, predominantly inattentive type: Secondary | ICD-10-CM

## 2023-08-13 MED ORDER — AMPHETAMINE-DEXTROAMPHET ER 20 MG PO CP24: 40.0000 mg | ORAL_CAPSULE | Freq: Every day | ORAL | 0 refills | Status: DC

## 2023-08-13 NOTE — Telephone Encounter (Signed)
 Pharmacy Patient Advocate Encounter   Received notification from [facility] that prior authorization for Amphetamine -Dextroamphet ER 20MG  er capsules is required/requested.   Insurance verification completed.   The patient is insured through [facility] .   Per test claim: PA required; PA submitted to above mentioned insurance via CoverMyMeds Key/confirmation #/EOC (Key: [id number]) Status is pending

## 2023-08-14 ENCOUNTER — Other Ambulatory Visit (HOSPITAL_COMMUNITY): Payer: Self-pay

## 2023-08-14 ENCOUNTER — Other Ambulatory Visit: Payer: Self-pay

## 2023-08-14 ENCOUNTER — Telehealth: Payer: Self-pay | Admitting: Nurse Practitioner

## 2023-08-14 ENCOUNTER — Other Ambulatory Visit (HOSPITAL_BASED_OUTPATIENT_CLINIC_OR_DEPARTMENT_OTHER): Payer: Self-pay | Admitting: Nurse Practitioner

## 2023-08-14 DIAGNOSIS — J4542 Moderate persistent asthma with status asthmaticus: Secondary | ICD-10-CM

## 2023-08-14 DIAGNOSIS — E063 Autoimmune thyroiditis: Secondary | ICD-10-CM

## 2023-08-14 NOTE — Telephone Encounter (Signed)
 Need lab orders placed for  7/28 lab appt ( thyroid)

## 2023-08-14 NOTE — Telephone Encounter (Signed)
 Pharmacy Patient Advocate Encounter   Please see previous patient msg from 7/21 as the patient inquired about her Rx.  Received notification from [facility] that Prior Authorization for Amphetamine -Dextroamphet ER 20MG  er capsules has been APPROVED from 7.23.25 to 7.23.26. Ran test claim, Copay is $4.00. This test claim was processed through [facility]- copay amounts may vary at other pharmacies due to pharmacy/plan contracts, or as the patient moves through the different stages of their insurance plan.   PA #/Case ID/Reference #: (Key: [id number])

## 2023-08-19 ENCOUNTER — Other Ambulatory Visit

## 2023-08-21 ENCOUNTER — Other Ambulatory Visit: Payer: Self-pay | Admitting: Nurse Practitioner

## 2023-08-21 DIAGNOSIS — G4710 Hypersomnia, unspecified: Secondary | ICD-10-CM

## 2023-08-21 DIAGNOSIS — F339 Major depressive disorder, recurrent, unspecified: Secondary | ICD-10-CM

## 2023-09-20 ENCOUNTER — Encounter: Payer: Self-pay | Admitting: Nurse Practitioner

## 2023-09-20 DIAGNOSIS — G4710 Hypersomnia, unspecified: Secondary | ICD-10-CM

## 2023-09-20 DIAGNOSIS — F900 Attention-deficit hyperactivity disorder, predominantly inattentive type: Secondary | ICD-10-CM

## 2023-09-20 MED ORDER — AMPHETAMINE-DEXTROAMPHET ER 20 MG PO CP24: 40.0000 mg | ORAL_CAPSULE | Freq: Every day | ORAL | 0 refills | Status: DC

## 2023-09-20 NOTE — Telephone Encounter (Signed)
 Three months of prescriptions were sent 08/13/2023.  There should be two prescriptions at [facility] in [place] for Adderall. One was able to be picked up on 08/19 and the other is available 09/16.   Please ask her to call the pharmacy to have it filled.

## 2023-09-22 ENCOUNTER — Other Ambulatory Visit: Payer: Self-pay | Admitting: Nurse Practitioner

## 2023-09-22 ENCOUNTER — Encounter: Payer: Self-pay | Admitting: Emergency Medicine

## 2023-09-22 ENCOUNTER — Ambulatory Visit: Admission: EM | Admit: 2023-09-22 | Discharge: 2023-09-22 | Disposition: A | Discharge status: Home / Self Care

## 2023-09-22 DIAGNOSIS — R21 Rash and other nonspecific skin eruption: Secondary | ICD-10-CM

## 2023-09-22 DIAGNOSIS — D509 Iron deficiency anemia, unspecified: Secondary | ICD-10-CM | POA: Insufficient documentation

## 2023-09-22 DIAGNOSIS — E1165 Type 2 diabetes mellitus with hyperglycemia: Secondary | ICD-10-CM

## 2023-09-22 MED ORDER — TRIAMCINOLONE ACETONIDE 0.5 % EX OINT: 1.0000 | TOPICAL_OINTMENT | Freq: Two times a day (BID) | CUTANEOUS | 0 refills | Status: AC

## 2023-09-22 MED ORDER — DEXAMETHASONE SODIUM PHOSPHATE 10 MG/ML IJ SOLN
10.0000 mg | Freq: Once | INTRAMUSCULAR | Status: AC
  Administered 2023-09-22: 10 mg via INTRAMUSCULAR

## 2023-09-22 NOTE — ED Provider Notes (Signed)
 [facility]  Note:  This document was prepared using Conservation officer, historic buildings and may include unintentional dictation errors.  MRN: [id number] DOB: 1975/08/09  Subjective:   Melissa Massey is a 48 y.o. female presenting for rash to the left arm that started yesterday morning.  Patient reports that initially rash was just on her lower forearm but is now spread up to her elbow and is now beginning on her right arm as well.  Patient denies any respiratory distress, wheezing, stridor, swelling to the lips, tongue, face, difficulty swallowing.  Patient reports that rash is pruritic and slightly uncomfortable with palpation.  Patient has been taking prednisone  for several years for rheumatoid arthritis states she took 10 mg today and will take 7.5 mg tomorrow.  Patient has been using topical Benadryl cream and hydrocortisone with minimal improvement.  No known exposure to any environmental allergens or change in household products.  No current facility-administered medications for this encounter.  Current Outpatient Medications:    albuterol  (PROVENTIL ) (2.5 MG/3ML) 0.083% nebulizer solution, Take 3 mLs (2.5 mg total) by nebulization every 4 (four) hours as needed for wheezing or shortness of breath (please include nebulizer machine, hoses, and mask if needed.)., Disp: 185 mL, Rfl: 6   amphetamine -dextroamphetamine (ADDERALL XR) 20 MG 24 hr capsule, Take 2 capsules (40 mg total) by mouth daily., Disp: 60 capsule, Rfl: 0   atorvastatin  (LIPITOR) 10 MG tablet, Take 1 tablet (10 mg total) by mouth daily., Disp: 90 tablet, Rfl: 0   celecoxib  (CELEBREX ) 200 MG capsule, Take 1 capsule (200 mg total) by mouth 2 (two) times daily., Disp: 180 capsule, Rfl: 3   Continuous Glucose Sensor (DEXCOM G7 SENSOR) MISC, Apply one device every 10 days as directed for continuous glucose measurement., Disp: 3 each, Rfl: 11   esomeprazole  (NEXIUM ) 40 MG capsule, Take 1 capsule (40 mg total) by mouth 2  (two) times daily., Disp: 60 capsule, Rfl: 5   FLUoxetine  (PROZAC ) 40 MG capsule, Take 1 capsule (40 mg total) by mouth daily., Disp: 90 capsule, Rfl: 1   fluticasone -salmeterol (ADVAIR) 100-50 MCG/ACT AEPB, Inhale 1 puff into the lungs 2 (two) times daily., Disp: 1 each, Rfl: 3   Fluticasone -Umeclidin-Vilant (TRELEGY ELLIPTA ) 200-62.5-25 MCG/ACT AEPB, Inhalation; Duration: 30 Days, Disp: , Rfl:    glipiZIDE  (GLUCOTROL ) 5 MG tablet, Take 1 tablet (5 mg total) by mouth 2 (two) times daily before a meal., Disp: 180 tablet, Rfl: 3   hydrochlorothiazide  (HYDRODIURIL ) 25 MG tablet, Take 1 tablet (25 mg total) by mouth daily., Disp: 90 tablet, Rfl: 3   hydroxychloroquine (PLAQUENIL) 200 MG tablet, Take 400 mg by mouth daily., Disp: , Rfl:    hydroxychloroquine (PLAQUENIL) 200 MG tablet, Take 400 mg by mouth daily., Disp: , Rfl:    hydroxychloroquine (PLAQUENIL) 200 MG tablet, Take 200 mg by mouth 2 (two) times daily., Disp: , Rfl:    Iron , Ferrous Sulfate , 325 (65 Fe) MG TABS, Take 325 mg by mouth daily., Disp: 90 tablet, Rfl: 1   levothyroxine  (SYNTHROID ) 125 MCG tablet, Take 2 tablets (250 mcg total) by mouth daily., Disp: 180 tablet, Rfl: 1   liothyronine  (CYTOMEL ) 5 MCG tablet, Take 1 tablet (5 mcg total) by mouth 2 (two) times daily., Disp: 180 tablet, Rfl: 1   losartan  (COZAAR ) 100 MG tablet, Take 1 tablet (100 mg total) by mouth daily., Disp: 90 tablet, Rfl: 3   metFORMIN  (GLUCOPHAGE -XR) 500 MG 24 hr tablet, TAKE 2 TABLETS BY MOUTH IN THE MORNING AND 2  TABLETS BY MOUTH AT BEDTIME, Disp: 360 tablet, Rfl: 3   ondansetron  (ZOFRAN ) 8 MG tablet, Take 1 tablet (8 mg total) by mouth every 8 (eight) hours as needed for nausea or vomiting., Disp: 20 tablet, Rfl: 2   predniSONE  (DELTASONE ) 10 MG tablet, Take 10 mg by mouth daily with breakfast., Disp: , Rfl:    tirzepatide  (MOUNJARO ) 7.5 MG/0.5ML Pen, Inject 7.5 mg into the skin once a week. Start after completing the 5mg  dose for 4 weeks., Disp: 2 mL, Rfl:  0   triamcinolone  ointment (KENALOG ) 0.5 %, Apply 1 Application topically 2 (two) times daily., Disp: 30 g, Rfl: 0   venlafaxine  XR (EFFEXOR -XR) 37.5 MG 24 hr capsule, Take 1 capsule by mouth once daily, Disp: 30 capsule, Rfl: 5   VENTOLIN  HFA 108 (90 Base) MCG/ACT inhaler, INHALE 2 PUFFS BY MOUTH EVERY 6 HOURS AS NEEDED FOR WHEEZING AND FOR SHORTNESS OF BREATH, Disp: 18 g, Rfl: 1   Vitamin D , Ergocalciferol , (DRISDOL ) 1.25 MG (50000 UNIT) CAPS capsule, Take 1 capsule (50,000 Units total) by mouth 2 (two) times a week., Disp: 24 capsule, Rfl: 1   [START ON 10/08/2023] amphetamine -dextroamphetamine (ADDERALL XR) 20 MG 24 hr capsule, Take 2 capsules (40 mg total) by mouth daily., Disp: 60 capsule, Rfl: 0   amphetamine -dextroamphetamine (ADDERALL XR) 20 MG 24 hr capsule, Take 2 capsules (40 mg total) by mouth daily., Disp: 60 capsule, Rfl: 0   cyanocobalamin  (VITAMIN B12) 1000 MCG/ML injection, Inject 1 mL (1,000 mcg total) into the muscle every 30 (thirty) days., Disp: 1 mL, Rfl: 6   cyclobenzaprine (FLEXERIL) 5 MG tablet, Take 5 mg by mouth at bedtime as needed for muscle spasms., Disp: , Rfl:    insulin degludec  (TRESIBA  FLEXTOUCH) 100 UNIT/ML FlexTouch Pen, INJECT 10 UNITS SUBCUTANEOUSLY AT BEDTIME. INCREASE BY 2 UNITS EVERY 4 DAYS FOR BLOOD SUGAR HIGHER THAN 150 FASTING IN MORNING. Max dose 60 units per dose. (Patient not taking: Reported on 09/22/2023), Disp: 15 mL, Rfl: 3   metroNIDAZOLE (FLAGYL) 500 MG tablet, Take 500 mg by mouth 3 (three) times daily. (Patient not taking: Reported on 09/22/2023), Disp: , Rfl:    predniSONE  (DELTASONE ) 20 MG tablet, Take by mouth. (Patient not taking: Reported on 09/22/2023), Disp: , Rfl:    riTUXimab -abbs (TRUXIMA ) 500 MG/50ML injection, 1000 MG Intravenous DAY 0 & DAY 15 THEN REPEAT COURSE EVERY 24 WEEKS THEREAFTER, Disp: , Rfl:    tirzepatide  (MOUNJARO ) 10 MG/0.5ML Pen, Inject 10 mg into the skin once a week. Start after completing 7.5mg  for 4 weeks. (Patient not  taking: Reported on 09/22/2023), Disp: 2 mL, Rfl: 0   tirzepatide  (MOUNJARO ) 5 MG/0.5ML Pen, Inject 5 mg into the skin once a week. (Patient not taking: Reported on 09/22/2023), Disp: 2 mL, Rfl: 0   traZODone  (DESYREL ) 100 MG tablet, Take 100 mg by mouth at bedtime. (Patient not taking: Reported on 09/22/2023), Disp: , Rfl:    valACYclovir  (VALTREX ) 1000 MG tablet, Take 2 tabs on day one and 1 tab a day on day 2,3,and 4 for outbreak of cold sore., Disp: 18 tablet, Rfl: 3   Vitamin D , Ergocalciferol , (DRISDOL ) 1.25 MG (50000 UNIT) CAPS capsule, Take 1 capsule (50,000 Units total) by mouth every 7 (seven) days., Disp: 12 capsule, Rfl: 3   No Known Allergies  Past Medical History:  Diagnosis Date   Absolute anemia 07/01/2023   Anxiety 08/21/2017   Arthritis    Asthma    CHF (congestive heart failure) (HCC)  Diabetes mellitus without complication (HCC)    Diverticulitis 06/09/2021   DVT (deep venous thrombosis) (HCC)    Graves disease    Grief reaction 02/02/2022   Hyperlipidemia    Hypertension    PCOS (polycystic ovarian syndrome)    Psychophysiological insomnia 02/02/2022   Rheumatoid arthritis (HCC)    Severe anxiety with panic 02/02/2022     Past Surgical History:  Procedure Laterality Date   CESAREAN SECTION     x2   LUMBAR FUSION     TUBAL LIGATION      Family History  Problem Relation Age of Onset   Heart attack Mother    Stroke Mother    Diabetes Mother    Hypertension Father    Colon polyps Father    Cancer - Other Sister 1       neuroblastoma   Lung cancer Maternal Grandfather        d. 39s   Breast cancer Paternal Grandmother    Ovarian cancer Paternal Grandmother    Breast cancer Paternal Aunt        dx 66s   Uterine cancer Other        MGM's sister; dx before 25    Social History   Tobacco Use   Smoking status: Former    Current packs/day: 1.00    Types: Cigarettes   Smokeless tobacco: Never  Vaping Use   Vaping status: Former   Substances:  Nicotine  Substance Use Topics   Alcohol use: No   Drug use: No    ROS Refer to HPI for ROS details.  Objective:   Vitals: BP 139/87 (BP Location: Left Arm)   Pulse 64   Temp (!) 97.4 F (36.3 C) (Oral)   Resp 14   LMP 08/14/2023 (Approximate)   SpO2 100%   Physical Exam Vitals and nursing note reviewed.  Constitutional:      General: She is not in acute distress.    Appearance: Normal appearance. She is not ill-appearing.  HENT:     Head: Normocephalic.  Cardiovascular:     Rate and Rhythm: Normal rate.  Pulmonary:     Effort: Pulmonary effort is normal. No respiratory distress.  Skin:    General: Skin is warm and dry.     Capillary Refill: Capillary refill takes less than 2 seconds.     Findings: Erythema and rash present. Rash is papular and urticarial.  Neurological:     General: No focal deficit present.     Mental Status: She is alert and oriented to person, place, and time.  Psychiatric:        Mood and Affect: Mood normal.        Behavior: Behavior normal.     Procedures  No results found for this or any previous visit (from the past 24 hours).  No results found.   Assessment and Plan :     Discharge Instructions       1. Rash and nonspecific skin eruption (Primary) - dexamethasone  (DECADRON ) IM injection 10 mg given in UC for acute allergic reaction/skin rash. - triamcinolone  ointment (KENALOG ) 0.5 %; Apply 1 Application topically 2 (two) times daily.  Dispense: 30 g; Refill: 0 -Continue to monitor symptoms for any change in severity if there is any escalation of current symptoms or development of new symptoms follow-up in ER for further evaluation and management.       Gale Hulse B Shaquel Josephson   Taiquan Campanaro, [place] B, [place] 09/22/23 1135

## 2023-09-22 NOTE — Discharge Instructions (Signed)
  1. Rash and nonspecific skin eruption (Primary) - dexamethasone  (DECADRON ) IM injection 10 mg given in UC for acute allergic reaction/skin rash. - triamcinolone  ointment (KENALOG ) 0.5 %; Apply 1 Application topically 2 (two) times daily.  Dispense: 30 g; Refill: 0 -Continue to monitor symptoms for any change in severity if there is any escalation of current symptoms or development of new symptoms follow-up in ER for further evaluation and management.

## 2023-09-22 NOTE — ED Triage Notes (Addendum)
 Pt reports rash to L arm that started yesterday morning. Started at L elbow and continued to move up the arm. Has also spread to R arm overnight. Notes severe itching and tender to touch. Irritated areas are slightly swollen and red. No environmental or household changes. Topical benadryl and hydrocortisone cream used with no relief. Pt has RA, currently taking prednisone  10mg  will taper to 7.5mg  tomorrow.

## 2023-09-29 ENCOUNTER — Other Ambulatory Visit (HOSPITAL_BASED_OUTPATIENT_CLINIC_OR_DEPARTMENT_OTHER): Payer: Self-pay | Admitting: Nurse Practitioner

## 2023-09-29 DIAGNOSIS — J4542 Moderate persistent asthma with status asthmaticus: Secondary | ICD-10-CM

## 2023-10-16 ENCOUNTER — Telehealth: Payer: Self-pay

## 2023-10-16 NOTE — Telephone Encounter (Signed)
*  Pulm  Pharmacy Patient Advocate Encounter   Received notification from Fax that prior authorization for Trelegy Ellipta  is required/requested.   Insurance verification completed.   The patient is insured through [employer] .   Per test claim: PA required; However, NEW/RECENT labs/notes are needed to complete & submit PA request. Please see below.  Patient seen 1+ year ago.

## 2023-10-16 NOTE — Telephone Encounter (Signed)
 Patient needs an office visit for PA to be submitted for inhaler.  Routing to front staff, please schedule pt appt as she is also overdue for one.

## 2023-10-17 NOTE — Telephone Encounter (Signed)
 Called PT no answer unable to leave VM sent LTR

## 2023-10-18 NOTE — Telephone Encounter (Signed)
 Pt has been scheduled. NFN

## 2023-10-20 ENCOUNTER — Other Ambulatory Visit: Payer: Self-pay | Admitting: Nurse Practitioner

## 2023-10-20 DIAGNOSIS — E1165 Type 2 diabetes mellitus with hyperglycemia: Secondary | ICD-10-CM

## 2023-11-08 ENCOUNTER — Other Ambulatory Visit (HOSPITAL_BASED_OUTPATIENT_CLINIC_OR_DEPARTMENT_OTHER): Payer: Self-pay | Admitting: Nurse Practitioner

## 2023-11-08 DIAGNOSIS — J4542 Moderate persistent asthma with status asthmaticus: Secondary | ICD-10-CM

## 2023-11-12 ENCOUNTER — Ambulatory Visit: Admitting: Neurology

## 2023-11-18 ENCOUNTER — Other Ambulatory Visit: Payer: Self-pay | Admitting: Nurse Practitioner

## 2023-11-18 DIAGNOSIS — E1165 Type 2 diabetes mellitus with hyperglycemia: Secondary | ICD-10-CM

## 2023-11-22 ENCOUNTER — Telehealth: Payer: Self-pay

## 2023-11-22 NOTE — Telephone Encounter (Signed)
 Keep apt on 11/3, we can order sleep study Bring machine along with SD card

## 2023-11-22 NOTE — Telephone Encounter (Signed)
 Called and spoke with the pt. Pt is aware to keep scheduled appt and to bring machine & SD card.  Nothing further needed.

## 2023-11-22 NOTE — Telephone Encounter (Signed)
 I could not find pt in [facility]. I called and spoke to pt. Pt states she is still using her machine however, she would like a ne one as she has had her machine for 8 years. Pt states she also lost 70 pounds and would like a new sleep study.   Pt states she has also broken her foot and may need surgery, but if she shows for her appt, she will bring the machine or SD card. If she can not make it, she will call to cancel.   FYI for BW to see what she would like to do.

## 2023-11-25 ENCOUNTER — Ambulatory Visit: Admitting: Primary Care

## 2023-11-25 DIAGNOSIS — J4542 Moderate persistent asthma with status asthmaticus: Secondary | ICD-10-CM

## 2023-11-25 DIAGNOSIS — G4733 Obstructive sleep apnea (adult) (pediatric): Secondary | ICD-10-CM

## 2023-11-25 NOTE — Progress Notes (Deleted)
 @Patient  ID: Melissa Massey, female    DOB: Jan 26, 1975, 48 y.o.   MRN: [id number]  No chief complaint on file.   Referring provider: Oris Camie BRAVO, NP  HPI 48 year old female, former smoker. PMH significant for CHF, HTN, asthma, multiple pulmonary nodules, OSA, fatty liver, GERD, hashimoto's disease, PCOS, type 2 diabetes, RA, ADHD, vit D deficiency, obesity.   Previous [facility] pulmonary encounter: 03/07/22- Dr. Neda  Has had sleep apnea for many years Using CPAP on a nightly basis  She was supposed to get a new machine recently but due to job change and insurance changes Was unable to get a new machine  Benefit from CPAP therapy Usually goes to bed between 10 and 11 Takes about 30 minutes to fall asleep About 2 awakenings Final wake up time about 7 AM  Weight has gradually come down, down about 25 pounds  She does have a history of rheumatoid arthritis, history of scleritis, Graves' disease, Hashimoto's -Was recently on as high as 80 mg of prednisone  which she is tapering down gradually, currently on 50 mg of prednisone   She does have a background history of asthma that has been poorly controlled She was on albuterol  and Breo, did not notice any significant improvement with the Breo, has been using albuterol  3-4 times a day  Does have a history of hypertension, diabetes, allergy and sinus problems Lumbar fusion surgery in the past  She was a former smoker quit about 10 years ago  Current machine is not working well, taped up  Works as a it consultant    11/25/2023 Discussed the use of AI scribe software for clinical note transcription with the patient, who gave verbal consent to proceed.  History of Present Illness   Compliance report not in airview Patient reports compliance with CPAP, current machine >8 years  She has lost 70 lbs and would like repeat sleep study  Bring machine along with SD card   No Known Allergies  Immunization History  Administered  Date(s) Administered   Influenza Split 11/22/2013   Influenza, Seasonal, Injecte, Preservative Fre 01/04/2023   Influenza,inj,quad, With Preservative 11/19/2017   Influenza,trivalent, recombinat, inj, PF 11/22/2013   Influenza-Unspecified 11/19/2017   PPD Test 06/01/2016   Tdap 06/27/2011, 01/04/2023    Past Medical History:  Diagnosis Date   Absolute anemia 07/01/2023   Anxiety 08/21/2017   Arthritis    Asthma    CHF (congestive heart failure) (HCC)    Diabetes mellitus without complication (HCC)    Diverticulitis 06/09/2021   DVT (deep venous thrombosis) (HCC)    Graves disease    Grief reaction 02/02/2022   Hyperlipidemia    Hypertension    PCOS (polycystic ovarian syndrome)    Psychophysiological insomnia 02/02/2022   Rheumatoid arthritis (HCC)    Severe anxiety with panic 02/02/2022    Tobacco History: Social History   Tobacco Use  Smoking Status Former   Current packs/day: 1.00   Types: Cigarettes  Smokeless Tobacco Never   Counseling given: Not Answered   Outpatient Medications Prior to Visit  Medication Sig Dispense Refill   albuterol  (PROVENTIL ) (2.5 MG/3ML) 0.083% nebulizer solution Take 3 mLs (2.5 mg total) by nebulization every 4 (four) hours as needed for wheezing or shortness of breath (please include nebulizer machine, hoses, and mask if needed.). 185 mL 6   amphetamine -dextroamphetamine (ADDERALL XR) 20 MG 24 hr capsule Take 2 capsules (40 mg total) by mouth daily. 60 capsule 0   amphetamine -dextroamphetamine (ADDERALL XR) 20 MG  24 hr capsule Take 2 capsules (40 mg total) by mouth daily. 60 capsule 0   amphetamine -dextroamphetamine (ADDERALL XR) 20 MG 24 hr capsule Take 2 capsules (40 mg total) by mouth daily. 60 capsule 0   atorvastatin  (LIPITOR) 10 MG tablet Take 1 tablet (10 mg total) by mouth daily. 90 tablet 0   celecoxib  (CELEBREX ) 200 MG capsule Take 1 capsule (200 mg total) by mouth 2 (two) times daily. 180 capsule 3   Continuous Glucose  Sensor (DEXCOM G7 SENSOR) MISC Apply one device every 10 days as directed for continuous glucose measurement. 3 each 11   cyanocobalamin  (VITAMIN B12) 1000 MCG/ML injection Inject 1 mL (1,000 mcg total) into the muscle every 30 (thirty) days. 1 mL 6   cyclobenzaprine (FLEXERIL) 5 MG tablet Take 5 mg by mouth at bedtime as needed for muscle spasms.     esomeprazole  (NEXIUM ) 40 MG capsule Take 1 capsule (40 mg total) by mouth 2 (two) times daily. 60 capsule 5   FLUoxetine  (PROZAC ) 40 MG capsule Take 1 capsule (40 mg total) by mouth daily. 90 capsule 1   fluticasone -salmeterol (ADVAIR) 100-50 MCG/ACT AEPB Inhale 1 puff into the lungs 2 (two) times daily. 1 each 3   Fluticasone -Umeclidin-Vilant (TRELEGY ELLIPTA ) 200-62.5-25 MCG/ACT AEPB Inhalation; Duration: 30 Days     glipiZIDE  (GLUCOTROL ) 5 MG tablet Take 1 tablet (5 mg total) by mouth 2 (two) times daily before a meal. 180 tablet 3   hydrochlorothiazide  (HYDRODIURIL ) 25 MG tablet Take 1 tablet (25 mg total) by mouth daily. 90 tablet 3   hydroxychloroquine (PLAQUENIL) 200 MG tablet Take 400 mg by mouth daily.     hydroxychloroquine (PLAQUENIL) 200 MG tablet Take 400 mg by mouth daily.     hydroxychloroquine (PLAQUENIL) 200 MG tablet Take 200 mg by mouth 2 (two) times daily.     insulin degludec  (TRESIBA  FLEXTOUCH) 100 UNIT/ML FlexTouch Pen INJECT 10 UNITS SUBCUTANEOUSLY AT BEDTIME. INCREASE BY 2 UNITS EVERY 4 DAYS FOR BLOOD SUGAR HIGHER THAN 150 FASTING IN MORNING. Max dose 60 units per dose. (Patient not taking: Reported on 09/22/2023) 15 mL 3   Iron , Ferrous Sulfate , 325 (65 Fe) MG TABS Take 325 mg by mouth daily. 90 tablet 1   levothyroxine  (SYNTHROID ) 125 MCG tablet Take 2 tablets (250 mcg total) by mouth daily. 180 tablet 1   liothyronine  (CYTOMEL ) 5 MCG tablet Take 1 tablet (5 mcg total) by mouth 2 (two) times daily. 180 tablet 1   losartan  (COZAAR ) 100 MG tablet Take 1 tablet (100 mg total) by mouth daily. 90 tablet 3   metFORMIN   (GLUCOPHAGE -XR) 500 MG 24 hr tablet TAKE 2 TABLETS BY MOUTH IN THE MORNING AND 2 TABLETS BY MOUTH AT BEDTIME 360 tablet 3   metroNIDAZOLE (FLAGYL) 500 MG tablet Take 500 mg by mouth 3 (three) times daily. (Patient not taking: Reported on 09/22/2023)     MOUNJARO  7.5 MG/0.5ML Pen INJECT 1 PEN SUBCUTANEOUSLY ONCE A WEEK 4 mL 0   ondansetron  (ZOFRAN ) 8 MG tablet Take 1 tablet (8 mg total) by mouth every 8 (eight) hours as needed for nausea or vomiting. 20 tablet 2   predniSONE  (DELTASONE ) 10 MG tablet Take 10 mg by mouth daily with breakfast.     predniSONE  (DELTASONE ) 20 MG tablet Take by mouth. (Patient not taking: Reported on 09/22/2023)     riTUXimab -abbs (TRUXIMA ) 500 MG/50ML injection 1000 MG Intravenous DAY 0 & DAY 15 THEN REPEAT COURSE EVERY 24 WEEKS THEREAFTER     tirzepatide  (MOUNJARO )  10 MG/0.5ML Pen Inject 10 mg into the skin once a week. Start after completing 7.5mg  for 4 weeks. (Patient not taking: Reported on 09/22/2023) 2 mL 0   tirzepatide  (MOUNJARO ) 5 MG/0.5ML Pen Inject 5 mg into the skin once a week. (Patient not taking: Reported on 09/22/2023) 2 mL 0   traZODone  (DESYREL ) 100 MG tablet Take 100 mg by mouth at bedtime. (Patient not taking: Reported on 09/22/2023)     triamcinolone  ointment (KENALOG ) 0.5 % Apply 1 Application topically 2 (two) times daily. 30 g 0   valACYclovir  (VALTREX ) 1000 MG tablet Take 2 tabs on day one and 1 tab a day on day 2,3,and 4 for outbreak of cold sore. 18 tablet 3   venlafaxine  XR (EFFEXOR -XR) 37.5 MG 24 hr capsule Take 1 capsule by mouth once daily 30 capsule 5   VENTOLIN  HFA 108 (90 Base) MCG/ACT inhaler INHALE 2 PUFFS BY MOUTH EVERY 6 HOURS AS NEEDED FOR WHEEZING AND FOR SHORTNESS OF BREATH 18 g 0   Vitamin D , Ergocalciferol , (DRISDOL ) 1.25 MG (50000 UNIT) CAPS capsule Take 1 capsule (50,000 Units total) by mouth every 7 (seven) days. 12 capsule 3   Vitamin D , Ergocalciferol , (DRISDOL ) 1.25 MG (50000 UNIT) CAPS capsule Take 1 capsule (50,000 Units total)  by mouth 2 (two) times a week. 24 capsule 1   No facility-administered medications prior to visit.      Review of Systems  Review of Systems   Physical Exam  There were no vitals taken for this visit. Physical Exam  ***  Lab Results:  CBC    Component Value Date/Time   WBC 9.6 01/04/2023 1151   WBC 10.9 (H) 07/09/2022 0850   WBC 7.0 07/31/2013 1345   RBC 4.19 01/04/2023 1151   RBC 4.31 07/09/2022 0850   HGB 13.1 01/04/2023 1151   HCT 39.5 01/04/2023 1151   PLT 362 01/04/2023 1151   MCV 94 01/04/2023 1151   MCH 31.3 01/04/2023 1151   MCH 28.3 07/09/2022 0850   MCHC 33.2 01/04/2023 1151   MCHC 32.2 07/09/2022 0850   RDW 13.3 01/04/2023 1151   LYMPHSABS 1.0 01/04/2023 1151   MONOABS 0.8 07/09/2022 0850   EOSABS 0.1 01/04/2023 1151   BASOSABS 0.0 01/04/2023 1151    BMET    Component Value Date/Time   NA 137 01/04/2023 1151   K 5.0 01/04/2023 1151   CL 100 01/04/2023 1151   CO2 22 01/04/2023 1151   GLUCOSE 231 (H) 01/04/2023 1151   GLUCOSE 80 07/31/2013 1845   BUN 12 01/04/2023 1151   CREATININE 0.75 01/04/2023 1151   CALCIUM  9.1 01/04/2023 1151   GFRNONAA 74 (L) 07/31/2013 1845   GFRAA 86 (L) 07/31/2013 1845    BNP No results found for: BNP  ProBNP    Component Value Date/Time   PROBNP 145.9 (H) 12/23/2012 0219    Imaging: No results found.   Assessment & Plan:   No problem-specific Assessment & Plan notes found for this encounter.   1. Obstructive sleep apnea syndrome, severe (Primary)  2. Asthma in adult, moderate persistent, with status asthmaticus   Assessment and Plan Assessment & Plan       I personally spent a total of *** minutes in the care of the patient today including {Time Based Coding:210964241}.   Almarie LELON Ferrari, NP 11/25/2023

## 2023-12-03 DIAGNOSIS — S93304A Unspecified dislocation of right foot, initial encounter: Secondary | ICD-10-CM | POA: Insufficient documentation

## 2023-12-03 DIAGNOSIS — E11610 Type 2 diabetes mellitus with diabetic neuropathic arthropathy: Secondary | ICD-10-CM | POA: Insufficient documentation

## 2023-12-09 ENCOUNTER — Other Ambulatory Visit: Payer: Self-pay | Admitting: Nurse Practitioner

## 2023-12-09 DIAGNOSIS — E1159 Type 2 diabetes mellitus with other circulatory complications: Secondary | ICD-10-CM

## 2023-12-25 NOTE — Progress Notes (Deleted)
 Covid: Flu: Pneumonia: Mammo: Colonoscopy: PAP:

## 2023-12-30 ENCOUNTER — Other Ambulatory Visit: Payer: Self-pay | Admitting: Nurse Practitioner

## 2023-12-30 DIAGNOSIS — E0500 Thyrotoxicosis with diffuse goiter without thyrotoxic crisis or storm: Secondary | ICD-10-CM

## 2023-12-30 DIAGNOSIS — E063 Autoimmune thyroiditis: Secondary | ICD-10-CM

## 2023-12-30 DIAGNOSIS — E1165 Type 2 diabetes mellitus with hyperglycemia: Secondary | ICD-10-CM

## 2023-12-31 ENCOUNTER — Encounter: Payer: Self-pay | Admitting: Nurse Practitioner

## 2024-01-06 ENCOUNTER — Other Ambulatory Visit (HOSPITAL_COMMUNITY): Payer: Self-pay

## 2024-01-12 ENCOUNTER — Other Ambulatory Visit: Payer: Self-pay | Admitting: Nurse Practitioner

## 2024-01-12 DIAGNOSIS — F339 Major depressive disorder, recurrent, unspecified: Secondary | ICD-10-CM

## 2024-01-12 DIAGNOSIS — G4710 Hypersomnia, unspecified: Secondary | ICD-10-CM

## 2024-01-13 ENCOUNTER — Other Ambulatory Visit (HOSPITAL_COMMUNITY): Payer: Self-pay

## 2024-01-13 ENCOUNTER — Telehealth: Payer: Self-pay | Admitting: Pharmacy Technician

## 2024-01-13 NOTE — Telephone Encounter (Signed)
 Left message for pt to call for appt  but she is scheduled for med management 02/27/2024

## 2024-01-13 NOTE — Telephone Encounter (Signed)
 Left message for pt to call and set up appt with Camie for med management/follow up

## 2024-01-13 NOTE — Telephone Encounter (Signed)
 Pharmacy Patient Advocate Encounter   Received notification from Onbase/CMM that prior authorization for Dexcom G7 Sensor is due for renewal.   Insurance verification completed.   The patient is insured through [facility] MEDICAID.  Action: PA required; PA started via CoverMyMeds. KEY [id number] . Please see clinical question(s) below that I am not finding the answer to in their chart and advise.  Has the beneficiary had a face-to-face encounter with the ordering practitioner to evaluate the efficacy of the CGM system no more than three (3) months prior to submission of this reauthorization request?   Pt needs an apt from within the last 3 months.

## 2024-01-24 ENCOUNTER — Other Ambulatory Visit: Payer: Self-pay | Admitting: Nurse Practitioner

## 2024-01-24 DIAGNOSIS — E1165 Type 2 diabetes mellitus with hyperglycemia: Secondary | ICD-10-CM

## 2024-02-07 ENCOUNTER — Other Ambulatory Visit: Payer: Self-pay | Admitting: Nurse Practitioner

## 2024-02-07 DIAGNOSIS — F339 Major depressive disorder, recurrent, unspecified: Secondary | ICD-10-CM

## 2024-02-07 DIAGNOSIS — G4710 Hypersomnia, unspecified: Secondary | ICD-10-CM

## 2024-02-07 NOTE — Telephone Encounter (Signed)
 Last appt. 07/01/23.

## 2024-02-18 ENCOUNTER — Other Ambulatory Visit (HOSPITAL_BASED_OUTPATIENT_CLINIC_OR_DEPARTMENT_OTHER): Payer: Self-pay | Admitting: Nurse Practitioner

## 2024-02-18 DIAGNOSIS — E1165 Type 2 diabetes mellitus with hyperglycemia: Secondary | ICD-10-CM

## 2024-02-18 DIAGNOSIS — I152 Hypertension secondary to endocrine disorders: Secondary | ICD-10-CM

## 2024-02-18 DIAGNOSIS — J4542 Moderate persistent asthma with status asthmaticus: Secondary | ICD-10-CM

## 2024-02-18 DIAGNOSIS — B009 Herpesviral infection, unspecified: Secondary | ICD-10-CM

## 2024-02-18 DIAGNOSIS — E063 Autoimmune thyroiditis: Secondary | ICD-10-CM

## 2024-02-18 DIAGNOSIS — E0500 Thyrotoxicosis with diffuse goiter without thyrotoxic crisis or storm: Secondary | ICD-10-CM

## 2024-02-18 DIAGNOSIS — E1139 Type 2 diabetes mellitus with other diabetic ophthalmic complication: Secondary | ICD-10-CM

## 2024-02-18 DIAGNOSIS — M0579 Rheumatoid arthritis with rheumatoid factor of multiple sites without organ or systems involvement: Secondary | ICD-10-CM

## 2024-02-18 NOTE — Telephone Encounter (Signed)
 Last appt. 07/01/23 not sure if pt. Needs to remain on Vit D, albuterol  inhaler already called in earlier today.

## 2024-02-20 ENCOUNTER — Other Ambulatory Visit (HOSPITAL_COMMUNITY): Payer: Self-pay

## 2024-02-26 NOTE — Progress Notes (Unsigned)
 Virtual Visit Encounter {telephone/mychart:25033} visit.   I connected with  Melissa Massey on 02/27/24 at  1:30 PM EST by secure {Video Enabled:26378::video and audio} telemedicine application. I verified that I am speaking with the correct person using two identifiers.   I introduced myself as a Publishing Rights Manager with the practice. The limitations of evaluation and management by telemedicine discussed with the patient and the availability of in person appointments. The patient expressed verbal understanding and consent to proceed.  Participating parties in this visit include: Myself and {Relatives of adult:5061}  The patient is: {Patient Location:[phone]::Home} I am: {Virtual Visit Location Provider:29685::Office/Clinic}  Subjective:    CC and HPI:   ***  Past medical history, Surgical history, Family history not pertinant except as noted below, Social history, Allergies, and medications have been entered into the medical record, reviewed, and corrections made.   Review of Systems:  All review of systems negative except what is listed in the HPI  Objective:    Alert and oriented x *** Speaking in clear sentences with no shortness of breath. No distress.  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 The patient was provided an opportunity to ask questions and all were answered. The patient agreed with the plan and demonstrated an understanding of the instructions.   The patient was advised to call back or seek an in-person evaluation if the symptoms worsen or if the condition fails to improve as anticipated.  Follow-Up: {plan; follow-up:11812}  I provided *** minutes of non-face-to-face interaction with this non face-to-face encounter including intake, same-day documentation, and chart review.   Camie CHARLENA Doing, NP , DNP, AGNP-c [facility]

## 2024-02-27 ENCOUNTER — Telehealth: Payer: Self-pay

## 2024-02-27 ENCOUNTER — Telehealth: Admitting: Nurse Practitioner

## 2024-02-27 ENCOUNTER — Other Ambulatory Visit (HOSPITAL_COMMUNITY): Payer: Self-pay

## 2024-02-27 DIAGNOSIS — E1169 Type 2 diabetes mellitus with other specified complication: Secondary | ICD-10-CM

## 2024-02-27 DIAGNOSIS — E11610 Type 2 diabetes mellitus with diabetic neuropathic arthropathy: Secondary | ICD-10-CM

## 2024-02-27 DIAGNOSIS — E1159 Type 2 diabetes mellitus with other circulatory complications: Secondary | ICD-10-CM

## 2024-02-27 DIAGNOSIS — B9689 Other specified bacterial agents as the cause of diseases classified elsewhere: Secondary | ICD-10-CM

## 2024-02-27 DIAGNOSIS — F900 Attention-deficit hyperactivity disorder, predominantly inattentive type: Secondary | ICD-10-CM

## 2024-02-27 DIAGNOSIS — B3731 Acute candidiasis of vulva and vagina: Secondary | ICD-10-CM

## 2024-02-27 DIAGNOSIS — E1165 Type 2 diabetes mellitus with hyperglycemia: Secondary | ICD-10-CM

## 2024-02-27 DIAGNOSIS — J4542 Moderate persistent asthma with status asthmaticus: Secondary | ICD-10-CM

## 2024-02-27 DIAGNOSIS — E0500 Thyrotoxicosis with diffuse goiter without thyrotoxic crisis or storm: Secondary | ICD-10-CM

## 2024-02-27 DIAGNOSIS — I509 Heart failure, unspecified: Secondary | ICD-10-CM

## 2024-02-27 DIAGNOSIS — E1139 Type 2 diabetes mellitus with other diabetic ophthalmic complication: Secondary | ICD-10-CM

## 2024-02-27 DIAGNOSIS — M0579 Rheumatoid arthritis with rheumatoid factor of multiple sites without organ or systems involvement: Secondary | ICD-10-CM

## 2024-02-27 DIAGNOSIS — E063 Autoimmune thyroiditis: Secondary | ICD-10-CM

## 2024-02-27 DIAGNOSIS — G4710 Hypersomnia, unspecified: Secondary | ICD-10-CM

## 2024-02-27 MED ORDER — AMPHETAMINE-DEXTROAMPHET ER 20 MG PO CP24: 40.0000 mg | ORAL_CAPSULE | Freq: Every day | ORAL | 0 refills | Status: AC

## 2024-02-27 MED ORDER — DEXAMETHASONE 1.5 MG (27) PO TBPK: ORAL_TABLET | ORAL | 0 refills | Status: DC

## 2024-02-27 MED ORDER — LOSARTAN POTASSIUM 100 MG PO TABS: 100.0000 mg | ORAL_TABLET | Freq: Every day | ORAL | 3 refills | Status: AC

## 2024-02-27 MED ORDER — TRELEGY ELLIPTA 200-62.5-25 MCG/ACT IN AEPB: INHALATION_SPRAY | RESPIRATORY_TRACT | 6 refills | Status: AC

## 2024-02-27 MED ORDER — LEVOTHYROXINE SODIUM 125 MCG PO TABS: 250.0000 ug | ORAL_TABLET | Freq: Every day | ORAL | 3 refills | Status: AC

## 2024-02-27 MED ORDER — LIOTHYRONINE SODIUM 5 MCG PO TABS: 5.0000 ug | ORAL_TABLET | Freq: Two times a day (BID) | ORAL | 3 refills | Status: AC

## 2024-02-27 MED ORDER — AMOXICILLIN-POT CLAVULANATE 875-125 MG PO TABS: 1.0000 | ORAL_TABLET | Freq: Two times a day (BID) | ORAL | 0 refills | Status: AC

## 2024-02-27 MED ORDER — GLIPIZIDE 5 MG PO TABS: 5.0000 mg | ORAL_TABLET | Freq: Two times a day (BID) | ORAL | 3 refills | Status: AC

## 2024-02-27 MED ORDER — TIRZEPATIDE 5 MG/0.5ML ~~LOC~~ SOAJ: 5.0000 mg | SUBCUTANEOUS | 0 refills | Status: AC

## 2024-02-27 MED ORDER — METFORMIN HCL ER 500 MG PO TB24: ORAL_TABLET | ORAL | 3 refills | Status: AC

## 2024-02-27 MED ORDER — TIRZEPATIDE 2.5 MG/0.5ML ~~LOC~~ SOAJ: 2.5000 mg | SUBCUTANEOUS | 0 refills | Status: AC

## 2024-02-27 MED ORDER — IPRATROPIUM-ALBUTEROL 0.5-2.5 (3) MG/3ML IN SOLN: 3.0000 mL | RESPIRATORY_TRACT | 6 refills | Status: AC | PRN

## 2024-02-27 MED ORDER — ATORVASTATIN CALCIUM 10 MG PO TABS: 10.0000 mg | ORAL_TABLET | Freq: Every day | ORAL | 0 refills | Status: AC

## 2024-02-27 MED ORDER — FLUCONAZOLE 150 MG PO TABS: ORAL_TABLET | ORAL | 2 refills | Status: AC

## 2024-02-27 MED ORDER — FLUTICASONE PROPIONATE 50 MCG/ACT NA SUSP: 2.0000 | Freq: Every day | NASAL | 6 refills | Status: AC

## 2024-02-27 NOTE — Assessment & Plan Note (Signed)
 SABRA

## 2024-02-27 NOTE — Telephone Encounter (Signed)
 Pharmacy Patient Advocate Encounter   Received notification from [facility] KEY that prior authorization for MOUNJARO  2.5 MG/0.5ML is required/requested.   Insurance verification completed.   The patient is insured through [employer].   Per test claim: Refill too soon. PA is not needed at this time. Medication was filled 02/18/2024. Next eligible fill date is 03/10/2024.

## 2024-02-27 NOTE — Assessment & Plan Note (Signed)
 Melissa Massey

## 2024-02-28 ENCOUNTER — Telehealth: Payer: Self-pay | Admitting: Internal Medicine

## 2024-02-28 ENCOUNTER — Other Ambulatory Visit (HOSPITAL_COMMUNITY): Payer: Self-pay

## 2024-02-28 ENCOUNTER — Telehealth: Payer: Self-pay

## 2024-02-28 DIAGNOSIS — J4542 Moderate persistent asthma with status asthmaticus: Secondary | ICD-10-CM

## 2024-02-28 MED ORDER — METHYLPREDNISOLONE 4 MG PO TBPK: ORAL_TABLET | ORAL | 1 refills | Status: AC

## 2024-02-28 NOTE — Telephone Encounter (Signed)
 Copied from CRM [id number]. Topic: Clinical - Prescription Issue >> Feb 27, 2024  2:33 PM Roselie BROCKS wrote: Reason for CRM: [facility] states the medication  dexAMETHasone  1.5 MG (27) TBPK comes as loose tablets and they need a new prescription to put actual taper  directions on the medication.  [facility] phone number [phone]

## 2024-02-28 NOTE — Telephone Encounter (Signed)
 Pharmacy Patient Advocate Encounter   Received notification from [facility] KEY that prior authorization for Trelegy Ellipta  [id number] is required/requested.   Insurance verification completed.   The patient is insured through [facility] MEDICAID.   Per test claim: PA required; PA submitted to above mentioned insurance via Latent Key/confirmation #/[id number] Status is pending

## 2024-07-13 ENCOUNTER — Encounter: Payer: Self-pay | Admitting: Nurse Practitioner
